# Patient Record
Sex: Female | Born: 1935 | Race: White | Hispanic: No | Marital: Married | Smoking: Never smoker
Health system: Southern US, Community
[De-identification: ages and names within clinical notes are randomized; demographics above are authoritative.]

## PROBLEM LIST (undated history)

## (undated) DIAGNOSIS — M25519 Pain in unspecified shoulder: Secondary | ICD-10-CM

## (undated) DIAGNOSIS — I3139 Other pericardial effusion (noninflammatory): Secondary | ICD-10-CM

## (undated) DIAGNOSIS — K219 Gastro-esophageal reflux disease without esophagitis: Secondary | ICD-10-CM

## (undated) DIAGNOSIS — I351 Nonrheumatic aortic (valve) insufficiency: Secondary | ICD-10-CM

## (undated) DIAGNOSIS — R030 Elevated blood-pressure reading, without diagnosis of hypertension: Secondary | ICD-10-CM

## (undated) DIAGNOSIS — E785 Hyperlipidemia, unspecified: Secondary | ICD-10-CM

## (undated) DIAGNOSIS — I447 Left bundle-branch block, unspecified: Secondary | ICD-10-CM

## (undated) DIAGNOSIS — I313 Pericardial effusion (noninflammatory): Secondary | ICD-10-CM

## (undated) HISTORY — DX: Nonrheumatic aortic (valve) insufficiency: I35.1

## (undated) HISTORY — DX: Other pericardial effusion (noninflammatory): I31.39

## (undated) HISTORY — DX: Pain in unspecified shoulder: M25.519

## (undated) HISTORY — DX: Elevated blood-pressure reading, without diagnosis of hypertension: R03.0

## (undated) HISTORY — DX: Gastro-esophageal reflux disease without esophagitis: K21.9

## (undated) HISTORY — DX: Left bundle-branch block, unspecified: I44.7

## (undated) HISTORY — DX: Hyperlipidemia, unspecified: E78.5

## (undated) HISTORY — PX: MELANOMA EXCISION: SHX5266

## (undated) HISTORY — DX: Pericardial effusion (noninflammatory): I31.3

---

## 1981-10-30 HISTORY — PX: BREAST BIOPSY: SHX20

## 1982-10-30 HISTORY — PX: CHOLECYSTECTOMY: SHX55

## 1991-10-31 HISTORY — PX: NASAL SEPTUM SURGERY: SHX37

## 1998-10-30 HISTORY — PX: 25 GAUGE PARS PLANA VITRECTOMY WITH 20 GAUGE MVR PORT FOR MACULAR HOLE: SHX6096

## 2000-10-30 HISTORY — PX: ROTATOR CUFF REPAIR: SHX139

## 2006-08-15 ENCOUNTER — Ambulatory Visit: Payer: Self-pay | Admitting: Cardiology

## 2006-08-15 ENCOUNTER — Observation Stay (HOSPITAL_COMMUNITY): Admission: EM | Admit: 2006-08-15 | Discharge: 2006-08-16 | Payer: Self-pay | Admitting: Emergency Medicine

## 2006-08-30 ENCOUNTER — Ambulatory Visit: Payer: Self-pay

## 2006-08-30 ENCOUNTER — Encounter: Payer: Self-pay | Admitting: Cardiology

## 2006-09-07 ENCOUNTER — Ambulatory Visit: Payer: Self-pay | Admitting: Cardiology

## 2007-09-13 ENCOUNTER — Ambulatory Visit: Payer: Self-pay | Admitting: Cardiology

## 2008-03-06 ENCOUNTER — Encounter: Admission: RE | Admit: 2008-03-06 | Discharge: 2008-03-06 | Payer: Self-pay | Admitting: Family Medicine

## 2008-08-11 ENCOUNTER — Encounter: Admission: RE | Admit: 2008-08-11 | Discharge: 2008-08-11 | Payer: Self-pay | Admitting: Family Medicine

## 2008-08-16 ENCOUNTER — Encounter: Admission: RE | Admit: 2008-08-16 | Discharge: 2008-08-16 | Payer: Self-pay | Admitting: Family Medicine

## 2008-09-18 ENCOUNTER — Ambulatory Visit: Payer: Self-pay | Admitting: Cardiology

## 2008-10-06 ENCOUNTER — Ambulatory Visit: Payer: Self-pay

## 2008-10-06 ENCOUNTER — Encounter: Payer: Self-pay | Admitting: Cardiology

## 2009-01-04 ENCOUNTER — Ambulatory Visit: Payer: Self-pay | Admitting: Cardiology

## 2010-01-03 ENCOUNTER — Encounter: Payer: Self-pay | Admitting: Cardiology

## 2010-01-03 DIAGNOSIS — I447 Left bundle-branch block, unspecified: Secondary | ICD-10-CM | POA: Insufficient documentation

## 2010-01-04 ENCOUNTER — Ambulatory Visit: Payer: Self-pay | Admitting: Cardiology

## 2010-01-06 ENCOUNTER — Telehealth (INDEPENDENT_AMBULATORY_CARE_PROVIDER_SITE_OTHER): Payer: Self-pay | Admitting: *Deleted

## 2010-01-10 ENCOUNTER — Ambulatory Visit: Payer: Self-pay | Admitting: Cardiology

## 2010-01-10 ENCOUNTER — Encounter (HOSPITAL_COMMUNITY): Admission: RE | Admit: 2010-01-10 | Discharge: 2010-03-01 | Payer: Self-pay | Admitting: Cardiology

## 2010-01-10 ENCOUNTER — Ambulatory Visit: Payer: Self-pay

## 2010-02-12 ENCOUNTER — Encounter: Payer: Self-pay | Admitting: Cardiology

## 2010-02-14 ENCOUNTER — Ambulatory Visit: Payer: Self-pay | Admitting: Cardiology

## 2010-11-29 NOTE — Progress Notes (Signed)
Summary: Nuclear Pre-Procedure  Phone Note Outgoing Call   Call placed by: Milana Na, EMT-P,  January 06, 2010 10:30 AM Summary of Call: Left message with information on Myoview Information Sheet (see scanned document for details).      Nuclear Med Background Indications for Stress Test: Evaluation for Ischemia   History: Echo  History Comments: '09 ECHO NL LVF Mild AI  Symptoms: Chest Pain  Symptoms Comments: Radiating to the jaw   Nuclear Pre-Procedure Cardiac Risk Factors: LBBB Height (in): 66  Nuclear Med Study Referring MD:  [employer]

## 2010-11-29 NOTE — Assessment & Plan Note (Signed)
Summary: Cardiology Nuclear Study  Nuclear Med Background Indications for Stress Test: Evaluation for Ischemia   History: Echo  History Comments: '09 ECHO NL LVF Mild AI  Symptoms: Chest Pain  Symptoms Comments: Radiating to the jaw   Nuclear Pre-Procedure Cardiac Risk Factors: LBBB Caffeine/Decaff Intake: None NPO After: 8:00 PM Lungs: clear IV 0.9% NS with Angio Cath: 22g     IV Site: (R) AC IV Started by: Irean Hong RN Chest Size (in) 36     Cup Size B     Height (in): 66 Weight (lb): 134 BMI: 21.71 Tech Comments: Manual cuff BP (R) 150/90, (L) 140/80. Patient's automatic BP cuff (R) 138/89, (L) 140/92.  Recheck manual BP cuff (L) 140/84, patient's automatic BP cuff (L) 140/88.Patsy Edwards,RN.  Nuclear Med Study 1 or 2 day study:  1 day     Stress Test Type:  Adenosine Reading MD:  Willa Rough, MD     Referring MD:  J.Kloi Brodman Resting Radionuclide:  Technetium 65m Tetrofosmin     Resting Radionuclide Dose:  11.0 mCi  Stress Radionuclide:  Technetium 23m Tetrofosmin     Stress Radionuclide Dose:  33.0 mCi   Stress Protocol  Dose of Adenosine:  34.1 mg    Stress Test Technologist:  Milana Na EMT-P     Nuclear Technologist:  Burna Mortimer Deal RT-N  Rest Procedure  Myocardial perfusion imaging was performed at rest 45 minutes following the intravenous administration of Myoview Technetium 36m Tetrofosmin.  Stress Procedure  The patient received IV adenosine at 140 mcg/kg/min for 4 minutes. There were no significant changes with infusion.  2nd degree avb with infusion. Myoview was injected at the 2 minute mark and quantitative spect images were obtained after a 45 minute delay.  QPS Raw Data Images:  Patient motion noted; appropriate software correction applied. Stress Images:  There is normal uptake in all areas. Rest Images:  Normal homogeneous uptake in all areas of the myocardium. Subtraction (SDS):  No evidence of ischemia. Transient Ischemic Dilatation:  1.07   (Normal <1.22)  Lung/Heart Ratio:  .29  (Normal <0.45)  Quantitative Gated Spect Images QGS EDV:  81 ml QGS ESV:  29 ml QGS EF:  64 % QGS cine images:  Normal motion  Findings Normal nuclear study      Overall Impression  Exercise Capacity: Adenosine study with no exercise. BP Response: Normal blood pressure response. Clinical Symptoms: chest pressure ECG Impression: LBBB Overall Impression: Normal stress nuclear study.  Appended Document: Cardiology Nuclear Study Good result.  Appended Document: Cardiology Nuclear Study pt aware

## 2010-11-29 NOTE — Miscellaneous (Signed)
  Clinical Lists Changes  Problems: Added new problem of GERD (ICD-530.81) Added new problem of LBBB (ICD-426.3) Added new problem of AORTIC INSUFFICIENCY (ICD-424.1) Added new problem of CHEST PAIN (ICD-786.50) Added new problem of PERICARDIAL EFFUSION (ICD-423.9) Added new problem of * BLOOD PRESSURE Observations: Added new observation of PAST MED HX: GERD LBBB EF  55%  /  55-60%...echo...09/2008...septal dyssynergy AI  mild...echo...08/2006  /  echo...09/2008...mild AI cholecystectomy. left scapular pain...Marland KitchenMarland Kitchenappeared to be radicular in past Question of hypertension.  Her blood pressure historically has     always been quite good.  It appears that it is somewhat elevated     today but after relaxing in the office it was only high normal. Chest discomfort...not cardiac in past Pericardial effusion....echo   09/2008...small...incidental (01/03/2010 13:04) Added new observation of PRIMARY MD: Jarome Matin, MD (01/03/2010 13:04)       Past History:  Past Medical History: GERD LBBB EF  55%  /  55-60%...echo...09/2008...septal dyssynergy AI  mild...echo...08/2006  /  echo...09/2008...mild AI cholecystectomy. left scapular pain...Marland KitchenMarland Kitchenappeared to be radicular in past Question of hypertension.  Her blood pressure historically has     always been quite good.  It appears that it is somewhat elevated     today but after relaxing in the office it was only high normal. Chest discomfort...not cardiac in past Pericardial effusion....echo   09/2008...small...incidental

## 2010-11-29 NOTE — Assessment & Plan Note (Signed)
Summary: f1y   Visit Type:  Follow-up Primary Provider:  Jarome Matin, MD  CC:  chest pain.  History of Present Illness: The patient is seen for followup of chest pain.  She has some chest pain that is random.  At times she has discomfort in her jaw.  We know from the past that she has good left ventricular function with mild aortic insufficiency by echo December, 2009.  She has not had any type of exercise test in the past several years.  She does not have nausea vomiting or shortness of breath with her chest discomfort.  Current Medications (verified): 1)  Fosamax 70 Mg Tabs (Alendronate Sodium) .Marland Kitchen.. 1  Tab Q Weekly 2)  Aspirin 81 Mg Tbec (Aspirin) .... Take One Tablet By Mouth Daily 3)  Multivitamins   Tabs (Multiple Vitamin) .Marland Kitchen.. 1 Tab Once Daily 4)  Calicim W/vit D .... 500mg - 1 Tab Three Times A Day 5)  Sea Cucumber Vitamin 500mg  .... 1 Tab Once Daily 6)  Prilosec 20 Mg Cpdr (Omeprazole) .Marland Kitchen.. 1 Tab  Every Other Day 7)  Alprazolam 0.5 Mg Tabs (Alprazolam) .... As Needed 8)  Fish Oil   Oil (Fish Oil) 750mg  .... 1 Time A Day  Allergies (verified): No Known Drug Allergies  Past History:  Past Medical History: GERD LBBB EF  55%  /  55-60%...echo...09/2008...septal dyssynergy AI  mild...echo...08/2006  /  echo...09/2008...mild AI cholecystectomy. left scapular pain...Marland KitchenMarland Kitchenappeared to be radicular in past Question of hypertension.  Her blood pressure historically has     always been quite good.  It appears that it is somewhat elevated     today but after relaxing in the office it was only high normal. Chest discomfort.. Pericardial effusion....echo   09/2008...small...incidental  Review of Systems       Patient denies fever, chills, headache, sweats, rash, change in vision, change in hearing, shortness of breath, cough, nausea vomiting, urinary symptoms.  All other systems are reviewed and are negative.  Vital Signs:  Patient profile:   75 year old female Height:      66  inches Weight:      135 pounds BMI:     21.87 Pulse rate:   70 / minute BP sitting:   170 / 86  (left arm) Cuff size:   regular  Vitals Entered By: Burnett Kanaris, CNA (January 04, 2010 9:27 AM)  Physical Exam  General:  patient is stable in general. Head:  head is atraumatic. Eyes:  no xanthelasma. Neck:  no jugular venous distention. Chest Wall:  no chest wall tenderness. Lungs:  lungs are clear.  Respiratory effort is not labored. Heart:  cardiac exam reveals an S1 and S2.  There are no clicks or significant murmurs. Abdomen:  abdomen is soft. Msk:  no musculoskeletal deformities. Extremities:  no peripheral edema. Skin:  no skin rashes. Psych:  patient is oriented to person time and place.  Affect is normal.   Impression & Recommendations:  Problem # 1:  * BLOOD PRESSURE Systolic blood pressure is elevated today.  In the past her pressure has been normal at home when elevated in the office.  She will start to take her blood pressure at home and she will bring her blood pressure cuff in for calibration when she returns for her stress test  Problem # 2:  PERICARDIAL EFFUSION (ICD-423.9) Patient had a pericardial effusion there was trivial in the past.  There is no indication for follow up echo at this time.  This  Problem # 3:  CHEST PAIN (ICD-786.50)  Her updated medication list for this problem includes:    Aspirin 81 Mg Tbec (Aspirin) .Marland Kitchen... Take one tablet by mouth daily  Orders: Nuclear Stress Test (Nuc Stress Test) EKG w/ Interpretation (93000)  The patient has some ongoing chest discomfort.  She has left bundle branch block.  This is old.  EKG is done today and reviewed by me.  There is normal sinus rhythm with left bundle branch block.  I feel that we should proceed with nuclear exercise testing.  Adenosine Myoview is the most appropriate study in this patient with left bundle branch block.  The patient also will be checking with her dentist as she has some jaw  discomfort at times.  Problem # 4:  AORTIC INSUFFICIENCY (ICD-424.1) There is no murmur heard today.  She does not need a followup echo at this time.  Problem # 5:  GERD (ICD-530.81)  Her updated medication list for this problem includes:    Prilosec 20 Mg Cpdr (Omeprazole) .Marland Kitchen... 1 tab  every other day Of Her Symptoms May Well Be Related to GERD.  I Will Not Be Changing Her Meds in This Regard.  Other Orders: Misc. Referral (Misc. Ref)  Patient Instructions: 1)  Your physician has requested that you have an adenosine myoview.  For further information please visit [email or web address]  Please follow instruction sheet, as given. 2)  Nurse Visit to compare home BP cuff 3)  Follow up in 6 weeks

## 2010-11-29 NOTE — Assessment & Plan Note (Signed)
Summary: check bp/also check cuff/saf  Nurse Visit   Vital Signs:  Patient profile:   75 year old female Height:      66 inches Weight:      135 pounds BMI:     21.87 BP sitting:   140 / 80  (left arm) Cuff size:   regular  Impression & Recommendations:  Problem # 1:  * BLOOD PRESSURE MANUAL CUFF RA 150/90 LA140/80  PT'S CUFF RA 138/89 LA140/92 WILL FORWARD TO DR Henrietta Hoover DESK TOP FOR REVIEW    Current Medications (verified): 1)  Fosamax 70 Mg Tabs (Alendronate Sodium) .Marland Kitchen.. 1  Tab Q Weekly 2)  Aspirin 81 Mg Tbec (Aspirin) .... Take One Tablet By Mouth Daily 3)  Multivitamins   Tabs (Multiple Vitamin) .Marland Kitchen.. 1 Tab Once Daily 4)  Calicim W/vit D .... 500mg - 1 Tab Three Times A Day 5)  Sea Cucumber Vitamin 500mg  .... 1 Tab Once Daily 6)  Prilosec 20 Mg Cpdr (Omeprazole) .Marland Kitchen.. 1 Tab  Every Other Day 7)  Alprazolam 0.5 Mg Tabs (Alprazolam) .... As Needed 8)  Vitamin D 50,000 Iu .Marland Kitchen.. 1 Every Month 9)  Omega-3 750 Mg .Marland Kitchen.. 1 Once Daily  Allergies: No Known Drug Allergies   Patient Instructions: 1)  Your physician recommends that you schedule a follow-up appointment in: AS SCHEDULED  2)  Your physician has requested that you regularly monitor and record your blood pressure readings at home.  Please use the same machine at the same time of day to check your readings and record them to bring to your follow-up visit. IF CONSISTENTLY RUNS 140/85 OR ABOVE  CALL OFFICE 3)  Your physician recommends that you continue on your current medications as directed. Please refer to the Current Medication list given to you today.

## 2010-11-29 NOTE — Assessment & Plan Note (Signed)
Summary: per check out/sf   Visit Type:  Follow-up Primary Provider:  Jarome Matin, MD  CC:  chest pain.  History of Present Illness: The patient is seen for followup of chest pain.  Also because her blood pressure was mildly elevated at the time of her last visit, she brought a list of blood pressures for the past month.  It is very clear that her blood pressure is normal at home.  No further evaluation is needed.  She has not had any recurring chest pain.  Because she has a left bundle branch block her nuclear study was done using adenosine.  The study was dated January 10, 2010.  It was normal.  There was no scar or ischemia.  Current Medications (verified): 1)  Fosamax 70 Mg Tabs (Alendronate Sodium) .Marland Kitchen.. 1  Tab Q Weekly 2)  Aspirin 81 Mg Tbec (Aspirin) .... Take One Tablet By Mouth Daily 3)  Multivitamins   Tabs (Multiple Vitamin) .Marland Kitchen.. 1 Tab Once Daily 4)  Calicim W/vit D .... 770mg  W/ 1000 Vit D3 -- Three Times A Day 5)  Sea Cucumber Vitamin 500mg  .... 1 Tab Once Daily 6)  Prilosec 20 Mg Cpdr (Omeprazole) .Marland Kitchen.. 1 Tab  Every Other Day 7)  Alprazolam 0.5 Mg Tabs (Alprazolam) .... As Needed 8)  Vitamin D 50,000 Iu .Marland Kitchen.. 1 Every Month 9)  Omega-3 750 Mg .Marland Kitchen.. 1 Once Daily 10)  Caltrate 600+d Plus 600-400 Mg-Unit Chew (Calcium Carbonate-Vit D-Min) .... Once Daily  Allergies (verified): No Known Drug Allergies  Past History:  Past Medical History: GERD LBBB EF  55%  /  55-60%...echo...09/2008...septal dyssynergy AI  mild...echo...08/2006  /  echo...09/2008...mild AI cholecystectomy. left scapular pain...Marland KitchenMarland Kitchenappeared to be radicular in past Question of hypertension.  Her blood pressure historically has     always been quite good.  It appears that it is somewhat elevated     today but after relaxing in the office it was only high normal.  /  resolved February 14, 2010, recorded blood pressures from home for one month are all normal. Chest  pain...nuclear.Marland KitchenMarland Kitchen3/14/2011...normal...64% Pericardial effusion....echo   09/2008...small...incidental  Review of Systems       Patient denies fever, chills, headache, sweats, rash, change in vision, change in hearing, chest pain, cough, nausea vomiting, urinary symptoms.  Vital Signs:  Patient profile:   75 year old female Height:      66 inches Weight:      134 pounds BMI:     21.71 Pulse rate:   65 / minute BP sitting:   124 / 66  (left arm) Cuff size:   regular  Vitals Entered By: Hardin Negus, RMA (February 14, 2010 9:27 AM)  Physical Exam  General:  patient is stable. Eyes:  no xanthelasma. Neck:  no jugular venous distention. Lungs:  lungs are clear.  Respiratory effort is nonlabored. Heart:  cardiac exam reveals S1 and S2.  No clicks or significant murmurs. Abdomen:  abdomen soft. Extremities:  no peripheral edema. Psych:  patient is oriented to person time and place.  Affect is normal.   Impression & Recommendations:  Problem # 1:  * BLOOD PRESSURE Blood pressure is normal.  As outlined in the history of present illness her blood pressure is normal at home.  No further workup recommended.  Problem # 2:  AORTIC INSUFFICIENCY (ICD-424.1) I do not hear any significant aortic insufficiency today.  No further workup at this time.  Problem # 3:  CHEST PAIN (ICD-786.50)  Her updated medication  list for this problem includes:    Aspirin 81 Mg Tbec (Aspirin) .Marland Kitchen... Take one tablet by mouth daily Her nuclear study is normal.  No further workup.  Patient Instructions: 1)  Follow up in 1 year

## 2010-11-29 NOTE — Miscellaneous (Signed)
  Clinical Lists Changes  Observations: Added new observation of PAST MED HX: GERD LBBB EF  55%  /  55-60%...echo...09/2008...septal dyssynergy AI  mild...echo...08/2006  /  echo...09/2008...mild AI cholecystectomy. left scapular pain...Marland KitchenMarland Kitchenappeared to be radicular in past Question of hypertension.  Her blood pressure historically has     always been quite good.  It appears that it is somewhat elevated     today but after relaxing in the office it was only high normal. Chest pain...nuclear.Marland KitchenMarland Kitchen3/14/2011...normal...64% Pericardial effusion....echo   09/2008...small...incidental (02/12/2010 14:41) Added new observation of PRIMARY MD: Jarome Matin, MD (02/12/2010 14:41)       Past History:  Past Medical History: GERD LBBB EF  55%  /  55-60%...echo...09/2008...septal dyssynergy AI  mild...echo...08/2006  /  echo...09/2008...mild AI cholecystectomy. left scapular pain...Marland KitchenMarland Kitchenappeared to be radicular in past Question of hypertension.  Her blood pressure historically has     always been quite good.  It appears that it is somewhat elevated     today but after relaxing in the office it was only high normal. Chest pain...nuclear.Marland KitchenMarland Kitchen3/14/2011...normal...64% Pericardial effusion....echo   09/2008...small...incidental

## 2011-02-17 ENCOUNTER — Encounter: Payer: Self-pay | Admitting: Cardiology

## 2011-02-17 DIAGNOSIS — I313 Pericardial effusion (noninflammatory): Secondary | ICD-10-CM | POA: Insufficient documentation

## 2011-02-17 DIAGNOSIS — R072 Precordial pain: Secondary | ICD-10-CM | POA: Insufficient documentation

## 2011-02-17 DIAGNOSIS — R079 Chest pain, unspecified: Secondary | ICD-10-CM | POA: Insufficient documentation

## 2011-02-17 DIAGNOSIS — I351 Nonrheumatic aortic (valve) insufficiency: Secondary | ICD-10-CM | POA: Insufficient documentation

## 2011-02-17 DIAGNOSIS — I3139 Other pericardial effusion (noninflammatory): Secondary | ICD-10-CM | POA: Insufficient documentation

## 2011-02-17 DIAGNOSIS — R9430 Abnormal result of cardiovascular function study, unspecified: Secondary | ICD-10-CM | POA: Insufficient documentation

## 2011-02-17 DIAGNOSIS — M25519 Pain in unspecified shoulder: Secondary | ICD-10-CM | POA: Insufficient documentation

## 2011-02-18 ENCOUNTER — Encounter: Payer: Self-pay | Admitting: Cardiology

## 2011-02-20 ENCOUNTER — Encounter: Payer: Self-pay | Admitting: Cardiology

## 2011-02-20 ENCOUNTER — Ambulatory Visit (INDEPENDENT_AMBULATORY_CARE_PROVIDER_SITE_OTHER): Payer: Medicare Other | Admitting: Cardiology

## 2011-02-20 DIAGNOSIS — K219 Gastro-esophageal reflux disease without esophagitis: Secondary | ICD-10-CM | POA: Insufficient documentation

## 2011-02-20 DIAGNOSIS — I319 Disease of pericardium, unspecified: Secondary | ICD-10-CM

## 2011-02-20 DIAGNOSIS — I313 Pericardial effusion (noninflammatory): Secondary | ICD-10-CM

## 2011-02-20 DIAGNOSIS — R0989 Other specified symptoms and signs involving the circulatory and respiratory systems: Secondary | ICD-10-CM

## 2011-02-20 DIAGNOSIS — I3139 Other pericardial effusion (noninflammatory): Secondary | ICD-10-CM

## 2011-02-20 DIAGNOSIS — R9430 Abnormal result of cardiovascular function study, unspecified: Secondary | ICD-10-CM

## 2011-02-20 DIAGNOSIS — R079 Chest pain, unspecified: Secondary | ICD-10-CM

## 2011-02-20 NOTE — Assessment & Plan Note (Signed)
Aortic insufficiency is mild historically.  We will consider a followup echo next year.

## 2011-02-20 NOTE — Progress Notes (Signed)
HPI Patient is seen for cardiology followup.  She's had chest pain in the past but a normal nuclear scan.  She has GERD.  When she takes her medications she feels fine.  Sometimes when she is having GERD symptoms she feels slight discomfort in her right lower jaw.  We have discussed this and I feel that this is not angina for her.  She does not follow activities.  She has no symptoms with activities. No Known Allergies  Current Outpatient Prescriptions  Medication Sig Dispense Refill  . ALPRAZolam (XANAX) 0.5 MG tablet Take 0.5 mg by mouth as needed.        Marland Kitchen aspirin 81 MG EC tablet Take 81 mg by mouth daily.        . Calcium Carbonate-Vit D-Min (CALTRATE 600+D PLUS) 600-400 MG-UNIT per tablet Take 1 tablet by mouth daily.        . Multiple Vitamin (MULTIVITAMIN) tablet Take 1 tablet by mouth daily.        . NON FORMULARY Sea Cucumber Vit 500mg  - 1 tablet daily       . NON FORMULARY Probiotic 1 per day       . Omega-3 Fatty Acids (FISH OIL PO) Take 1 tablet by mouth daily.        Marland Kitchen omeprazole (PRILOSEC) 20 MG capsule Take 20 mg by mouth every other day.        . Vitamin D, Ergocalciferol, (DRISDOL) 50000 UNITS CAPS 1 EVERY MONTH       . CALCIUM-VITAMIN D PO 770mg  w/ 1000 vit D3 -- three times a day       . DISCONTD: alendronate (FOSAMAX) 70 MG tablet Take 70 mg by mouth every 7 (seven) days. Take with a full glass of water on an empty stomach.         History   Social History  . Marital Status: Unknown    Spouse Name: N/A    Number of Children: N/A  . Years of Education: N/A   Occupational History  . Not on file.   Social History Main Topics  . Smoking status: Never Smoker   . Smokeless tobacco: Not on file  . Alcohol Use: Not on file  . Drug Use: Not on file  . Sexually Active: Not on file   Other Topics Concern  . Not on file   Social History Narrative  . No narrative on file    No family history on file.  Past Medical History  Diagnosis Date  . GERD  (gastroesophageal reflux disease)     Patient sometimes has slight right lower jaw discomfort at the time of her reflux  . LBBB (left bundle branch block)   . Ejection fraction     55-60%, echo, and December, 2009, septal dyssynergy  . Aortic insufficiency     Mild, echo, November, 2007 /mild, echo, December, 2009  . Shoulder pain     Left scapular pain, appeared to be radicular  . Blood pressure alteration     Historically pressure normal, April I. normal blood pressure in the office / recorded blood pressures from home normal  . Chest pain     Nuclear March, 2011, normal, ejection fraction 64%  . Pericardial effusion     Echo December, 2009, small, incidental    Past Surgical History  Procedure Date  . Cholecystectomy     ROS  Patient denies fever, chills, headache, sweats, rash, change in vision, change in hearing, chest pain, cough, nausea vomiting,  urinary symptoms.  All other systems are reviewed and are negative.  PHYSICAL EXAM Patient is oriented to person time and place.  Affect is normal.  Head is atraumatic.  There is no xanthelasma.  There are no carotid bruits.  There is no jugular venous distention.  Lungs are clear.  Respiratory effort is nonlabored.  Heart exam reveals S1-S2.  There is a soft systolic murmur.  I do not hear a murmur of aortic insufficiency.  Abdomen is soft.  There is no peripheral edema.  There are no musculoskeletal deformities.  There no skin rashes. Filed Vitals:   02/20/11 0837  BP: 150/80  Pulse: 63  Resp: 18  Height: 5\' 6"  (1.676 m)  Weight: 135 lb (61.236 kg)    EKG is done today.  I personally reviewed EKG.  There is old interventricular conduction delay.  ASSESSMENT & PLAN

## 2011-02-20 NOTE — Assessment & Plan Note (Signed)
The patient is not having any significant chest discomfort.  She had a nuclear exercise test in 2011.  There was no ischemia.  No further workup needed.  EKG is done today.  There is old and a ventricular conduction delay.

## 2011-02-20 NOTE — Assessment & Plan Note (Signed)
Patient has GERD.  She has symptoms when she's not taking her medication.  She also has an interesting finding in that she has some discomfort in one tooth when she has her GERD symptoms.  She does not have this when she exercises.  I am not convinced that this represents angina

## 2011-02-20 NOTE — Assessment & Plan Note (Signed)
No evidence of return of her pericardial effusion.  She does not need a followup echo at this time.

## 2011-02-20 NOTE — Patient Instructions (Signed)
Your physician recommends that you schedule a follow-up appointment in: 12 months with Dr Katz 

## 2011-03-14 NOTE — Assessment & Plan Note (Signed)
[facility]                            CARDIOLOGY OFFICE NOTE   Sharen Heck                          MRN:          [id number]  DATE:09/13/2007                            DOB:          1936-09-20    Ms. Lipham is doing well.  She has mild aortic insufficiency, and she  has left bundle branch block.  I had seen her in the past and she had  some discomfort that turned out to be radicular pain in the left scapula  area.  She followed for a while with Dr. Pearlean Brownie and no longer needs  neurologic followup at this point and she is feeling well.  She has been  on Lyrica for a period of time, but she is no longer on this.   PAST MEDICAL HISTORY:   ALLERGIES:  No known drug allergies.   MEDICATIONS:  Fosamax, aspirin, multivitamin, supplements, vitamin D.   OTHER MEDICAL PROBLEMS:  See the list below.   REVIEW OF SYSTEMS:  She is feeling well.  She is not having any chest  pain.  There has been no syncope or presyncope.  Her review of systems  is negative.   PHYSICAL EXAMINATION:  VITAL SIGNS:  Blood pressure today on initial  measurement was 160/94, with repeat it was 145/89.  She will be seeing  Dr. Artis Flock soon and there will be more blood pressure checks.  Pulse is  86.  NEUROLOGIC:  The patient is oriented to person, time, and place.  Affect  is normal.  HEENT:  Reveals no xanthelasma. She has normal extraocular motion.  NECK:  There are no carotid bruits.  There is no jugular venous  distention.  LUNGS:  Clear.  Respiratory effort is not labored.  CARDIAC:  Reveals an S1 with an S2.  I do not hear her aortic  insufficiency today.  ABDOMEN:  Soft.  There are no masses or bruits.  EXTREMITIES:  She has no peripheral edema.   EKG revealed old left bundle branch block.   PROBLEMS:  1. History of gastroesophageal reflux disease.  2. Left bundle branch block, old.  3. Ejection fraction 55%.  4. Mild aortic insufficiency by her followup  echocardiogram in      November 2007.  She does not need an echocardiogram at this time.  5. Status post cholecystectomy.  6. Status post left scapular pain that appeared to be radicular in      origin and is now currently resolved.  7. Question of hypertension.  Her blood pressure historically has      always been quite good.  It appears that it is somewhat elevated      today but after relaxing in the office it was only high normal.      She will see Dr. Artis Flock for blood pressure followup check.   I will see her back in one year for cardiology followup.     Luis Abed, MD, [facility]  Electronically Signed    JDK/MedQ  DD: 09/13/2007  DT: 09/13/2007  Job #: [id number]   cc:  Quita Skye Artis Flock, M.D.

## 2011-03-14 NOTE — Assessment & Plan Note (Signed)
[facility]                            CARDIOLOGY OFFICE NOTE   LISVET, RASHEED                         MRN:          [id number]  DATE:09/18/2008                            DOB:          1936-01-28    Althea Charon (previously Lipham)   Ms. Lawhorne is doing very well.  She has aortic insufficiency.  She also  had chest pain in the past.  It was thought that ultimately that her  pain was a radicular pain from her left scapula.  She is fully active.  She is not having any chest pain or shortness of breath.  She has had no  syncope or presyncope.  She is going about full activities.  She does  have a left bundle-branch block that is old.  Her ejection fraction has  been low normal in the past.  Her last echo was done in November 2007.  There has also been a question of hypertension, but this is not an issue  at this point.   PAST MEDICAL HISTORY:   ALLERGIES:  No known drug allergies.   MEDICATIONS:  Vitamin D, Fosamax, aspirin, multivitamin, calcium,  magnesium, and Prilosec.   OTHER MEDICAL PROBLEMS:  See the complete list below.   REVIEW OF SYSTEMS:  She is not having any GI or GU symptoms.  She is not  having any fevers, chills, or headaches.  There are no rashes.  Otherwise, her review of systems is negative.   PHYSICAL EXAMINATION:  VITAL SIGNS:  Weight is 132 pounds, which is  stable.  Blood pressure is 126/82 with a pulse of 65.  GENERAL:  The patient is oriented to person, time, and place.  Affect is  normal.  HEENT:  No xanthelasma.  She has normal extraocular motion.  NECK:  There are no carotid bruits.  There is no jugular venous  distention.  LUNGS:  Clear.  Respiratory effort is not labored.  CARDIAC:  S1 with an S2.  There is a 2/6 systolic murmur.  The patient  has mild aortic insufficiency heard today.  ABDOMEN:  Soft.  EXTREMITIES:  She has no significant peripheral edema.   EKG reveals old left bundle-branch block.   PROBLEMS:  1. History of gastroesophageal reflux disease.  2. Old left bundle-branch block.  3. Ejection fraction by history in the 50-55% range.  Her left bundle      may, of course, affect this.  4. Mild aortic insufficiency.  It is now been 2 years since her last      echo.  She does need a 2-D echo to reassess her left ventricular      function and her aortic insufficiency at this time and this will be      scheduled.  5. History of cholecystectomy.  6. History of scapular pain that was thought to be radicular at that      time.  7. Question of hypertension in the past and this is not an ongoing      issue at this point.  The patient is stable.  We will obtain a  2-D      echo.  I will see her back for Cardiology followup in 1 year.     Luis Abed, MD, [facility]  Electronically Signed    JDK/MedQ  DD: 09/18/2008  DT: 09/18/2008  Job #: [id number]

## 2011-03-14 NOTE — Assessment & Plan Note (Signed)
[facility]                            CARDIOLOGY OFFICE NOTE   Julia [name], Julia [name]                         MRN:          [id number]  DATE:01/04/2009                            DOB:          January 06, 1936    Julia [name] is seen for cardiology followup.  I had seen her in November  2009.  She has mild aortic insufficiency.  She has been having some  chest discomfort.  Her primary physician now is Dr. Jarome Matin.  He  was concerned about chest pain, she was having and he asked that she  will be seen in followup.  She is stable at this time.  Her chest  discomfort has been somewhat random.  It may well be GI in origin.  Dr.  Eloise Harman started omeprazole and she has not had any significant symptoms  since then.  She has not had any exertional symptoms.   PAST MEDICAL HISTORY:   ALLERGIES:  No known drug allergies.   MEDICATIONS:  Vitamin D, Fosamax, aspirin, multivitamin, calcium, and  omeprazole.   OTHER MEDICAL PROBLEMS:  See the list below.   REVIEW OF SYSTEMS:  She is not having any GI or GU complaints.  She has  no fevers or chills.  There are no skin rashes.  Her review of systems  otherwise is negative.   PHYSICAL EXAMINATION:  VITAL SIGNS:  Blood pressure is 110/70 with pulse  of 87.  GENERAL:  The patient is oriented to person, time, and place.  Affect is  normal.  HEENT:  No xanthelasma.  She has normal extraocular motion.  There are  no carotid bruits.  There is no jugular venous distention.  LUNGS:  Clear.  Respiratory effort is not labored.  CARDIAC:  S1 with an S2.  There are no clicks or significant murmurs.  There is a 2/6 systolic ejection murmur.  There is mild aortic  insufficiency heard.  ABDOMEN:  Soft.  She has no significant peripheral edema.   PROBLEMS:  1. Gastroesophageal reflux disease.  2. Old left bundle branch block.  3. Ejection fraction 55-60% by echo October 06, 2008.  Mild aortic      insufficiency by echo  October 06, 2008.  4. Mild mitral regurgitation.  5. Status post cholecystectomy.  6. History of scapular pain that was thought to be radicular in the      past.  7. Question of hypertension over time, but she clearly is not      hypertensive at this point on her current medicines.  8. Some chest discomfort.  I believe that this is not cardiac in      origin.  I feel that further workup is not needed.  I will see her      for cardiology follow up in 1 year.     Luis Abed, MD, [facility]  Electronically Signed    JDK/MedQ  DD: 01/04/2009  DT: 01/05/2009  Job #: [id number]   cc:   Barry Dienes. Eloise Harman, M.D.

## 2011-03-17 NOTE — Discharge Summary (Signed)
NAMEAnnetta [name] NO.:  [id number]   MEDICAL RECORD NO.:  [id number]          PATIENT TYPE:  INP   LOCATION:  6524                         FACILITY:  [facility]   PHYSICIAN:  Luis Abed, MD, FACCDATE OF BIRTH:  Oct 17, 1936   DATE OF ADMISSION:  08/15/2006  DATE OF DISCHARGE:  08/16/2006                           DISCHARGE SUMMARY - REFERRING   DISCHARGE DIAGNOSIS:  Back discomfort secondary to musculoskeletal  injury  and history of radiculopathy.   History as mentioned below.   SUMMARY OF HISTORY:  [name] is an 75 year old white female who is  referred to the emergency room with back and arm discomfort and a new left  bundle branch block, from Dr. Blair Heys .  According to the patient, she  states that she twisted her upper body and developed focal left upper back  knife-like discomfort radiating to her left arm and her left hand on August 11, 2006.  She has also noted some tingling in her left hand.  Her  discomfort has been intermittent and worse with movement.  This morning upon  awakening, she noted 9/10 left scapular shoulder discomfort which again she  described as knife-like.  After walking around, she felt slightly better.  However, when she awoke again, she was having more discomfort.  She sought  evaluation with her primary care physician, thus referred to the ER.   PAST MEDICAL HISTORY:  1. Aortic sclerosis and aortic insufficiency diagnosed in 1983, and has      been followed by a cardiologist in [place].  However, she has not seen      anybody in over a year and half.  2. She had a normal thallium stress test in 1983.  3. Cervical disk disease with radiculopathy.   She has never smoked.   LABORATORY:  Admission weight was 60.4 kg.  H&H was 15.1 of 44.6, normal  indices, platelets 347, WBCs 7.6.  PTT 29, PT 13.3.  Sodium 137, potassium  3.3, BUN 11, creatinine 0.9.  Normal LFTs.  Glucose 124.  CK-MB, relative  index, and troponin were  negative for myocardial infarction.  Fasting lipids  showed a total cholesterol 248, triglycerides 65, HDL of 77, LDL 158.  TSH  was 2.019.  EKG showed normal sinus rhythm, left axis deviation, left bundle  branch block.  A chest CT was performed that did not show any evidence of  pulmonary embolism or dissection.  An MRI was performed, showed multilevel  DDD, cord compression at 3 and 4.   HOSPITAL COURSE:  [name] was evaluated by cardiology who recommended an  MRI of her spine.  She was admitted on her home medications as well as  Lopressor for hypertension.  By August 17, 2006, she felt much better.  She  continued to have back discomfort but not as bad, and she wanted to go home.  Dr. Riley Kill reviewed the MRI and according to his verbal interpretation, he  felt that she could be discharged home on a steroid Dosepak and some pain  medications with outpatient followup.  Dr. Myrtis Ser also reviewed  and from a  cardiac standpoint, he felt that she would also be discharged home.  The  patient provided some copies of records from [place] and it was felt that  her bundle branch block has not been new.  Her last echocardiogram was also  in January 2006.  These records will be provided to the office.   DISPOSITION:  [name] is discharged home.  She is asked to maintain low  salt fat cholesterol diet.  Her activities are not restricted.  Wound care  is not applicable.  She received a new prescription for Toprol XL 25 mg for  hypertension, as well as 30 tablets of Percocet, and a Medrol Dosepak.   She was asked to continue her:  1. Fosamax unknown dosage every week.  2. Aspirin 81 mg every day.  3. Multivitamin every day.  4. She has multiple supplements which include magnesium, glucosamine,      selenium, Omega-3, sea cucumber, Tums, Skelaxin, and Tramadol that she      was advised that she may continue.   1. She will have an echocardiogram on October 29 at 1 p.m. at Dr. Henrietta Hoover       office for follow-up of her aortic insufficiency and aortic sclerosis.  2. She will follow up with Dr. Myrtis Ser on November 9 at 3 p.m.  3. She was provided Dr. Marlis Edelson phone number and asked to call for a      followup appointment for followup on her back and neck discomfort.  4. She will follow up with Dr. Artis Flock as scheduled.   DISCHARGE TIME:  Greater than 30 minutes.     ______________________________  Joellyn Rued, PA-C    ______________________________  Luis Abed, MD, [facility]    EW/MEDQ  D:  08/16/2006  T:  08/17/2006  Job:  [id number]   cc:   Quita Skye. Artis Flock, M.D.  Pearlean Brownie, Dr.

## 2011-03-17 NOTE — Consult Note (Signed)
NAME:  Julia [name] NO.:  [id number]   MEDICAL RECORD NO.:  [id number]          PATIENT TYPE:  EMS   LOCATION:  MAJO                         FACILITY:  [facility]   PHYSICIAN:  Pramod P. Pearlean Brownie, MD    DATE OF BIRTH:  1936-02-13   DATE OF CONSULTATION:  08/15/2006  DATE OF DISCHARGE:                                   CONSULTATION   REFERRING PHYSICIAN:  Willa Rough, MD   REASON FOR REFERRAL:  Left arm and neck pain.   HISTORY OF PRESENT ILLNESS:  Julia [name] is a 75 year old Caucasian lady who  developed sudden onset of mid thoracic back pain since last Saturday and  then left arm and forearm pain since the last 2 days.  The patient states  she was felt a sudden muscle pulling in the left side of the mid-thoracic  region.  She took some pain medication that did not work.  She saw Dr.  Blair Heys PA in the office on Monday and was given some muscle relaxants,  which did not work.  Over the last couple of days, however, the pain has  increased.  Now she describes sharp shooting pain going down the lateral  aspect of the left shoulder and forearm.  She denies significant pain in the  neck shooting down her spine and shooting into her arm.  Mid thoracic back  pain has persisted.  She states she has a previous history of known cervical  disk herniation 4 years ago for which she had shooting pain down the left  upper extremity and lateral 2 fingers with tingling and numbness.  She was  treated conservatively at that time.  She denies any recent neck injury,  fall, any lifting of weights or sudden muscle exertion which precipitated  this.  She had an EKG which showed a left bundle branch block, raising  concern for active coronary ischemia; hence, she has been admitted to the  Cardiology service.   PAST MEDICAL HISTORY:  1. Significant for degenerative C-spine disease as stated above.  2. Osteoporosis.   HOME MEDICATIONS:  Fosamax, aspirin, magnesium, multivitamin, omega-3  fish  oil, Tums and glucosamine chondroitin.   SOCIAL HISTORY:  The patient lives in [place].  She moved from [place].  She lives with her significant other.  She has a couple of wines on  weekends.  She does not smoke.   FAMILY HISTORY:  Noncontributory.   REVIEW OF SYSTEMS:  Positive for only back pain, neck pain and numbness.   PHYSICAL EXAM:  GENERAL:  A pleasant middle-age Caucasian lady who is not in  distress.  VITAL SIGNS:  She is afebrile.  Pulse rate is 78 per minute, regular.  Respiratory rate is 16 per minute.  Blood pressure 159/97.  EXTREMITIES:  Distal pulses are well felt.  Both radial pulses are equal  bilaterally.  HEAD:  Nontraumatic.  NECK:  Supple without bruit.  ENT:  Exam unremarkable.  CARDIAC:  No murmur or gallop.  LUNGS:  Clear to auscultation.  ABDOMEN:  Soft and nontender.  NEUROLOGICAL:  The patient is awake,  alert, oriented x3 with normal speech  and language function.  There is no aphasia, apraxia or dysarthria.  Eye  movements are full range without nystagmus.  Visual acuity and fields  adequate.  Face is symmetric.  Palatal movements are normal.  Tongue is  midline.  Motor system exam reveals symmetric upper and lower extremity  strength, tone.  Reflexes are brisk bilaterally, but equal.  Plantars are  downgoing.  Finger-to-nose neutral.  Coordination is accurate.  Sensation is  normal.   DATA REVIEWED:  Two-dimensional echocardiogram on May 21, 2002 shows normal  ejection fraction.   IMPRESSION:  [age]-year-old lady with mid thoracic and left upper  extremity pain, likely radicular pain from thoracic and cervical  radiculopathy.  I doubt this pain is of primary cardiac origin.   PLAN:  I would recommend trial of Lyrica 50 mg twice a day for a week; if  tolerated, increase to 100 mg twice a day.  Check MRI scan of the thoracic  and cervical spine.  If no significant compressive etiology is found, may  consider evaluation for aortic  dissection.  If significant disk herniation  is found, she may benefit with a short course of steroids.  She can  electively follow up with me in the office as an outpatient in a few months.   Thank you for the referral.  Kindly call for questions.           ______________________________  Sunny Schlein. Pearlean Brownie, MD     PPS/MEDQ  D:  08/15/2006  T:  08/17/2006  Job:  [id number]

## 2011-03-17 NOTE — H&P (Signed)
NAMEAnnetta Maw NO.:  [id number]   MEDICAL RECORD NO.:  [id number]          PATIENT TYPE:  INP   LOCATION:  6524                         FACILITY:  [facility]   PHYSICIAN:  Luis Abed, MD, FACCDATE OF BIRTH:  10/02/1936   DATE OF ADMISSION:  08/15/2006  DATE OF DISCHARGE:                                HISTORY & PHYSICAL   PATIENT PROFILE:  A 75 year old white female with no prior history of CAD  who presents with back and arm pain in the setting of a new left bundle-  branch block.   PROBLEM LIST:  1. Left scapular, shoulder, arm pain.  2. Left bundle-branch block, presumed to be new since March 2005.  3. Mild to moderate aortic insufficiency and aortic sclerosis, diagnosed      in 1983.      a.     May 21, 2006, a 2-D echocardiogram performed in [place],       [place], ejection fraction 55%, mild to moderate AI, no AS, unchanged       compared with echo performed in July 1999.  4. Reportedly normal thallium stress test in 1983.  5. Osteoporosis.   HISTORY OF PRESENT ILLNESS:  A 75 year old white female with no prior  history of CAD.  She has a history of aortic sclerosis and mild to moderate  aortic insufficiency followed by serial echos by her cardiologist in  [place], [place].  The last one was performed in July 2003 revealing an EF  of 55% with mild to moderate AI.  She moved to [place] 1 year ago and  did not have a local cardiologist.  On Saturday, August 11, 2006, she  twisted her upper body while in the bathroom and developed focal, 3 to 4 out  of 10, left upper back, knife-like pain with radiation to the left arm and  left-hand tingling.  She had no other associated symptoms.  Pain has been  intermittent since then.  Generally has been worse with movement of he upper  body.  This morning, she awoke at approximately 2:00 a.m. with 9 out of 10  left scapular, shoulder, arm, and elbow, knife-like pain without associated  symptoms.  She  got up and walked around her house and felt somewhat better.  She then laid on the couch and eventually was able to fall off to sleep.  When she awoke later this morning, she continued to have a more mild, 5 out  of 10, discomfort, primarily in her left arm and shoulder, which she notes  was different from what had been occurring over the weekend.  Secondary to  this, she presented to Dr. Markus Jarvis office where an ECG was performed and  showed a left bundle-branch block, which was new for her.  Otherwise, there  were no ST or T changes.  Because of the new left bundle-branch block in the  setting of left scapular, shoulder, and arm pain, she was sent to the [facility] ED for further evaluation.  She continues to complain of 6 out of 10  left arm pain,  but otherwise says she is fairly comfortable sitting up and  reading a book.  She denies any PND, orthopnea, dizziness, syncope, edema,  or early satiety.   ALLERGIES:  NO KNOWN DRUG ALLERGIES.   HOME MEDICATIONS:  1. Fosamax 70 mg weekly.  2. Aspirin 81 mg daily.  3. Magnesium citrate 200 mg b.i.d.  4. Selenium sulfide 100 mcg daily.  5. Multivitamin 1 daily.  6. Chromium picolinate 200 mcg daily.  7. Omega-3 fatty acid 1000 mg b.i.d.  8. __________  500 mg b.i.d.  9. TUMS 1000 mg 1-1/2 tablets daily.  10.Glucosamine and chondroitin daily.   FAMILY HISTORY:  Mother died of pneumonia at age 42.  Father died of COPD  and pneumonia in his 37s.  She has no siblings.   SOCIAL HISTORY:  She lives in [place] with a significant other.  She is a  retired Clinical biochemist.  She denies any tobacco or drug use.  She drinks a  couple of glasses of wine in the weekends.  She goes to the [facility] 3 times per  week, walking on the treadmill for 30 minutes at a time, and also performing  circuit training with weights.  She does not adhere to any sort of a diet.   REVIEW OF SYSTEMS:  Positive for back pain and arm pain as outline the HPI.   Otherwise, all systems reviewed and negative.   PHYSICAL EXAMINATION:  VITAL SIGNS:  Temperature 96.9, heart rate 90,  respirations 20, blood pressure 159/97, pulse ox 97% on room air.  GENERAL:  Pleasant white female in no acute distress.  NEUROLOGIC:  Grossly intact, nonfocal.  HEENT:  Atraumatic, normocephalic.  NECK:  Normal carotid upstrokes.  No bruits or JVD.  LUNGS:  Respirations regular and unlabored.  Clear to auscultation.  CARDIAC:  Regular S1, S2.  No S3, S4, with a 2/6 systolic murmur at the  right upper sternal border.  ABDOMEN:  Round , soft, nontender, nondistended.  Bowel sounds present x4.  EXTREMITIES:  Warm, dry, and pink.  No clubbing, cyanosis, or edema.  Dorsalis pedis, posterior tibia pulses are 2+ and equally bilaterally.  SKIN:  Warm and dry without lesions or masses.   I could not reproduce back or arm pain with palpation.   LABORATORY DATA:  Chest x-ray and lab work is pending.  EKG shows sinus  rhythm with a rate of 71 beats per minute and left bundle-branch block.   ASSESSMENT/PLAN:  1. Back, shoulder, and arm pain.  This has been relatively constant since      Saturday, August 11, 2006, and exacerbated by turning.  This has not      limited activities, otherwise, over the weekend.  The symptoms were      acutely worse at 2:00 a.m. today and are now present on a lower level.      She has no associated symptoms.  We will check blood pressures in both      arms and obtain a chest CT to rule dissection.  Plan to observe and      cycle cardiac markers given new left bundle-branch block on      echocardiogram.  Will check a 2-D echocardiogram to evaluate her aortic      insufficiency was well as wall motion.  Provided that cardiac markers      and echocardiogram are within normal limits, likely would plan to do CT      tomorrow with plans for outpatient functional study.  Of note, she also     has a history of cervical disk disease, which she says was  diagnosed      about 3 or 4 years ago in [place].  She thinks the pain she is      experiencing now is similar to the radiculopathy that she had then.  We      will ask neurology to consult and determine if it would be beneficial      to obtain and MRI at this point or if this can be followed up as an      outpatient.  2. Hypertension.  Blood pressure is currently elevated.  We will add beta-      blocker therapy as she is currently hypertension and we are in the      process of ruling her out.  We will continue her blood pressures and      she may need outpatient blood pressure management.  3. History of mild to moderate aortic insufficiency, diagnosed in 1983.      Her last echocardiogram was in 2003 and we will obtain an      echocardiogram during her admission.  4. Osteoporosis.  She is on Fosamax weekly at home.     ______________________________  Nicolasa Ducking, ANP    ______________________________  Luis Abed, MD, [facility]    CB/MEDQ  D:  08/15/2006  T:  08/16/2006  Job:  [id number]

## 2011-12-08 DIAGNOSIS — L821 Other seborrheic keratosis: Secondary | ICD-10-CM | POA: Diagnosis not present

## 2011-12-08 DIAGNOSIS — C4441 Basal cell carcinoma of skin of scalp and neck: Secondary | ICD-10-CM | POA: Diagnosis not present

## 2011-12-08 DIAGNOSIS — Z85820 Personal history of malignant melanoma of skin: Secondary | ICD-10-CM | POA: Diagnosis not present

## 2011-12-08 DIAGNOSIS — D239 Other benign neoplasm of skin, unspecified: Secondary | ICD-10-CM | POA: Diagnosis not present

## 2011-12-08 DIAGNOSIS — L851 Acquired keratosis [keratoderma] palmaris et plantaris: Secondary | ICD-10-CM | POA: Diagnosis not present

## 2011-12-08 DIAGNOSIS — L570 Actinic keratosis: Secondary | ICD-10-CM | POA: Diagnosis not present

## 2011-12-08 DIAGNOSIS — D485 Neoplasm of uncertain behavior of skin: Secondary | ICD-10-CM | POA: Diagnosis not present

## 2011-12-08 DIAGNOSIS — L820 Inflamed seborrheic keratosis: Secondary | ICD-10-CM | POA: Diagnosis not present

## 2012-01-05 DIAGNOSIS — L253 Unspecified contact dermatitis due to other chemical products: Secondary | ICD-10-CM | POA: Diagnosis not present

## 2012-01-12 DIAGNOSIS — R079 Chest pain, unspecified: Secondary | ICD-10-CM | POA: Diagnosis not present

## 2012-01-12 DIAGNOSIS — R030 Elevated blood-pressure reading, without diagnosis of hypertension: Secondary | ICD-10-CM | POA: Diagnosis not present

## 2012-01-12 DIAGNOSIS — K449 Diaphragmatic hernia without obstruction or gangrene: Secondary | ICD-10-CM | POA: Diagnosis not present

## 2012-01-12 DIAGNOSIS — I447 Left bundle-branch block, unspecified: Secondary | ICD-10-CM | POA: Diagnosis not present

## 2012-02-28 ENCOUNTER — Ambulatory Visit (INDEPENDENT_AMBULATORY_CARE_PROVIDER_SITE_OTHER): Payer: Medicare Other | Admitting: Cardiology

## 2012-02-28 ENCOUNTER — Telehealth: Payer: Self-pay | Admitting: Cardiology

## 2012-02-28 ENCOUNTER — Encounter: Payer: Self-pay | Admitting: Cardiology

## 2012-02-28 DIAGNOSIS — I3139 Other pericardial effusion (noninflammatory): Secondary | ICD-10-CM

## 2012-02-28 DIAGNOSIS — I359 Nonrheumatic aortic valve disorder, unspecified: Secondary | ICD-10-CM | POA: Diagnosis not present

## 2012-02-28 DIAGNOSIS — I319 Disease of pericardium, unspecified: Secondary | ICD-10-CM

## 2012-02-28 DIAGNOSIS — R6889 Other general symptoms and signs: Secondary | ICD-10-CM

## 2012-02-28 DIAGNOSIS — K219 Gastro-esophageal reflux disease without esophagitis: Secondary | ICD-10-CM

## 2012-02-28 DIAGNOSIS — I351 Nonrheumatic aortic (valve) insufficiency: Secondary | ICD-10-CM

## 2012-02-28 DIAGNOSIS — R079 Chest pain, unspecified: Secondary | ICD-10-CM | POA: Diagnosis not present

## 2012-02-28 DIAGNOSIS — I447 Left bundle-branch block, unspecified: Secondary | ICD-10-CM

## 2012-02-28 DIAGNOSIS — I313 Pericardial effusion (noninflammatory): Secondary | ICD-10-CM

## 2012-02-28 NOTE — Assessment & Plan Note (Signed)
The patient has had mild aortic insufficiency in the past. Her last echo was December, 2009. She does not need a followup echo at this time.

## 2012-02-28 NOTE — Assessment & Plan Note (Signed)
Patient had some vague chest pain. There was some discomfort in her jaw. Nexium was started and this has resolved. She has had these type of symptoms before. A nuclear scan in 2011 showed no ischemia. No further workup at this time.

## 2012-02-28 NOTE — Telephone Encounter (Signed)
New Problem:     Patient called in having a few questions about the paperwork she received from her visit today.  Please call back.

## 2012-02-28 NOTE — Assessment & Plan Note (Signed)
It appears that she has some chest discomfort related to her GERD. She is being treated for this.

## 2012-02-28 NOTE — Assessment & Plan Note (Signed)
The patient has an interventricular conduction delay there is nonspecific. This is old.

## 2012-02-28 NOTE — Patient Instructions (Signed)
Your physician wants you to follow-up in: 1 year. You will receive a reminder letter in the mail two months in advance. If you don't receive a letter, please call our office to schedule the follow-up appointment.  

## 2012-02-28 NOTE — Assessment & Plan Note (Signed)
We had a long discussion about her blood pressure. There is no evidence that she has significant hypertension. I reassured her. No further workup.  We'll see her back in one year.

## 2012-02-28 NOTE — Progress Notes (Signed)
HPI Patient is seen today for the evaluation of chest discomfort and a one-year followup visit in hypertension and interventricular conduction delay. I saw her last April, 2012. She follows carefully with her primary physician. Historically she's had variation in her blood pressure. It is minor staining that she actually worn ambulatory monitor and that it showed no major problems. She mentioned multiple blood pressures to me today. I feel that these are all safely within the range that require no treatment. She has not had syncope or presyncope.  She's had some vague chest discomfort. This is random. Also at times she has a discomfort in her right lower jaw. It is documented that she has had this type of symptom before from reflux. She was placed on Nexium recently and after starting this she's had no recurring symptoms. It certainly seems that her symptoms are related to her reflux.     No Known Allergies  Current Outpatient Prescriptions  Medication Sig Dispense Refill  . ALPRAZolam (XANAX) 0.5 MG tablet Take 0.5 mg by mouth as needed.        Marland Kitchen aspirin 81 MG EC tablet Take 81 mg by mouth daily.        Marland Kitchen esomeprazole (NEXIUM) 40 MG capsule Take 40 mg by mouth daily before breakfast.      . magnesium oxide (MAG-OX) 400 MG tablet Take 400 mg by mouth daily.      . Multiple Vitamin (MULTIVITAMIN) tablet Take 1 tablet by mouth daily.        . NON FORMULARY Sea Cucumber Vit 500mg  - 1 tablet daily       . NON FORMULARY Probiotic 1 per day       . OLIVE LEAF EXTRACT PO Take 1 tablet by mouth 2 (two) times daily. 900mg  each      . Omega-3 Fatty Acids (FISH OIL PO) Take 1 tablet by mouth daily.        . Calcium Carbonate-Vit D-Min (CALTRATE 600+D PLUS) 600-400 MG-UNIT per tablet Take 1 tablet by mouth daily.        Marland Kitchen CALCIUM-VITAMIN D PO 770mg  w/ 1000 vit D3 -- three times a day       . omeprazole (PRILOSEC) 20 MG capsule Take 20 mg by mouth every other day.        . Vitamin D, Ergocalciferol,  (DRISDOL) 50000 UNITS CAPS 1 EVERY MONTH         History   Social History  . Marital Status: Unknown    Spouse Name: N/A    Number of Children: N/A  . Years of Education: N/A   Occupational History  . Not on file.   Social History Main Topics  . Smoking status: Never Smoker   . Smokeless tobacco: Not on file  . Alcohol Use: Not on file  . Drug Use: Not on file  . Sexually Active: Not on file   Other Topics Concern  . Not on file   Social History Narrative  . No narrative on file    No family history on file.  Past Medical History  Diagnosis Date  . GERD (gastroesophageal reflux disease)     Patient sometimes has slight right lower jaw discomfort at the time of her reflux  . LBBB (left bundle branch block)   . Ejection fraction     55-60%, echo, and December, 2009, septal dyssynergy  . Aortic insufficiency     Mild, echo, November, 2007 /mild, echo, December, 2009  . Shoulder  pain     Left scapular pain, appeared to be radicular  . Blood pressure alteration     Historically pressure normal, April I. normal blood pressure in the office / recorded blood pressures from home normal  . Chest pain     Nuclear March, 2011, normal, ejection fraction 64%  . Pericardial effusion     Echo December, 2009, small, incidental    Past Surgical History  Procedure Date  . Cholecystectomy     ROS    Patient denies fever, chills, headache, sweats, rash, change in vision, change in hearing, , cough, nausea vomiting, urinary symptoms.All other systems are reviewed and are negative.  PHYSICAL EXAM  Patient is oriented to person time and place. Affect is normal. There is no jugulovenous distention. There no carotid bruits. Lungs are clear. Respiratory effort is nonlabored. Cardiac exam reveals S1 and S2. There are no clicks. I do not hear any significant murmurs. Her abdomen is soft. There is no peripheral edema. There are no musculoskeletal deformities. There are no skin  rashes.  Filed Vitals:   02/28/12 0858 02/28/12 0902  BP:  138/82  Pulse:  78  Height: 5\' 6"  (1.676 m) 5\' 6"  (1.676 m)  Weight: 139 lb 12.8 oz (63.413 kg) 139 lb (63.05 kg)   EKG is done today and reviewed by me. She has an interventricular conduction delay that is nonspecific. I compared this to prior tracing. There is no significant change.  ASSESSMENT & PLAN

## 2012-03-05 NOTE — Telephone Encounter (Signed)
Pt called, concerned about her AVS stating that her diagnosis from recent office visit stated chest pain but she wasn't currently having any at that time.  I told her I thought it printed out past diagnosis's also but would check with Dr Myrtis Ser.

## 2012-03-06 ENCOUNTER — Other Ambulatory Visit: Payer: Self-pay | Admitting: Obstetrics and Gynecology

## 2012-03-06 DIAGNOSIS — Z1231 Encounter for screening mammogram for malignant neoplasm of breast: Secondary | ICD-10-CM

## 2012-03-08 DIAGNOSIS — D235 Other benign neoplasm of skin of trunk: Secondary | ICD-10-CM | POA: Diagnosis not present

## 2012-03-08 DIAGNOSIS — C4370 Malignant melanoma of unspecified lower limb, including hip: Secondary | ICD-10-CM | POA: Diagnosis not present

## 2012-03-08 DIAGNOSIS — L851 Acquired keratosis [keratoderma] palmaris et plantaris: Secondary | ICD-10-CM | POA: Diagnosis not present

## 2012-03-08 DIAGNOSIS — D485 Neoplasm of uncertain behavior of skin: Secondary | ICD-10-CM | POA: Diagnosis not present

## 2012-03-08 DIAGNOSIS — Z85820 Personal history of malignant melanoma of skin: Secondary | ICD-10-CM | POA: Diagnosis not present

## 2012-03-08 DIAGNOSIS — L821 Other seborrheic keratosis: Secondary | ICD-10-CM | POA: Diagnosis not present

## 2012-03-08 DIAGNOSIS — L819 Disorder of pigmentation, unspecified: Secondary | ICD-10-CM | POA: Diagnosis not present

## 2012-03-08 DIAGNOSIS — D239 Other benign neoplasm of skin, unspecified: Secondary | ICD-10-CM | POA: Diagnosis not present

## 2012-03-27 DIAGNOSIS — C4370 Malignant melanoma of unspecified lower limb, including hip: Secondary | ICD-10-CM | POA: Diagnosis not present

## 2012-04-02 DIAGNOSIS — D485 Neoplasm of uncertain behavior of skin: Secondary | ICD-10-CM | POA: Diagnosis not present

## 2012-04-02 DIAGNOSIS — L819 Disorder of pigmentation, unspecified: Secondary | ICD-10-CM | POA: Diagnosis not present

## 2012-04-02 DIAGNOSIS — D239 Other benign neoplasm of skin, unspecified: Secondary | ICD-10-CM | POA: Diagnosis not present

## 2012-04-02 DIAGNOSIS — L88 Pyoderma gangrenosum: Secondary | ICD-10-CM | POA: Diagnosis not present

## 2012-04-02 DIAGNOSIS — C4370 Malignant melanoma of unspecified lower limb, including hip: Secondary | ICD-10-CM | POA: Diagnosis not present

## 2012-04-16 DIAGNOSIS — Z961 Presence of intraocular lens: Secondary | ICD-10-CM | POA: Diagnosis not present

## 2012-04-16 DIAGNOSIS — H0019 Chalazion unspecified eye, unspecified eyelid: Secondary | ICD-10-CM | POA: Diagnosis not present

## 2012-04-16 DIAGNOSIS — H5231 Anisometropia: Secondary | ICD-10-CM | POA: Diagnosis not present

## 2012-04-16 DIAGNOSIS — H52229 Regular astigmatism, unspecified eye: Secondary | ICD-10-CM | POA: Diagnosis not present

## 2012-05-21 DIAGNOSIS — H33009 Unspecified retinal detachment with retinal break, unspecified eye: Secondary | ICD-10-CM | POA: Diagnosis not present

## 2012-05-21 DIAGNOSIS — H35369 Drusen (degenerative) of macula, unspecified eye: Secondary | ICD-10-CM | POA: Diagnosis not present

## 2012-05-21 DIAGNOSIS — H35349 Macular cyst, hole, or pseudohole, unspecified eye: Secondary | ICD-10-CM | POA: Diagnosis not present

## 2012-06-07 DIAGNOSIS — R82998 Other abnormal findings in urine: Secondary | ICD-10-CM | POA: Diagnosis not present

## 2012-06-07 DIAGNOSIS — M899 Disorder of bone, unspecified: Secondary | ICD-10-CM | POA: Diagnosis not present

## 2012-06-07 DIAGNOSIS — E785 Hyperlipidemia, unspecified: Secondary | ICD-10-CM | POA: Diagnosis not present

## 2012-06-10 ENCOUNTER — Ambulatory Visit: Payer: Medicare Other

## 2012-06-14 DIAGNOSIS — Z0000 Encounter for general adult medical examination without abnormal findings: Secondary | ICD-10-CM | POA: Diagnosis not present

## 2012-06-14 DIAGNOSIS — H6120 Impacted cerumen, unspecified ear: Secondary | ICD-10-CM | POA: Diagnosis not present

## 2012-06-14 DIAGNOSIS — I359 Nonrheumatic aortic valve disorder, unspecified: Secondary | ICD-10-CM | POA: Diagnosis not present

## 2012-06-14 DIAGNOSIS — E785 Hyperlipidemia, unspecified: Secondary | ICD-10-CM | POA: Diagnosis not present

## 2012-06-17 DIAGNOSIS — D239 Other benign neoplasm of skin, unspecified: Secondary | ICD-10-CM | POA: Diagnosis not present

## 2012-06-17 DIAGNOSIS — Z85820 Personal history of malignant melanoma of skin: Secondary | ICD-10-CM | POA: Diagnosis not present

## 2012-06-17 DIAGNOSIS — L819 Disorder of pigmentation, unspecified: Secondary | ICD-10-CM | POA: Diagnosis not present

## 2012-06-17 DIAGNOSIS — L821 Other seborrheic keratosis: Secondary | ICD-10-CM | POA: Diagnosis not present

## 2012-06-17 DIAGNOSIS — D485 Neoplasm of uncertain behavior of skin: Secondary | ICD-10-CM | POA: Diagnosis not present

## 2012-06-17 DIAGNOSIS — L851 Acquired keratosis [keratoderma] palmaris et plantaris: Secondary | ICD-10-CM | POA: Diagnosis not present

## 2012-06-17 DIAGNOSIS — L253 Unspecified contact dermatitis due to other chemical products: Secondary | ICD-10-CM | POA: Diagnosis not present

## 2012-06-18 ENCOUNTER — Ambulatory Visit: Payer: Medicare Other

## 2012-06-18 DIAGNOSIS — Z1212 Encounter for screening for malignant neoplasm of rectum: Secondary | ICD-10-CM | POA: Diagnosis not present

## 2012-07-02 ENCOUNTER — Ambulatory Visit: Payer: Medicare Other

## 2012-07-11 ENCOUNTER — Ambulatory Visit
Admission: RE | Admit: 2012-07-11 | Discharge: 2012-07-11 | Disposition: A | Discharge status: Home / Self Care | Payer: Medicare Other | Source: Ambulatory Visit | Attending: Obstetrics and Gynecology | Admitting: Obstetrics and Gynecology

## 2012-07-11 DIAGNOSIS — Z1231 Encounter for screening mammogram for malignant neoplasm of breast: Secondary | ICD-10-CM

## 2012-07-25 DIAGNOSIS — Z01419 Encounter for gynecological examination (general) (routine) without abnormal findings: Secondary | ICD-10-CM | POA: Diagnosis not present

## 2012-07-25 DIAGNOSIS — Z124 Encounter for screening for malignant neoplasm of cervix: Secondary | ICD-10-CM | POA: Diagnosis not present

## 2012-07-25 DIAGNOSIS — Z0000 Encounter for general adult medical examination without abnormal findings: Secondary | ICD-10-CM | POA: Diagnosis not present

## 2012-09-12 DIAGNOSIS — D2360 Other benign neoplasm of skin of unspecified upper limb, including shoulder: Secondary | ICD-10-CM | POA: Diagnosis not present

## 2012-09-12 DIAGNOSIS — Z85820 Personal history of malignant melanoma of skin: Secondary | ICD-10-CM | POA: Diagnosis not present

## 2012-09-12 DIAGNOSIS — Z85828 Personal history of other malignant neoplasm of skin: Secondary | ICD-10-CM | POA: Diagnosis not present

## 2012-09-12 DIAGNOSIS — D485 Neoplasm of uncertain behavior of skin: Secondary | ICD-10-CM | POA: Diagnosis not present

## 2012-09-12 DIAGNOSIS — D1801 Hemangioma of skin and subcutaneous tissue: Secondary | ICD-10-CM | POA: Diagnosis not present

## 2012-09-12 DIAGNOSIS — C44319 Basal cell carcinoma of skin of other parts of face: Secondary | ICD-10-CM | POA: Diagnosis not present

## 2012-09-12 DIAGNOSIS — L570 Actinic keratosis: Secondary | ICD-10-CM | POA: Diagnosis not present

## 2012-09-12 DIAGNOSIS — C4401 Basal cell carcinoma of skin of lip: Secondary | ICD-10-CM | POA: Diagnosis not present

## 2012-09-12 DIAGNOSIS — L821 Other seborrheic keratosis: Secondary | ICD-10-CM | POA: Diagnosis not present

## 2012-09-24 DIAGNOSIS — C4401 Basal cell carcinoma of skin of lip: Secondary | ICD-10-CM | POA: Diagnosis not present

## 2012-11-05 DIAGNOSIS — Z23 Encounter for immunization: Secondary | ICD-10-CM | POA: Diagnosis not present

## 2012-12-11 DIAGNOSIS — L851 Acquired keratosis [keratoderma] palmaris et plantaris: Secondary | ICD-10-CM | POA: Diagnosis not present

## 2012-12-11 DIAGNOSIS — L570 Actinic keratosis: Secondary | ICD-10-CM | POA: Diagnosis not present

## 2012-12-11 DIAGNOSIS — D1801 Hemangioma of skin and subcutaneous tissue: Secondary | ICD-10-CM | POA: Diagnosis not present

## 2012-12-11 DIAGNOSIS — D239 Other benign neoplasm of skin, unspecified: Secondary | ICD-10-CM | POA: Diagnosis not present

## 2012-12-11 DIAGNOSIS — D235 Other benign neoplasm of skin of trunk: Secondary | ICD-10-CM | POA: Diagnosis not present

## 2012-12-11 DIAGNOSIS — Z85820 Personal history of malignant melanoma of skin: Secondary | ICD-10-CM | POA: Diagnosis not present

## 2012-12-11 DIAGNOSIS — D2360 Other benign neoplasm of skin of unspecified upper limb, including shoulder: Secondary | ICD-10-CM | POA: Diagnosis not present

## 2012-12-11 DIAGNOSIS — Z85828 Personal history of other malignant neoplasm of skin: Secondary | ICD-10-CM | POA: Diagnosis not present

## 2012-12-11 DIAGNOSIS — L821 Other seborrheic keratosis: Secondary | ICD-10-CM | POA: Diagnosis not present

## 2013-01-22 DIAGNOSIS — H6120 Impacted cerumen, unspecified ear: Secondary | ICD-10-CM | POA: Diagnosis not present

## 2013-02-21 DIAGNOSIS — H33309 Unspecified retinal break, unspecified eye: Secondary | ICD-10-CM | POA: Diagnosis not present

## 2013-03-04 ENCOUNTER — Encounter: Payer: Self-pay | Admitting: Cardiology

## 2013-03-05 ENCOUNTER — Ambulatory Visit (INDEPENDENT_AMBULATORY_CARE_PROVIDER_SITE_OTHER): Payer: Medicare Other | Admitting: Cardiology

## 2013-03-05 ENCOUNTER — Encounter: Payer: Self-pay | Admitting: Cardiology

## 2013-03-05 VITALS — BP 139/86 | HR 64 | Ht 66.0 in | Wt 137.6 lb

## 2013-03-05 DIAGNOSIS — R6889 Other general symptoms and signs: Secondary | ICD-10-CM

## 2013-03-05 DIAGNOSIS — R079 Chest pain, unspecified: Secondary | ICD-10-CM

## 2013-03-05 DIAGNOSIS — R9430 Abnormal result of cardiovascular function study, unspecified: Secondary | ICD-10-CM

## 2013-03-05 DIAGNOSIS — I351 Nonrheumatic aortic (valve) insufficiency: Secondary | ICD-10-CM

## 2013-03-05 DIAGNOSIS — E785 Hyperlipidemia, unspecified: Secondary | ICD-10-CM | POA: Insufficient documentation

## 2013-03-05 DIAGNOSIS — I359 Nonrheumatic aortic valve disorder, unspecified: Secondary | ICD-10-CM | POA: Diagnosis not present

## 2013-03-05 DIAGNOSIS — I447 Left bundle-branch block, unspecified: Secondary | ICD-10-CM

## 2013-03-05 DIAGNOSIS — R0989 Other specified symptoms and signs involving the circulatory and respiratory systems: Secondary | ICD-10-CM

## 2013-03-05 NOTE — Assessment & Plan Note (Signed)
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

## 2013-03-05 NOTE — Patient Instructions (Addendum)
Your physician recommends that you continue on your current medications as directed. Please refer to the Current Medication list given to you today.  Your physician wants you to follow-up in: 1 year. You will receive a reminder letter in the mail two months in advance. If you don't receive a letter, please call our office to schedule the follow-up appointment.  

## 2013-03-05 NOTE — Progress Notes (Signed)
HPI  Patient is seen today to followup a prior history of some chest discomfort. She's not having any chest pain now. I saw her last May, 2013. She does have an interventricular conduction delay of a left bundle branch block type. She has had a nuclear exercise test in the past showing no significant abnormalities. She does have some GI reflux.  She tells me that her primary physician tried Pravachol. She thought this affected her mental status and she stopped it. She asked me for further recommendations.  No Known Allergies  Current Outpatient Prescriptions  Medication Sig Dispense Refill  . ALPRAZolam (XANAX) 0.5 MG tablet Take 0.5 mg by mouth as needed.        Marland Kitchen aspirin 81 MG EC tablet Take 81 mg by mouth daily.        . Multiple Vitamin (MULTIVITAMIN) tablet Take 1 tablet by mouth daily.        . NON FORMULARY Sea Cucumber Vit 500mg  - 1 tablet daily       . OLIVE LEAF EXTRACT PO Take 1 tablet by mouth 2 (two) times daily. 900mg  each      . Omega-3 Fatty Acids (FISH OIL PO) Take 1 tablet by mouth daily.        . ranitidine (ZANTAC) 150 MG capsule Take 150 mg by mouth as needed for heartburn.       No current facility-administered medications for this visit.    History   Social History  . Marital Status: Married    Spouse Name: N/A    Number of Children: N/A  . Years of Education: N/A   Occupational History  . Not on file.   Social History Main Topics  . Smoking status: Never Smoker   . Smokeless tobacco: Not on file  . Alcohol Use: Not on file  . Drug Use: Not on file  . Sexually Active: Not on file   Other Topics Concern  . Not on file   Social History Narrative  . No narrative on file    No family history on file.  Past Medical History  Diagnosis Date  . GERD (gastroesophageal reflux disease)     Patient sometimes has slight right lower jaw discomfort at the time of her reflux  . LBBB (left bundle branch block)   . Ejection fraction     55-60%, echo, and  December, 2009, septal dyssynergy  . Aortic insufficiency     Mild, echo, November, 2007 /mild, echo, December, 2009  . Shoulder pain     Left scapular pain, appeared to be radicular  . Blood pressure alteration     Historically pressure normal, April I. normal blood pressure in the office / recorded blood pressures from home normal  . Chest pain     Nuclear March, 2011, normal, ejection fraction 64%  . Pericardial effusion     Echo December, 2009, small, incidental    Past Surgical History  Procedure Laterality Date  . Cholecystectomy      Patient Active Problem List   Diagnosis Date Noted  . Blood pressure alteration   . GERD (gastroesophageal reflux disease)   . Ejection fraction   . Aortic insufficiency   . Shoulder pain   . Chest pain   . Pericardial effusion   . LBBB 01/03/2010    ROS   Patient denies fever, chills, headache, sweats, rash, change in vision, change in hearing, chest pain, cough, nausea vomiting, urinary symptoms. All other systems are reviewed and  are negative.  PHYSICAL EXAM  Patient is oriented to person time and place. Affect is normal. There is no jugulovenous distention. Lungs are clear. Respiratory effort is nonlabored. Cardiac exam reveals S1 and S2. There no clicks or significant murmurs. The abdomen is soft. There is no peripheral edema.  Filed Vitals:   03/05/13 0959  BP: 139/86  Pulse: 64  Height: 5\' 6"  (1.676 m)  Weight: 137 lb 9.6 oz (62.415 kg)   EKG is done today and reviewed by me. There is sinus rhythm. There is left bundle branch block. There is no significant change. ASSESSMENT & PLAN

## 2013-03-05 NOTE — Assessment & Plan Note (Signed)
Blood pressure stable. We had recorded some blood pressures at home in the past that were normal. No change in therapy.

## 2013-03-05 NOTE — Assessment & Plan Note (Signed)
Patient tells me that she was given some Pravachol and felt poorly and stopped it. I do not have her labs. I've asked her to followup with her primary physician concerning this issue.

## 2013-03-05 NOTE — Assessment & Plan Note (Signed)
She has not had any significant chest pain. 

## 2013-03-05 NOTE — Assessment & Plan Note (Signed)
I do not hear her murmur of aortic insufficiency. She does not need a followup echo at this time.

## 2013-03-06 ENCOUNTER — Ambulatory Visit: Payer: Medicare Other | Admitting: Cardiology

## 2013-03-12 DIAGNOSIS — L821 Other seborrheic keratosis: Secondary | ICD-10-CM | POA: Diagnosis not present

## 2013-03-12 DIAGNOSIS — Z85820 Personal history of malignant melanoma of skin: Secondary | ICD-10-CM | POA: Diagnosis not present

## 2013-03-12 DIAGNOSIS — D239 Other benign neoplasm of skin, unspecified: Secondary | ICD-10-CM | POA: Diagnosis not present

## 2013-03-12 DIAGNOSIS — L819 Disorder of pigmentation, unspecified: Secondary | ICD-10-CM | POA: Diagnosis not present

## 2013-03-12 DIAGNOSIS — Z85828 Personal history of other malignant neoplasm of skin: Secondary | ICD-10-CM | POA: Diagnosis not present

## 2013-03-12 DIAGNOSIS — D1801 Hemangioma of skin and subcutaneous tissue: Secondary | ICD-10-CM | POA: Diagnosis not present

## 2013-03-12 DIAGNOSIS — L570 Actinic keratosis: Secondary | ICD-10-CM | POA: Diagnosis not present

## 2013-05-06 ENCOUNTER — Other Ambulatory Visit: Payer: Self-pay

## 2013-05-06 DIAGNOSIS — Z1231 Encounter for screening mammogram for malignant neoplasm of breast: Secondary | ICD-10-CM

## 2013-05-27 DIAGNOSIS — H33009 Unspecified retinal detachment with retinal break, unspecified eye: Secondary | ICD-10-CM | POA: Diagnosis not present

## 2013-05-27 DIAGNOSIS — H35349 Macular cyst, hole, or pseudohole, unspecified eye: Secondary | ICD-10-CM | POA: Diagnosis not present

## 2013-05-27 DIAGNOSIS — H35369 Drusen (degenerative) of macula, unspecified eye: Secondary | ICD-10-CM | POA: Diagnosis not present

## 2013-06-18 DIAGNOSIS — M899 Disorder of bone, unspecified: Secondary | ICD-10-CM | POA: Diagnosis not present

## 2013-06-18 DIAGNOSIS — H2510 Age-related nuclear cataract, unspecified eye: Secondary | ICD-10-CM | POA: Diagnosis not present

## 2013-06-18 DIAGNOSIS — H35369 Drusen (degenerative) of macula, unspecified eye: Secondary | ICD-10-CM | POA: Diagnosis not present

## 2013-06-18 DIAGNOSIS — E785 Hyperlipidemia, unspecified: Secondary | ICD-10-CM | POA: Diagnosis not present

## 2013-06-18 DIAGNOSIS — R030 Elevated blood-pressure reading, without diagnosis of hypertension: Secondary | ICD-10-CM | POA: Diagnosis not present

## 2013-06-25 ENCOUNTER — Encounter: Payer: Self-pay | Admitting: Gastroenterology

## 2013-06-25 DIAGNOSIS — K449 Diaphragmatic hernia without obstruction or gangrene: Secondary | ICD-10-CM | POA: Diagnosis not present

## 2013-06-25 DIAGNOSIS — Z0000 Encounter for general adult medical examination without abnormal findings: Secondary | ICD-10-CM | POA: Diagnosis not present

## 2013-06-25 DIAGNOSIS — E785 Hyperlipidemia, unspecified: Secondary | ICD-10-CM | POA: Diagnosis not present

## 2013-06-25 DIAGNOSIS — Z1331 Encounter for screening for depression: Secondary | ICD-10-CM | POA: Diagnosis not present

## 2013-06-25 DIAGNOSIS — R51 Headache: Secondary | ICD-10-CM | POA: Diagnosis not present

## 2013-06-25 DIAGNOSIS — M899 Disorder of bone, unspecified: Secondary | ICD-10-CM | POA: Diagnosis not present

## 2013-06-25 DIAGNOSIS — I359 Nonrheumatic aortic valve disorder, unspecified: Secondary | ICD-10-CM | POA: Diagnosis not present

## 2013-06-25 DIAGNOSIS — R030 Elevated blood-pressure reading, without diagnosis of hypertension: Secondary | ICD-10-CM | POA: Diagnosis not present

## 2013-06-25 DIAGNOSIS — H6120 Impacted cerumen, unspecified ear: Secondary | ICD-10-CM | POA: Diagnosis not present

## 2013-07-01 DIAGNOSIS — Z1212 Encounter for screening for malignant neoplasm of rectum: Secondary | ICD-10-CM | POA: Diagnosis not present

## 2013-07-21 ENCOUNTER — Ambulatory Visit
Admission: RE | Admit: 2013-07-21 | Discharge: 2013-07-21 | Disposition: A | Discharge status: Home / Self Care | Payer: Medicare Other | Source: Ambulatory Visit

## 2013-07-21 DIAGNOSIS — Z1231 Encounter for screening mammogram for malignant neoplasm of breast: Secondary | ICD-10-CM

## 2013-07-23 DIAGNOSIS — H6120 Impacted cerumen, unspecified ear: Secondary | ICD-10-CM | POA: Diagnosis not present

## 2013-07-28 ENCOUNTER — Encounter: Payer: Self-pay | Admitting: Gastroenterology

## 2013-07-28 ENCOUNTER — Ambulatory Visit (INDEPENDENT_AMBULATORY_CARE_PROVIDER_SITE_OTHER): Payer: Medicare Other | Admitting: Gastroenterology

## 2013-07-28 VITALS — BP 116/74 | HR 80 | Ht 66.0 in | Wt 135.6 lb

## 2013-07-28 DIAGNOSIS — K219 Gastro-esophageal reflux disease without esophagitis: Secondary | ICD-10-CM | POA: Diagnosis not present

## 2013-07-28 DIAGNOSIS — Z1211 Encounter for screening for malignant neoplasm of colon: Secondary | ICD-10-CM | POA: Diagnosis not present

## 2013-07-28 MED ORDER — RANITIDINE HCL 150 MG PO CAPS: 150.0000 mg | ORAL_CAPSULE | Freq: Two times a day (BID) | ORAL | Status: DC

## 2013-07-28 MED ORDER — SOD PICOSULFATE-MAG OX-CIT ACD 10-3.5-12 MG-GM-GM PO PACK: 1.0000 | PACK | ORAL | Status: DC

## 2013-07-28 NOTE — Progress Notes (Addendum)
History of Present Illness: This is a 77 year old female who has had intermittent reflux symptoms over the years. She underwent upper GI series in 2009 that showed a small hiatal hernia and moderate reflux. Recently she's had a return of heartburn, regurgitation and belching. She was prescribed Nexium which led to abdominal pain since she discontinued it. She's been taking ranitidine 150 mg when necessary with fairly good relief of her symptoms. She states she underwent colonoscopy in [place] in 2004 and she was recently contacted by a gastroenterologist in [place] for screening colonoscopy. Denies weight loss, abdominal pain, constipation, diarrhea, change in stool caliber, melena, hematochezia, nausea, vomiting, dysphagia, chest pain.  Review of Systems: Pertinent positive and negative review of systems were noted in the above HPI section. All other review of systems were otherwise negative.  Current Medications, Allergies, Past Medical History, Past Surgical History, Family History and Social History were reviewed in [employer] record.  Physical Exam: General: Well developed , well nourished, no acute distress Head: Normocephalic and atraumatic Eyes:  sclerae anicteric, EOMI Ears: Normal auditory acuity Mouth: No deformity or lesions Neck: Supple, no masses or thyromegaly Lungs: Clear throughout to auscultation Heart: Regular rate and rhythm; no murmurs, rubs or bruits Abdomen: Soft, non tender and non distended. No masses, hepatosplenomegaly or hernias noted. Normal Bowel sounds Rectal: Deferred to colonoscopy Musculoskeletal: Symmetrical with no gross deformities  Skin: No lesions on visible extremities Pulses:  Normal pulses noted Extremities: No clubbing, cyanosis, edema or deformities noted Neurological: Alert oriented x 4, grossly nonfocal Cervical Nodes:  No significant cervical adenopathy Inguinal Nodes: No significant inguinal adenopathy Psychological:   Alert and cooperative. Normal mood and affect  Assessment and Recommendations:  1. GERD. Chronic intermittent symptoms. Standard antireflux measures. Ranitidine 150 mg twice a day for now. Schedule upper endoscopy to rule out esophagitis, Barrett's, ulcer, neoplasm. The risks, benefits, and alternatives to endoscopy with possible biopsy and possible dilation were discussed with the patient and they consent to proceed.   2. Colorectal cancer screening. She is due for 10 year screening colonoscopy. The risks, benefits, and alternatives to colonoscopy with possible biopsy and possible polypectomy were discussed with the patient and they consent to proceed.    09/12/2013 Colonoscopy report from [facility] in [place] received and reviewed. Colonoscopy on 05/07/2003 was normal. 10 year follow up was recommended.

## 2013-07-28 NOTE — Patient Instructions (Addendum)
Increase your Zantac to one tablet by mouth twice daily.   Patient advised to avoid spicy, acidic, citrus, chocolate, mints, fruit and fruit juices.  Limit the intake of caffeine, alcohol and Soda.  Don't exercise too soon after eating.  Don't lie down within 3-4 hours of eating.  Elevate the head of your bed.  You have been scheduled for an endoscopy and colonoscopy with propofol. Please follow the written instructions given to you at your visit today. Please pick up your prep at the pharmacy within the next 1-3 days. If you use inhalers (even only as needed), please bring them with you on the day of your procedure. Your physician has requested that you go to www.startemmi.com and enter the access code given to you at your visit today. This web site gives a general overview about your procedure. However, you should still follow specific instructions given to you by our office regarding your preparation for the procedure.  Thank you for choosing me and [facility].  Venita Lick. Pleas Koch., MD., [name]

## 2013-07-29 ENCOUNTER — Telehealth: Payer: Self-pay | Admitting: Gastroenterology

## 2013-07-29 NOTE — Telephone Encounter (Signed)
Put free prep kit of Prepopik up front for patient to pick and pt notified. Called [facility] and cancelled prescription.

## 2013-07-30 DIAGNOSIS — Z23 Encounter for immunization: Secondary | ICD-10-CM | POA: Diagnosis not present

## 2013-07-31 DIAGNOSIS — R030 Elevated blood-pressure reading, without diagnosis of hypertension: Secondary | ICD-10-CM | POA: Diagnosis not present

## 2013-07-31 DIAGNOSIS — H8110 Benign paroxysmal vertigo, unspecified ear: Secondary | ICD-10-CM | POA: Diagnosis not present

## 2013-07-31 DIAGNOSIS — J309 Allergic rhinitis, unspecified: Secondary | ICD-10-CM | POA: Diagnosis not present

## 2013-07-31 DIAGNOSIS — R51 Headache: Secondary | ICD-10-CM | POA: Diagnosis not present

## 2013-08-01 DIAGNOSIS — Z01419 Encounter for gynecological examination (general) (routine) without abnormal findings: Secondary | ICD-10-CM | POA: Diagnosis not present

## 2013-08-27 ENCOUNTER — Encounter: Payer: Self-pay | Admitting: Gastroenterology

## 2013-08-27 ENCOUNTER — Ambulatory Visit (AMBULATORY_SURGERY_CENTER): Payer: Medicare Other | Admitting: Gastroenterology

## 2013-08-27 VITALS — BP 121/66 | HR 64 | Temp 97.9°F | Resp 18 | Ht 66.0 in | Wt 135.0 lb

## 2013-08-27 DIAGNOSIS — K219 Gastro-esophageal reflux disease without esophagitis: Secondary | ICD-10-CM

## 2013-08-27 DIAGNOSIS — D126 Benign neoplasm of colon, unspecified: Secondary | ICD-10-CM | POA: Diagnosis not present

## 2013-08-27 DIAGNOSIS — I359 Nonrheumatic aortic valve disorder, unspecified: Secondary | ICD-10-CM | POA: Diagnosis not present

## 2013-08-27 DIAGNOSIS — Z1211 Encounter for screening for malignant neoplasm of colon: Secondary | ICD-10-CM | POA: Diagnosis not present

## 2013-08-27 MED ORDER — SODIUM CHLORIDE 0.9 % IV SOLN: 500.0000 mL | INTRAVENOUS | Status: DC

## 2013-08-27 NOTE — Progress Notes (Signed)
Procedure ends, to recovery, report given and VSS. 

## 2013-08-27 NOTE — Op Note (Addendum)
[facility] [address] [place], [address]   COLONOSCOPY PROCEDURE REPORT  PATIENT: Julia, [name]  MR#: [id number] BIRTHDATE: 10-23-1936 , 77  yrs. old GENDER: Female ENDOSCOPIST: Meryl Dare, MD, [facility] REFERRED WN:[id number] Julia [name], M.D. PROCEDURE DATE:  08/27/2013 PROCEDURE:   Colonoscopy with snare polypectomy First Screening Colonoscopy - Avg.  risk and is 50 yrs.  old or older - No.  Prior Negative Screening - Now for repeat screening. 10 or more years since last screening  History of Adenoma - Now for follow-up colonoscopy & has been > or = to 3 yrs.  N/A  Polyps Removed Today? Yes. ASA CLASS:   Class II INDICATIONS:average risk screening. MEDICATIONS: MAC sedation, administered by CRNA and propofol (Diprivan) 250mg  IV DESCRIPTION OF PROCEDURE:   After the risks benefits and alternatives of the procedure were thoroughly explained, informed consent was obtained.  A digital rectal exam revealed no abnormalities of the rectum.   The LB [id number]  endoscope was introduced through the anus and advanced to the cecum, which was identified by both the appendix and ileocecal valve. No adverse events experienced  with a tortuous colon.   The quality of the prep was Prepopik good  The instrument was then slowly withdrawn as the colon was fully examined.  COLON FINDINGS: Mild diverticulosis was noted in the sigmoid colon. A sessile polyp measuring 5 mm in size was found in the rectum.  A polypectomy was performed with a cold snare.  The resection was complete and the polyp tissue was completely retrieved.   The colon was otherwise normal.  There was no diverticulosis, inflammation, polyps or cancers unless previously stated.  Retroflexed views revealed small internal hemorrhoids. The time to cecum=4 minutes 11 seconds.  Withdrawal time=11 minutes 17 seconds.  The scope was withdrawn and the procedure completed. COMPLICATIONS: There were no  complications.  ENDOSCOPIC IMPRESSION: 1.   Mild diverticulosis in the sigmoid colon 2.   Sessile polyp measuring 5 mm in the rectum; polypectomy performed with a cold snare 3.   Small internal hemorrhoids  RECOMMENDATIONS: 1.  Await pathology results 2.  Repeat colonoscopy in 5 years if polyp adenomatous; otherwise no plans for future screening or surveillance colonoscopies. These type of exam usually stop aroung age 81. 3.  High fiber diet with liberal fluid intake.  eSigned:  Meryl Dare, MD, [facility] 08/27/2013 3:07 PM Revised: 08/27/2013 3:07 PM    PATIENT NAME:  Julia, [name] MR#: [id number]

## 2013-08-27 NOTE — Patient Instructions (Signed)

## 2013-08-27 NOTE — Progress Notes (Signed)
Called to room to assist during endoscopic procedure.  Patient ID and intended procedure confirmed with present staff. Received instructions for my participation in the procedure from the performing physician.  

## 2013-08-27 NOTE — Progress Notes (Signed)
Patient did not experience any of the following events: a burn prior to discharge; a fall within the facility; wrong site/side/patient/procedure/implant event; or a hospital transfer or hospital admission upon discharge from the facility. (G8907) Patient did not have preoperative order for IV antibiotic SSI prophylaxis. (G8918)  

## 2013-08-27 NOTE — Op Note (Signed)
[facility] [address] [place], [address]   ENDOSCOPY PROCEDURE REPORT  PATIENT: Julia [name], Julia [name]  MR#: [id number] BIRTHDATE: Nov 28, 1935 , 77  yrs. old GENDER: Female ENDOSCOPIST: Meryl Dare, MD, [facility] REFERRED BY:  Jarome Matin, M.D. PROCEDURE DATE:  08/27/2013 PROCEDURE:  EGD, diagnostic ASA CLASS:     Class II INDICATIONS:  History of esophageal reflux. MEDICATIONS: There was residual sedation effect present from prior procedure, MAC sedation, administered by CRNA, and propofol (Diprivan) 150mg  IV TOPICAL ANESTHETIC: none DESCRIPTION OF PROCEDURE: After the risks benefits and alternatives of the procedure were thoroughly explained, informed consent was obtained.  The LB [id number] endoscope was introduced through the mouth and advanced to the second portion of the duodenum  without limitations.  The instrument was slowly withdrawn as the mucosa was fully examined.  ESOPHAGUS: The mucosa of the esophagus appeared normal. STOMACH: The mucosa and folds of the stomach appeared normal. DUODENUM: The duodenal mucosa showed no abnormalities in the bulb and second portion of the duodenum.  Retroflexed views revealed no abnormalities.   The scope was then withdrawn from the patient and the procedure completed.  COMPLICATIONS: There were no complications.  ENDOSCOPIC IMPRESSION: 1.   EGD appeared normal  RECOMMENDATIONS: 1.  Anti-reflux regimen 2.  Continue H2RA   eSigned:  Meryl Dare, MD, [facility] 08/27/2013 3:06 PM

## 2013-08-28 ENCOUNTER — Telehealth: Payer: Self-pay | Admitting: *Deleted

## 2013-08-28 NOTE — Telephone Encounter (Signed)
  Follow up Call-  Call back number 08/27/2013  Post procedure Call Back phone  # [phone]  Permission to leave phone message Yes     Patient questions:  Do you have a fever, pain , or abdominal swelling? no Pain Score  0 *  Have you tolerated food without any problems? yes  Have you been able to return to your normal activities? yes  Do you have any questions about your discharge instructions: Diet   no Medications  no Follow up visit  no  Do you have questions or concerns about your Care? no  Actions: * If pain score is 4 or above: No action needed, pain <4.

## 2013-09-02 ENCOUNTER — Telehealth: Payer: Self-pay | Admitting: Gastroenterology

## 2013-09-02 NOTE — Telephone Encounter (Signed)
Rec'd from [facility] forward  5 pages to Dr.Stark

## 2013-09-03 ENCOUNTER — Encounter: Payer: Self-pay | Admitting: Gastroenterology

## 2013-09-12 ENCOUNTER — Other Ambulatory Visit: Payer: Self-pay | Admitting: Dermatology

## 2013-09-12 DIAGNOSIS — D2360 Other benign neoplasm of skin of unspecified upper limb, including shoulder: Secondary | ICD-10-CM | POA: Diagnosis not present

## 2013-09-12 DIAGNOSIS — D485 Neoplasm of uncertain behavior of skin: Secondary | ICD-10-CM | POA: Diagnosis not present

## 2013-09-12 DIAGNOSIS — L821 Other seborrheic keratosis: Secondary | ICD-10-CM | POA: Diagnosis not present

## 2013-09-12 DIAGNOSIS — L819 Disorder of pigmentation, unspecified: Secondary | ICD-10-CM | POA: Diagnosis not present

## 2013-09-12 DIAGNOSIS — Z85820 Personal history of malignant melanoma of skin: Secondary | ICD-10-CM | POA: Diagnosis not present

## 2013-09-12 DIAGNOSIS — L570 Actinic keratosis: Secondary | ICD-10-CM | POA: Diagnosis not present

## 2013-09-12 DIAGNOSIS — Z85828 Personal history of other malignant neoplasm of skin: Secondary | ICD-10-CM | POA: Diagnosis not present

## 2013-09-12 DIAGNOSIS — D239 Other benign neoplasm of skin, unspecified: Secondary | ICD-10-CM | POA: Diagnosis not present

## 2013-09-12 DIAGNOSIS — D235 Other benign neoplasm of skin of trunk: Secondary | ICD-10-CM | POA: Diagnosis not present

## 2013-09-29 DIAGNOSIS — H2510 Age-related nuclear cataract, unspecified eye: Secondary | ICD-10-CM | POA: Diagnosis not present

## 2013-10-09 DIAGNOSIS — Z888 Allergy status to other drugs, medicaments and biological substances status: Secondary | ICD-10-CM | POA: Diagnosis not present

## 2013-10-09 DIAGNOSIS — H2589 Other age-related cataract: Secondary | ICD-10-CM | POA: Diagnosis not present

## 2013-10-09 DIAGNOSIS — Z86010 Personal history of colonic polyps: Secondary | ICD-10-CM | POA: Diagnosis not present

## 2013-10-09 DIAGNOSIS — Z961 Presence of intraocular lens: Secondary | ICD-10-CM | POA: Diagnosis not present

## 2013-10-09 DIAGNOSIS — Z9889 Other specified postprocedural states: Secondary | ICD-10-CM | POA: Diagnosis not present

## 2013-10-09 DIAGNOSIS — K219 Gastro-esophageal reflux disease without esophagitis: Secondary | ICD-10-CM | POA: Diagnosis not present

## 2013-10-09 DIAGNOSIS — Z9849 Cataract extraction status, unspecified eye: Secondary | ICD-10-CM | POA: Diagnosis not present

## 2013-10-09 DIAGNOSIS — Z9089 Acquired absence of other organs: Secondary | ICD-10-CM | POA: Diagnosis not present

## 2013-10-09 DIAGNOSIS — Z85820 Personal history of malignant melanoma of skin: Secondary | ICD-10-CM | POA: Diagnosis not present

## 2013-10-15 DIAGNOSIS — L678 Other hair color and hair shaft abnormalities: Secondary | ICD-10-CM | POA: Diagnosis not present

## 2013-10-15 DIAGNOSIS — Z85820 Personal history of malignant melanoma of skin: Secondary | ICD-10-CM | POA: Diagnosis not present

## 2013-10-15 DIAGNOSIS — Z85828 Personal history of other malignant neoplasm of skin: Secondary | ICD-10-CM | POA: Diagnosis not present

## 2013-10-15 DIAGNOSIS — L738 Other specified follicular disorders: Secondary | ICD-10-CM | POA: Diagnosis not present

## 2013-10-31 DIAGNOSIS — Z85828 Personal history of other malignant neoplasm of skin: Secondary | ICD-10-CM | POA: Diagnosis not present

## 2013-10-31 DIAGNOSIS — L0292 Furuncle, unspecified: Secondary | ICD-10-CM | POA: Diagnosis not present

## 2013-10-31 DIAGNOSIS — Z85820 Personal history of malignant melanoma of skin: Secondary | ICD-10-CM | POA: Diagnosis not present

## 2013-10-31 DIAGNOSIS — L0293 Carbuncle, unspecified: Secondary | ICD-10-CM | POA: Diagnosis not present

## 2013-11-14 DIAGNOSIS — H35369 Drusen (degenerative) of macula, unspecified eye: Secondary | ICD-10-CM | POA: Diagnosis not present

## 2013-11-14 DIAGNOSIS — H2640 Unspecified secondary cataract: Secondary | ICD-10-CM | POA: Diagnosis not present

## 2013-11-14 DIAGNOSIS — H33009 Unspecified retinal detachment with retinal break, unspecified eye: Secondary | ICD-10-CM | POA: Diagnosis not present

## 2013-11-14 DIAGNOSIS — Z961 Presence of intraocular lens: Secondary | ICD-10-CM | POA: Diagnosis not present

## 2013-11-14 DIAGNOSIS — H35349 Macular cyst, hole, or pseudohole, unspecified eye: Secondary | ICD-10-CM | POA: Diagnosis not present

## 2013-11-14 DIAGNOSIS — H2510 Age-related nuclear cataract, unspecified eye: Secondary | ICD-10-CM | POA: Diagnosis not present

## 2013-12-17 ENCOUNTER — Other Ambulatory Visit: Payer: Self-pay | Admitting: Dermatology

## 2013-12-17 DIAGNOSIS — D1801 Hemangioma of skin and subcutaneous tissue: Secondary | ICD-10-CM | POA: Diagnosis not present

## 2013-12-17 DIAGNOSIS — Z85828 Personal history of other malignant neoplasm of skin: Secondary | ICD-10-CM | POA: Diagnosis not present

## 2013-12-17 DIAGNOSIS — D485 Neoplasm of uncertain behavior of skin: Secondary | ICD-10-CM | POA: Diagnosis not present

## 2013-12-17 DIAGNOSIS — D239 Other benign neoplasm of skin, unspecified: Secondary | ICD-10-CM | POA: Diagnosis not present

## 2013-12-17 DIAGNOSIS — L905 Scar conditions and fibrosis of skin: Secondary | ICD-10-CM | POA: Diagnosis not present

## 2013-12-17 DIAGNOSIS — L821 Other seborrheic keratosis: Secondary | ICD-10-CM | POA: Diagnosis not present

## 2013-12-17 DIAGNOSIS — Z85820 Personal history of malignant melanoma of skin: Secondary | ICD-10-CM | POA: Diagnosis not present

## 2013-12-17 DIAGNOSIS — L570 Actinic keratosis: Secondary | ICD-10-CM | POA: Diagnosis not present

## 2013-12-26 DIAGNOSIS — H2640 Unspecified secondary cataract: Secondary | ICD-10-CM | POA: Diagnosis not present

## 2014-02-11 DIAGNOSIS — M899 Disorder of bone, unspecified: Secondary | ICD-10-CM | POA: Diagnosis not present

## 2014-02-11 DIAGNOSIS — M949 Disorder of cartilage, unspecified: Secondary | ICD-10-CM | POA: Diagnosis not present

## 2014-03-06 ENCOUNTER — Ambulatory Visit (INDEPENDENT_AMBULATORY_CARE_PROVIDER_SITE_OTHER): Payer: Medicare Other | Admitting: Cardiology

## 2014-03-06 ENCOUNTER — Encounter: Payer: Self-pay | Admitting: Cardiology

## 2014-03-06 VITALS — BP 130/85 | HR 63 | Ht 66.0 in | Wt 135.0 lb

## 2014-03-06 DIAGNOSIS — I447 Left bundle-branch block, unspecified: Secondary | ICD-10-CM

## 2014-03-06 DIAGNOSIS — R6889 Other general symptoms and signs: Secondary | ICD-10-CM | POA: Diagnosis not present

## 2014-03-06 DIAGNOSIS — K219 Gastro-esophageal reflux disease without esophagitis: Secondary | ICD-10-CM

## 2014-03-06 DIAGNOSIS — I359 Nonrheumatic aortic valve disorder, unspecified: Secondary | ICD-10-CM | POA: Diagnosis not present

## 2014-03-06 DIAGNOSIS — E785 Hyperlipidemia, unspecified: Secondary | ICD-10-CM

## 2014-03-06 DIAGNOSIS — I351 Nonrheumatic aortic (valve) insufficiency: Secondary | ICD-10-CM

## 2014-03-06 NOTE — Progress Notes (Signed)
Patient ID: Julia [name], female   DOB: [birth year], 78 y.o.   MRN: [id number]    HPI  Patient is seen for cardiology followup. I saw her last May, 2014. She's had some chest pain in the past with a normal nuclear study. There was no significant abnormality. She does have an interventricular conduction delay of the left bundle branch type. Her GI reflux is under control. She felt very poorly with Pravachol and it was tried in the past. Overall she's doing very well. She also has a history of some mild aortic insufficiency.  No Known Allergies  Current Outpatient Prescriptions  Medication Sig Dispense Refill  . ALPRAZolam (XANAX) 0.5 MG tablet Take 0.5 mg by mouth as needed.        Marland Kitchen aspirin 81 MG EC tablet Take 81 mg by mouth daily.        . Multiple Vitamin (MULTIVITAMIN) tablet Take 1 tablet by mouth daily.        . NON FORMULARY Sea Cucumber Vit 500mg  - 1 tablet daily       . OLIVE LEAF EXTRACT PO Take 1 tablet by mouth 2 (two) times daily. 900mg  each      . ranitidine (ZANTAC) 150 MG capsule Take 150 mg by mouth daily.       No current facility-administered medications for this visit.    History   Social History  . Marital Status: Married    Spouse Name: N/A    Number of Children: N/A  . Years of Education: N/A   Occupational History  . Retired    Social History Main Topics  . Smoking status: Never Smoker   . Smokeless tobacco: Never Used  . Alcohol Use: Yes  . Drug Use: No  . Sexual Activity: Not on file   Other Topics Concern  . Not on file   Social History Narrative  . No narrative on file    Family History  Problem Relation Age of Onset  . Emphysema Father   . Heart murmur Mother     Past Medical History  Diagnosis Date  . GERD (gastroesophageal reflux disease)     Patient sometimes has slight right lower jaw discomfort at the time of her reflux  . LBBB (left bundle branch block)   . Ejection fraction     55-60%, echo, and December, 2009, septal  dyssynergy  . Aortic insufficiency     Mild, echo, November, 2007 /mild, echo, December, 2009  . Shoulder pain     Left scapular pain, appeared to be radicular  . Blood pressure alteration     Historically pressure normal, April I. normal blood pressure in the office / recorded blood pressures from home normal  . Chest pain     Nuclear March, 2011, normal, ejection fraction 64%  . Pericardial effusion     Echo December, 2009, small, incidental  . Dyslipidemia     Past Surgical History  Procedure Laterality Date  . Cholecystectomy  1984  . Breast biopsy  1983    benign  . Nasal septum surgery  1993  . 25 gauge pars plana vitrectomy with 20 gauge mvr port for macular hole  2000  . Rotator cuff repair  2002  . Melanoma excision  2012, 2013    Patient Active Problem List   Diagnosis Date Noted  . Dyslipidemia   . Blood pressure alteration   . GERD (gastroesophageal reflux disease)   . Ejection fraction   . Aortic insufficiency   .  Shoulder pain   . Chest pain   . Pericardial effusion   . LBBB 01/03/2010    ROS   Patient denies fever, chills, headache, sweats, rash, change in vision, change in hearing, chest pain, cough, nausea or vomiting, urinary symptoms. All other systems are reviewed and are negative.  PHYSICAL EXAM  Patient's oriented to person time and place. Affect is normal. She looks quite good today. Head is atraumatic. Sclera and conjunctiva are normal. There is no jugulovenous distention. Lungs are clear. Respiratory effort is nonlabored. Cardiac exam reveals S1 and S2. I do not hear any significant murmurs. The abdomen is soft. There is no peripheral edema. There no musculoskeletal deformities. There are no skin rashes.  Filed Vitals:   03/06/14 0802  BP: 130/85  Pulse: 63  Height: 5\' 6"  (1.676 m)  Weight: 135 lb (61.236 kg)   EKG is done today and reviewed by me and compared to a prior tracing. There is no change. She has an interventricular conduction  delay of the left bundle branch block type. There is sinus rhythm.  ASSESSMENT & PLAN

## 2014-03-06 NOTE — Assessment & Plan Note (Signed)
Her symptoms are nicely controlled with Zantac.

## 2014-03-06 NOTE — Assessment & Plan Note (Signed)
She has an old interventricular conduction delay of left bundle branch type. There is no change. No further workup.

## 2014-03-06 NOTE — Assessment & Plan Note (Signed)
She's not having any recurring chest pain. No further workup.

## 2014-03-06 NOTE — Assessment & Plan Note (Signed)
Patient is at mild aortic insufficiency in the past. I do not hear any significant murmurs at this time. I feel that we should consider an echo when I see her next year.

## 2014-03-06 NOTE — Assessment & Plan Note (Signed)
Blood pressure stable. No change in therapy.

## 2014-03-06 NOTE — Patient Instructions (Signed)
**Note De-identified Sharesa Kemp Obfuscation** Your physician recommends that you continue on your current medications as directed. Please refer to the Current Medication list given to you today.  Your physician wants you to follow-up in: 1 year. You will receive a reminder letter in the mail two months in advance. If you don't receive a letter, please call our office to schedule the follow-up appointment.  

## 2014-03-06 NOTE — Assessment & Plan Note (Signed)
This is followed by her primary physician. She felt very poorly with Pravachol.

## 2014-03-16 DIAGNOSIS — D239 Other benign neoplasm of skin, unspecified: Secondary | ICD-10-CM | POA: Diagnosis not present

## 2014-03-16 DIAGNOSIS — Z85820 Personal history of malignant melanoma of skin: Secondary | ICD-10-CM | POA: Diagnosis not present

## 2014-03-16 DIAGNOSIS — L821 Other seborrheic keratosis: Secondary | ICD-10-CM | POA: Diagnosis not present

## 2014-03-16 DIAGNOSIS — Z85828 Personal history of other malignant neoplasm of skin: Secondary | ICD-10-CM | POA: Diagnosis not present

## 2014-03-16 DIAGNOSIS — L819 Disorder of pigmentation, unspecified: Secondary | ICD-10-CM | POA: Diagnosis not present

## 2014-04-22 DIAGNOSIS — R51 Headache: Secondary | ICD-10-CM | POA: Diagnosis not present

## 2014-04-22 DIAGNOSIS — M5030 Other cervical disc degeneration, unspecified cervical region: Secondary | ICD-10-CM | POA: Diagnosis not present

## 2014-05-13 DIAGNOSIS — J3489 Other specified disorders of nose and nasal sinuses: Secondary | ICD-10-CM | POA: Diagnosis not present

## 2014-05-13 DIAGNOSIS — H6120 Impacted cerumen, unspecified ear: Secondary | ICD-10-CM | POA: Diagnosis not present

## 2014-05-13 DIAGNOSIS — R51 Headache: Secondary | ICD-10-CM | POA: Diagnosis not present

## 2014-06-02 DIAGNOSIS — H2640 Unspecified secondary cataract: Secondary | ICD-10-CM | POA: Diagnosis not present

## 2014-06-02 DIAGNOSIS — H43819 Vitreous degeneration, unspecified eye: Secondary | ICD-10-CM | POA: Diagnosis not present

## 2014-06-02 DIAGNOSIS — H35349 Macular cyst, hole, or pseudohole, unspecified eye: Secondary | ICD-10-CM | POA: Diagnosis not present

## 2014-06-17 DIAGNOSIS — Z85820 Personal history of malignant melanoma of skin: Secondary | ICD-10-CM | POA: Diagnosis not present

## 2014-06-17 DIAGNOSIS — Z85828 Personal history of other malignant neoplasm of skin: Secondary | ICD-10-CM | POA: Diagnosis not present

## 2014-06-17 DIAGNOSIS — L821 Other seborrheic keratosis: Secondary | ICD-10-CM | POA: Diagnosis not present

## 2014-06-17 DIAGNOSIS — D1801 Hemangioma of skin and subcutaneous tissue: Secondary | ICD-10-CM | POA: Diagnosis not present

## 2014-06-17 DIAGNOSIS — L820 Inflamed seborrheic keratosis: Secondary | ICD-10-CM | POA: Diagnosis not present

## 2014-06-17 DIAGNOSIS — D239 Other benign neoplasm of skin, unspecified: Secondary | ICD-10-CM | POA: Diagnosis not present

## 2014-06-18 ENCOUNTER — Other Ambulatory Visit: Payer: Self-pay

## 2014-06-18 DIAGNOSIS — Z1231 Encounter for screening mammogram for malignant neoplasm of breast: Secondary | ICD-10-CM

## 2014-06-25 DIAGNOSIS — E785 Hyperlipidemia, unspecified: Secondary | ICD-10-CM | POA: Diagnosis not present

## 2014-06-25 DIAGNOSIS — M899 Disorder of bone, unspecified: Secondary | ICD-10-CM | POA: Diagnosis not present

## 2014-06-25 DIAGNOSIS — Z0000 Encounter for general adult medical examination without abnormal findings: Secondary | ICD-10-CM | POA: Diagnosis not present

## 2014-06-26 DIAGNOSIS — Z0000 Encounter for general adult medical examination without abnormal findings: Secondary | ICD-10-CM | POA: Diagnosis not present

## 2014-07-02 DIAGNOSIS — Z23 Encounter for immunization: Secondary | ICD-10-CM | POA: Diagnosis not present

## 2014-07-02 DIAGNOSIS — Z1331 Encounter for screening for depression: Secondary | ICD-10-CM | POA: Diagnosis not present

## 2014-07-02 DIAGNOSIS — IMO0002 Reserved for concepts with insufficient information to code with codable children: Secondary | ICD-10-CM | POA: Diagnosis not present

## 2014-07-02 DIAGNOSIS — M899 Disorder of bone, unspecified: Secondary | ICD-10-CM | POA: Diagnosis not present

## 2014-07-02 DIAGNOSIS — K449 Diaphragmatic hernia without obstruction or gangrene: Secondary | ICD-10-CM | POA: Diagnosis not present

## 2014-07-02 DIAGNOSIS — M949 Disorder of cartilage, unspecified: Secondary | ICD-10-CM | POA: Diagnosis not present

## 2014-07-02 DIAGNOSIS — E785 Hyperlipidemia, unspecified: Secondary | ICD-10-CM | POA: Diagnosis not present

## 2014-07-02 DIAGNOSIS — Z0000 Encounter for general adult medical examination without abnormal findings: Secondary | ICD-10-CM | POA: Diagnosis not present

## 2014-07-02 DIAGNOSIS — I359 Nonrheumatic aortic valve disorder, unspecified: Secondary | ICD-10-CM | POA: Diagnosis not present

## 2014-07-10 DIAGNOSIS — Z1212 Encounter for screening for malignant neoplasm of rectum: Secondary | ICD-10-CM | POA: Diagnosis not present

## 2014-07-22 ENCOUNTER — Ambulatory Visit
Admission: RE | Admit: 2014-07-22 | Discharge: 2014-07-22 | Disposition: A | Discharge status: Home / Self Care | Payer: Medicare Other | Source: Ambulatory Visit

## 2014-07-22 ENCOUNTER — Encounter (INDEPENDENT_AMBULATORY_CARE_PROVIDER_SITE_OTHER): Payer: Self-pay

## 2014-07-22 DIAGNOSIS — Z1231 Encounter for screening mammogram for malignant neoplasm of breast: Secondary | ICD-10-CM

## 2014-08-14 DIAGNOSIS — Z23 Encounter for immunization: Secondary | ICD-10-CM | POA: Diagnosis not present

## 2014-08-24 DIAGNOSIS — Z961 Presence of intraocular lens: Secondary | ICD-10-CM | POA: Diagnosis not present

## 2014-08-27 DIAGNOSIS — Z780 Asymptomatic menopausal state: Secondary | ICD-10-CM | POA: Diagnosis not present

## 2014-08-27 DIAGNOSIS — Z01419 Encounter for gynecological examination (general) (routine) without abnormal findings: Secondary | ICD-10-CM | POA: Diagnosis not present

## 2014-10-14 DIAGNOSIS — D225 Melanocytic nevi of trunk: Secondary | ICD-10-CM | POA: Diagnosis not present

## 2014-10-14 DIAGNOSIS — Z85820 Personal history of malignant melanoma of skin: Secondary | ICD-10-CM | POA: Diagnosis not present

## 2014-10-14 DIAGNOSIS — D2271 Melanocytic nevi of right lower limb, including hip: Secondary | ICD-10-CM | POA: Diagnosis not present

## 2014-10-14 DIAGNOSIS — D2272 Melanocytic nevi of left lower limb, including hip: Secondary | ICD-10-CM | POA: Diagnosis not present

## 2014-10-14 DIAGNOSIS — L821 Other seborrheic keratosis: Secondary | ICD-10-CM | POA: Diagnosis not present

## 2014-10-14 DIAGNOSIS — D2262 Melanocytic nevi of left upper limb, including shoulder: Secondary | ICD-10-CM | POA: Diagnosis not present

## 2014-10-14 DIAGNOSIS — L820 Inflamed seborrheic keratosis: Secondary | ICD-10-CM | POA: Diagnosis not present

## 2014-10-14 DIAGNOSIS — Z85828 Personal history of other malignant neoplasm of skin: Secondary | ICD-10-CM | POA: Diagnosis not present

## 2014-11-02 ENCOUNTER — Encounter: Payer: Self-pay | Admitting: Cardiology

## 2014-11-02 ENCOUNTER — Ambulatory Visit (INDEPENDENT_AMBULATORY_CARE_PROVIDER_SITE_OTHER): Payer: Medicare Other | Admitting: Cardiology

## 2014-11-02 VITALS — BP 126/82 | HR 77 | Ht 65.0 in | Wt 135.0 lb

## 2014-11-02 DIAGNOSIS — I351 Nonrheumatic aortic (valve) insufficiency: Secondary | ICD-10-CM | POA: Diagnosis not present

## 2014-11-02 DIAGNOSIS — R6889 Other general symptoms and signs: Secondary | ICD-10-CM

## 2014-11-02 DIAGNOSIS — R072 Precordial pain: Secondary | ICD-10-CM

## 2014-11-02 NOTE — Assessment & Plan Note (Signed)
Left bundle branch block is old. No change in therapy.

## 2014-11-02 NOTE — Assessment & Plan Note (Signed)
She's been having some chest discomfort. It is very vague. It occurs only at nighttime. At this point I'm not convinced of significant angina. I've given her reassurance. I've asked her to continue her full activities. She exercises regularly with no problems. I will see her back in 3 months for an earlier follow-up to be sure that she's not had any change in her symptoms. She noticed to contact me if she has any significant changes.

## 2014-11-02 NOTE — Patient Instructions (Addendum)
Your physician recommends that you continue on your current medications as directed. Please refer to the Current Medication list given to you today.  Your physician has requested that you have an echocardiogram. Echocardiography is a painless test that uses sound waves to create images of your heart. It provides your doctor with information about the size and shape of your heart and how well your heart's chambers and valves are working. This procedure takes approximately one hour. There are no restrictions for this procedure.  Your physician wants you to follow-up in: 3 months. You will receive a reminder letter in the mail two months in advance. If you don't receive a letter, please call our office to schedule the follow-up appointment.

## 2014-11-02 NOTE — Progress Notes (Signed)
Patient ID: Julia [name], female   DOB: 1936-03-25, 79 y.o.   MRN: [id number]    HPI  The patient is seen to follow-up vague chest discomfort. I have followed her over time. I saw her last May, 2015. She had a nuclear study in 2011 showing no abnormalities. She does have GERD. She has a history of mild aortic insufficiency and her last echo was 2009. Recently she's had some friends with unexpected coronary disease events. She is concerned and wanted to review her current symptoms. At nighttime only she will have a vague discomfort in her chest from time to time. She might also feels something in her right jaw on a very limited basis. All of the symptoms get better when she has a drink of water. She has no exertional symptoms.  Allergies  Allergen Reactions  . Pravastatin Other (See Comments)    Current Outpatient Prescriptions  Medication Sig Dispense Refill  . ALPRAZolam (XANAX) 0.5 MG tablet Take 0.5 mg by mouth as needed.      Marland Kitchen aspirin 81 MG EC tablet Take 81 mg by mouth daily.      . Fish Oil OIL Take 1,600 mg by mouth.    . meclizine (ANTIVERT) 25 MG tablet as needed.    . Melatonin 3 MG CAPS Take by mouth as needed.    . Multiple Vitamin (MULTIVITAMIN) tablet Take 1 tablet by mouth daily.      . NON FORMULARY Sea Cucumber Vit 500mg  - 1 tablet daily     . OLIVE LEAF EXTRACT PO Take 1 tablet by mouth 2 (two) times daily. 900mg  each    . ranitidine (ZANTAC) 150 MG capsule Take 150 mg by mouth as needed.      No current facility-administered medications for this visit.    History   Social History  . Marital Status: Married    Spouse Name: N/A    Number of Children: N/A  . Years of Education: N/A   Occupational History  . Retired    Social History Main Topics  . Smoking status: Never Smoker   . Smokeless tobacco: Never Used  . Alcohol Use: Yes  . Drug Use: No  . Sexual Activity: Not on file   Other Topics Concern  . Not on file   Social History Narrative    Family  History  Problem Relation Age of Onset  . Emphysema Father   . Heart murmur Mother     Past Medical History  Diagnosis Date  . GERD (gastroesophageal reflux disease)     Patient sometimes has slight right lower jaw discomfort at the time of her reflux  . LBBB (left bundle branch block)   . Ejection fraction     55-60%, echo, and December, 2009, septal dyssynergy  . Aortic insufficiency     Mild, echo, November, 2007 /mild, echo, December, 2009  . Shoulder pain     Left scapular pain, appeared to be radicular  . Blood pressure alteration     Historically pressure normal, April I. normal blood pressure in the office / recorded blood pressures from home normal  . Chest pain     Nuclear March, 2011, normal, ejection fraction 64%  . Pericardial effusion     Echo December, 2009, small, incidental  . Dyslipidemia     Past Surgical History  Procedure Laterality Date  . Cholecystectomy  1984  . Breast biopsy  1983    benign  . Nasal septum surgery  1993  .  25 gauge pars plana vitrectomy with 20 gauge mvr port for macular hole  2000  . Rotator cuff repair  2002  . Melanoma excision  2012, 2013    Patient Active Problem List   Diagnosis Date Noted  . Dyslipidemia   . Blood pressure alteration   . GERD (gastroesophageal reflux disease)   . Ejection fraction   . Aortic insufficiency   . Shoulder pain   . Chest pain   . Pericardial effusion   . LBBB 01/03/2010    ROS  Patient denies fever, chills, headache, sweats, rash, change in vision, change in hearing, cough, nausea or vomiting, urinary symptoms. All other systems are reviewed and are negative.  PHYSICAL EXAM Patient is oriented to person time and place. Affect is normal. Head is atraumatic. Sclera and conjunctiva are normal. There is no jugular venous distention. Lungs are clear. Respiratory effort is not labored. Cardiac exam reveals S1 and S2. There is a soft murmur of aortic insufficiency. The abdomen is soft. There  is no peripheral edema.  Filed Vitals:   11/02/14 0834  BP: 126/82  Pulse: 77  Height: 5\' 5"  (1.651 m)  Weight: 135 lb (61.236 kg)   EKG is done today and reviewed by me. There is old left bundle branch block. There is normal sinus rhythm.  ASSESSMENT & PLAN

## 2014-11-02 NOTE — Assessment & Plan Note (Signed)
Her last echo was 2009. It is time for a follow-up echo. This will be scheduled. I'll be in touch with her with the results.

## 2014-11-04 ENCOUNTER — Ambulatory Visit (HOSPITAL_COMMUNITY): Payer: Medicare Other | Attending: Cardiology

## 2014-11-04 DIAGNOSIS — I351 Nonrheumatic aortic (valve) insufficiency: Secondary | ICD-10-CM

## 2014-11-04 NOTE — Progress Notes (Signed)
2D Echo completed. 11/04/2014 

## 2014-11-09 ENCOUNTER — Encounter: Payer: Self-pay | Admitting: Cardiology

## 2014-11-12 DIAGNOSIS — H35341 Macular cyst, hole, or pseudohole, right eye: Secondary | ICD-10-CM | POA: Diagnosis not present

## 2014-11-12 DIAGNOSIS — H33002 Unspecified retinal detachment with retinal break, left eye: Secondary | ICD-10-CM | POA: Diagnosis not present

## 2014-11-12 DIAGNOSIS — Z961 Presence of intraocular lens: Secondary | ICD-10-CM | POA: Diagnosis not present

## 2014-11-22 ENCOUNTER — Encounter: Payer: Self-pay | Admitting: Cardiology

## 2014-12-04 ENCOUNTER — Encounter: Payer: Self-pay | Admitting: Cardiology

## 2015-02-22 ENCOUNTER — Ambulatory Visit (INDEPENDENT_AMBULATORY_CARE_PROVIDER_SITE_OTHER): Payer: Medicare Other | Admitting: Cardiology

## 2015-02-22 ENCOUNTER — Encounter: Payer: Self-pay | Admitting: Cardiology

## 2015-02-22 VITALS — BP 128/82 | HR 75 | Ht 65.0 in | Wt 133.6 lb

## 2015-02-22 DIAGNOSIS — R072 Precordial pain: Secondary | ICD-10-CM | POA: Diagnosis not present

## 2015-02-22 DIAGNOSIS — I3139 Other pericardial effusion (noninflammatory): Secondary | ICD-10-CM

## 2015-02-22 DIAGNOSIS — I447 Left bundle-branch block, unspecified: Secondary | ICD-10-CM

## 2015-02-22 DIAGNOSIS — I319 Disease of pericardium, unspecified: Secondary | ICD-10-CM | POA: Diagnosis not present

## 2015-02-22 DIAGNOSIS — I351 Nonrheumatic aortic (valve) insufficiency: Secondary | ICD-10-CM | POA: Diagnosis not present

## 2015-02-22 DIAGNOSIS — I313 Pericardial effusion (noninflammatory): Secondary | ICD-10-CM

## 2015-02-22 DIAGNOSIS — R6889 Other general symptoms and signs: Secondary | ICD-10-CM

## 2015-02-22 NOTE — Assessment & Plan Note (Signed)
Patient has mild aortic insufficiency per echo in January, 2016. This is unchanged over many years.

## 2015-02-22 NOTE — Assessment & Plan Note (Signed)
She has a long-standing trivial posterior pericardial effusion. No further workup.

## 2015-02-22 NOTE — Progress Notes (Signed)
Cardiology Office Note   Date:  02/22/2015   ID:  Ginna, Schuur Dec 05, 1935, MRN [id number]  PCP:  Donnajean Lopes, MD  Cardiologist:  Dola Argyle, MD   Chief Complaint  Patient presents with  . Appointment    Follow-up history of chest discomfort.      History of Present Illness: Julia [name] is a 79 y.o. female who presents to follow-up some mild chest discomfort and mild aortic insufficiency. I saw her last January, 2016. We decided that that time to do a follow-up 2-D echo. She has underlying left bundle branch block. The echo was done January, 2016. 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 She has an EF of 50-55%. Her aortic insufficiency remains mild. She also has a trivial pericardial effusion that she has had in the past. She has not had any significant chest pain.    Past Medical History  Diagnosis Date  . GERD (gastroesophageal reflux disease)     Patient sometimes has slight right lower jaw discomfort at the time of her reflux  . LBBB (left bundle branch block)   . Ejection fraction     55-60%, echo, and December, 2009, septal dyssynergy  . Aortic insufficiency     Mild, echo, November, 2007 /mild, echo, December, 2009  . Shoulder pain     Left scapular pain, appeared to be radicular  . Blood pressure alteration     Historically pressure normal, April I. normal blood pressure in the office / recorded blood pressures from home normal  . Chest pain     Nuclear March, 2011, normal, ejection fraction 64%  . Pericardial effusion     Echo December, 2009, small, incidental  . Dyslipidemia     Past Surgical History  Procedure Laterality Date  . Cholecystectomy  1984  . Breast biopsy  1983    benign  . Nasal septum surgery  1993  . 25 gauge pars plana vitrectomy with 20 gauge mvr port for macular hole  2000  . Rotator cuff repair  2002  . Melanoma excision  2012, 2013    Patient Active Problem List   Diagnosis Date Noted    . Dyslipidemia   . Blood pressure alteration   . GERD (gastroesophageal reflux disease)   . Ejection fraction   . Aortic insufficiency   . Shoulder pain   . Chest pain   . Pericardial effusion   . LBBB 01/03/2010      Current Outpatient Prescriptions  Medication Sig Dispense Refill  . ALPRAZolam (XANAX) 0.5 MG tablet Take 0.5 mg by mouth as needed.      Marland Kitchen aspirin 81 MG EC tablet Take 81 mg by mouth daily.      . Cholecalciferol (VITAMIN D3) 2000 UNITS capsule Take 2,000 Units by mouth daily.    . Fish Oil OIL Take 1,600 mg by mouth.    . lactobacillus acidophilus (BACID) TABS tablet Take 2 tablets by mouth daily. Per pt capsule contains 30 million flora    . magnesium gluconate (MAGONATE) 500 MG tablet Take 500 mg by mouth daily.    . meclizine (ANTIVERT) 25 MG tablet as needed.    . Melatonin 3 MG CAPS Take by mouth as needed.    . Multiple Vitamin (MULTIVITAMIN) tablet Take 1 tablet by mouth daily.      Marland Kitchen OLIVE LEAF EXTRACT PO Take 1 tablet by mouth 2 (two) times daily. 900mg  each    . OVER THE COUNTER  MEDICATION     . ranitidine (ZANTAC) 150 MG capsule Take 150 mg by mouth as needed.      No current facility-administered medications for this visit.    Allergies:   Pravastatin    Social History:  The patient  reports that she has never smoked. She has never used smokeless tobacco. She reports that she drinks alcohol. She reports that she does not use illicit drugs.   Family History:  The patient's family history includes Emphysema in her father; Heart murmur in her mother.    ROS:  Please see the history of present illness.   Patient denies fever, chills, headache, sweats, rash, change in vision, change in hearing, chest pain, cough, nausea or vomiting, urinary symptoms. All other systems are reviewed and are negative.     PHYSICAL EXAM: VS:  BP 128/82 mmHg  Pulse 75  Ht 5\' 5"  (1.651 m)  Wt 133 lb 9.6 oz (60.601 kg)  BMI 22.23 kg/m2  SpO2 97% , Patient is oriented  to person time and place. Affect is normal. Head is atraumatic. Sclera and conjunctiva are normal. There is no jugular venous distention. Lungs are clear. Respiratory effort is nonlabored. Cardiac exam reveals S1 and S2. The abdomen is soft. There is no peripheral edema. There are no musculoskeletal deformities. There are no skin rashes. Neurologic is grossly intact.  EKG:   EKG is not done today.   Recent Labs: No results found for requested labs within last 365 days.    Lipid Panel No results found for: CHOL, TRIG, HDL, CHOLHDL, VLDL, LDLCALC, LDLDIRECT    Wt Readings from Last 3 Encounters:  02/22/15 133 lb 9.6 oz (60.601 kg)  11/02/14 135 lb (61.236 kg)  03/06/14 135 lb (61.236 kg)      Current medicines are reviewed  The patient understands her medications.     ASSESSMENT AND PLAN:

## 2015-02-22 NOTE — Assessment & Plan Note (Signed)
There is no significant chest pain. No further workup.

## 2015-02-22 NOTE — Assessment & Plan Note (Signed)
She has old left bundle-branch block. There is septal dyssynergy by echo. No further workup.

## 2015-02-22 NOTE — Patient Instructions (Signed)
Medication Instructions:  None  Labwork: None  Testing/Procedures: None  Follow-Up: Your physician wants you to follow-up in: 1 year. You will receive a reminder letter in the mail two months in advance. If you don't receive a letter, please call our office to schedule the follow-up appointment.

## 2015-02-22 NOTE — Assessment & Plan Note (Signed)
Blood pressure is stable today. No further workup.

## 2015-04-23 DIAGNOSIS — D485 Neoplasm of uncertain behavior of skin: Secondary | ICD-10-CM | POA: Diagnosis not present

## 2015-04-23 DIAGNOSIS — Z85820 Personal history of malignant melanoma of skin: Secondary | ICD-10-CM | POA: Diagnosis not present

## 2015-04-23 DIAGNOSIS — D2271 Melanocytic nevi of right lower limb, including hip: Secondary | ICD-10-CM | POA: Diagnosis not present

## 2015-04-23 DIAGNOSIS — Z85828 Personal history of other malignant neoplasm of skin: Secondary | ICD-10-CM | POA: Diagnosis not present

## 2015-04-23 DIAGNOSIS — L570 Actinic keratosis: Secondary | ICD-10-CM | POA: Diagnosis not present

## 2015-04-23 DIAGNOSIS — L821 Other seborrheic keratosis: Secondary | ICD-10-CM | POA: Diagnosis not present

## 2015-04-23 DIAGNOSIS — D2262 Melanocytic nevi of left upper limb, including shoulder: Secondary | ICD-10-CM | POA: Diagnosis not present

## 2015-04-23 DIAGNOSIS — L814 Other melanin hyperpigmentation: Secondary | ICD-10-CM | POA: Diagnosis not present

## 2015-04-23 DIAGNOSIS — D2272 Melanocytic nevi of left lower limb, including hip: Secondary | ICD-10-CM | POA: Diagnosis not present

## 2015-04-26 ENCOUNTER — Other Ambulatory Visit: Payer: Self-pay

## 2015-05-25 ENCOUNTER — Other Ambulatory Visit: Payer: Self-pay

## 2015-05-25 DIAGNOSIS — Z1231 Encounter for screening mammogram for malignant neoplasm of breast: Secondary | ICD-10-CM

## 2015-06-02 ENCOUNTER — Telehealth: Payer: Self-pay | Admitting: Cardiology

## 2015-06-02 NOTE — Telephone Encounter (Signed)
Spoke with pt and she states that most nights she is waking up once slightly gasping for air. Pt states it just last a second and once she sits up and takes a deep breath, she is fine.  Pt denies CP, lightheadedness, dizziness, fatigue, weakness. Pt denies heartburn. Pt denies snoring. Informed pt that I would route this information to Dr. Ron Parker for review and advisement. Pt also states that she would like to start seeing Dr. Irish Lack once Dr. Gweneth Dimitri. Pt would like to see Dr. Ron Parker to discuss this issue that is occuring at night. Next available was offered but pt would like to be seen sooner. Will also route to [facility], Dr. Ron Parker nurse, to follow up with possible sooner appt.

## 2015-06-02 NOTE — Telephone Encounter (Signed)
If she can not wait until I have a time on my schedule to see her, arrange for her to be seen by PA /NP team.

## 2015-06-02 NOTE — Telephone Encounter (Signed)
NewMessage  Pt called to make appt w/ Dr. Ron Parker, and then c/o of SoB. Can leave detailed VM. Please call back and discuss.   Pt c/o Shortness Of Breath: STAT if SOB developed within the last 24 hours or pt is noticeably SOB on the phone  1. Are you currently SOB (can you hear that pt is SOB on the phone)? no  2. How long have you been experiencing SOB? Few months   3. Are you SOB when sitting or when up moving around? At night in bed   4. Are you currently experiencing any other symptoms? no

## 2015-06-03 NOTE — Telephone Encounter (Signed)
The pt was offered an appt with Dr Ron Parker on 9/23 at 2:15 and she accepted it. She thanked me for my assistance.

## 2015-06-28 DIAGNOSIS — H43392 Other vitreous opacities, left eye: Secondary | ICD-10-CM | POA: Diagnosis not present

## 2015-06-29 DIAGNOSIS — Z85820 Personal history of malignant melanoma of skin: Secondary | ICD-10-CM | POA: Diagnosis not present

## 2015-06-29 DIAGNOSIS — Z85828 Personal history of other malignant neoplasm of skin: Secondary | ICD-10-CM | POA: Diagnosis not present

## 2015-06-29 DIAGNOSIS — L910 Hypertrophic scar: Secondary | ICD-10-CM | POA: Diagnosis not present

## 2015-07-06 DIAGNOSIS — Z961 Presence of intraocular lens: Secondary | ICD-10-CM | POA: Diagnosis not present

## 2015-07-06 DIAGNOSIS — H43812 Vitreous degeneration, left eye: Secondary | ICD-10-CM | POA: Diagnosis not present

## 2015-07-06 DIAGNOSIS — H35341 Macular cyst, hole, or pseudohole, right eye: Secondary | ICD-10-CM | POA: Diagnosis not present

## 2015-07-06 DIAGNOSIS — H33302 Unspecified retinal break, left eye: Secondary | ICD-10-CM | POA: Diagnosis not present

## 2015-07-19 DIAGNOSIS — R5383 Other fatigue: Secondary | ICD-10-CM | POA: Diagnosis not present

## 2015-07-19 DIAGNOSIS — M859 Disorder of bone density and structure, unspecified: Secondary | ICD-10-CM | POA: Diagnosis not present

## 2015-07-19 DIAGNOSIS — E784 Other hyperlipidemia: Secondary | ICD-10-CM | POA: Diagnosis not present

## 2015-07-19 DIAGNOSIS — E785 Hyperlipidemia, unspecified: Secondary | ICD-10-CM | POA: Diagnosis not present

## 2015-07-19 DIAGNOSIS — Z0000 Encounter for general adult medical examination without abnormal findings: Secondary | ICD-10-CM | POA: Diagnosis not present

## 2015-07-20 ENCOUNTER — Encounter: Payer: Self-pay | Admitting: *Deleted

## 2015-07-23 ENCOUNTER — Encounter: Payer: Self-pay | Admitting: Cardiology

## 2015-07-23 ENCOUNTER — Ambulatory Visit (INDEPENDENT_AMBULATORY_CARE_PROVIDER_SITE_OTHER): Payer: Medicare Other | Admitting: Cardiology

## 2015-07-23 VITALS — BP 100/70 | HR 76 | Ht 66.0 in | Wt 129.0 lb

## 2015-07-23 DIAGNOSIS — I351 Nonrheumatic aortic (valve) insufficiency: Secondary | ICD-10-CM

## 2015-07-23 DIAGNOSIS — R0602 Shortness of breath: Secondary | ICD-10-CM | POA: Diagnosis not present

## 2015-07-23 NOTE — Progress Notes (Signed)
Cardiology Office Note   Date:  07/23/2015   ID:  Skila, Rollins 1935-11-19, MRN [id number]  PCP:  Donnajean Lopes, MD  Cardiologist:  Dola Argyle, MD   Chief Complaint  Patient presents with  . Appointment    Follow-up chest discomfort      History of Present Illness: Julia [name] is a 78 y.o. female who presents today to follow-up some mild chest discomfort and mild aortic insufficiency. I saw her last April, 2016. At that time she was stable. Plans were made for a one-year follow-up. However she had some very limited shortness of breath and came in for earlier follow-up. She is not limited in any way. At times she notices that she takes a deep breath and this concerned her. There is no significant chest pain.    Past Medical History  Diagnosis Date  . GERD (gastroesophageal reflux disease)     Patient sometimes has slight right lower jaw discomfort at the time of her reflux  . LBBB (left bundle branch block)   . Ejection fraction     55-60%, echo, and December, 2009, septal dyssynergy  . Aortic insufficiency     Mild, echo, November, 2007 /mild, echo, December, 2009  . Shoulder pain     Left scapular pain, appeared to be radicular  . Blood pressure alteration     Historically pressure normal, April I. normal blood pressure in the office / recorded blood pressures from home normal  . Chest pain     Nuclear March, 2011, normal, ejection fraction 64%  . Pericardial effusion     Echo December, 2009, small, incidental  . Dyslipidemia     Past Surgical History  Procedure Laterality Date  . Cholecystectomy  1984  . Breast biopsy  1983    benign  . Nasal septum surgery  1993  . 25 gauge pars plana vitrectomy with 20 gauge mvr port for macular hole  2000  . Rotator cuff repair  2002  . Melanoma excision  2012, 2013    Patient Active Problem List   Diagnosis Date Noted  . Dyslipidemia   . Blood pressure alteration   . GERD (gastroesophageal reflux  disease)   . Ejection fraction   . Aortic insufficiency   . Shoulder pain   . Chest pain   . Pericardial effusion   . Left bundle branch block 01/03/2010      Current Outpatient Prescriptions  Medication Sig Dispense Refill  . ALPRAZolam (XANAX) 0.5 MG tablet Take 0.5 mg by mouth as needed.      Marland Kitchen aspirin 81 MG EC tablet Take 81 mg by mouth daily.      . Cholecalciferol (VITAMIN D3) 2000 UNITS capsule Take 2,000 Units by mouth daily.    Marland Kitchen lactobacillus acidophilus (BACID) TABS tablet Take 2 tablets by mouth daily. Per pt capsule contains 30 million flora    . Lutein 10 MG TABS Take 10 mg by mouth daily.    . meclizine (ANTIVERT) 25 MG tablet as needed.    . Melatonin 3 MG CAPS Take by mouth as needed.    . Multiple Vitamin (MULTIVITAMIN) tablet Take 1 tablet by mouth daily.      Marland Kitchen OLIVE LEAF EXTRACT PO Take 1 tablet by mouth 2 (two) times daily. 900mg  each    . Omega-3 Fatty Acids (FISH OIL CONCENTRATE PO) Take by mouth.    Marland Kitchen OVER THE COUNTER MEDICATION      No current facility-administered medications for  this visit.    Allergies:   Pravastatin    Social History:  The patient  reports that she has never smoked. She has never used smokeless tobacco. She reports that she drinks alcohol. She reports that she does not use illicit drugs.   Family History:  The patient's family history includes COPD in her father; Emphysema in her father; Heart murmur in her mother; Pneumonia in her father and mother. There is no history of Heart attack, Hypertension, or Stroke.    ROS:  Please see the history of present illness.   Patient denies fever, chills, headache, sweats, rash, change in vision, change in hearing, chest pain, cough, nausea or vomiting, urinary symptoms. All other systems are reviewed and are negative.     PHYSICAL EXAM: VS:  BP 100/70 mmHg  Pulse 76  Ht 5\' 6"  (1.676 m)  Wt 129 lb (58.514 kg)  BMI 20.83 kg/m2  SpO2 97% , The patient is quite stable today. She is oriented  to person time and place. Affect is normal. Head is atraumatic. Sclera and conjunctiva are normal. There is no jugulovenous distention. Lungs are clear. Respiratory effort is not labored. Cardiac exam reveals an S1 and S2. The abdomen is soft. There is no peripheral edema. There are no musculoskeletal deformities. There are no skin rashes.   EKG:   EKG is not done today.   Recent Labs: No results found for requested labs within last 365 days.    Lipid Panel No results found for: CHOL, TRIG, HDL, CHOLHDL, VLDL, LDLCALC, LDLDIRECT    Wt Readings from Last 3 Encounters:  07/23/15 129 lb (58.514 kg)  02/22/15 133 lb 9.6 oz (60.601 kg)  11/02/14 135 lb (61.236 kg)      Current medicines are reviewed  The patient understands her medications.     ASSESSMENT AND PLAN:

## 2015-07-23 NOTE — Assessment & Plan Note (Signed)
The patient has mild aortic insufficiency redocumented by echo in January, 2016. No further workup.

## 2015-07-23 NOTE — Patient Instructions (Signed)
Medication Instructions:  Same-no changes  Labwork: None  Testing/Procedures: None  Follow-Up: Your physician wants you to follow-up in: 4 months with Dr Irish Lack. You will receive a reminder letter in the mail two months in advance. If you don't receive a letter, please call our office to schedule the follow-up appointment.

## 2015-07-23 NOTE — Assessment & Plan Note (Signed)
Today the patient describes a sensation of shortness of breath. She actually notices times when she takes a deep breath when this is unexpected. She does not have any exertional symptoms. I feel that her symptoms represent no significant medical problem. I have reassured her. No further workup.

## 2015-07-28 DIAGNOSIS — Z0000 Encounter for general adult medical examination without abnormal findings: Secondary | ICD-10-CM | POA: Diagnosis not present

## 2015-07-28 DIAGNOSIS — E785 Hyperlipidemia, unspecified: Secondary | ICD-10-CM | POA: Diagnosis not present

## 2015-07-28 DIAGNOSIS — I351 Nonrheumatic aortic (valve) insufficiency: Secondary | ICD-10-CM | POA: Diagnosis not present

## 2015-07-28 DIAGNOSIS — M859 Disorder of bone density and structure, unspecified: Secondary | ICD-10-CM | POA: Diagnosis not present

## 2015-07-28 DIAGNOSIS — Z6821 Body mass index (BMI) 21.0-21.9, adult: Secondary | ICD-10-CM | POA: Diagnosis not present

## 2015-07-28 DIAGNOSIS — Z1212 Encounter for screening for malignant neoplasm of rectum: Secondary | ICD-10-CM | POA: Diagnosis not present

## 2015-07-28 DIAGNOSIS — M5030 Other cervical disc degeneration, unspecified cervical region: Secondary | ICD-10-CM | POA: Diagnosis not present

## 2015-07-28 DIAGNOSIS — Z1389 Encounter for screening for other disorder: Secondary | ICD-10-CM | POA: Diagnosis not present

## 2015-07-28 DIAGNOSIS — K449 Diaphragmatic hernia without obstruction or gangrene: Secondary | ICD-10-CM | POA: Diagnosis not present

## 2015-07-29 ENCOUNTER — Ambulatory Visit
Admission: RE | Admit: 2015-07-29 | Discharge: 2015-07-29 | Disposition: A | Discharge status: Home / Self Care | Payer: Medicare Other | Source: Ambulatory Visit

## 2015-07-29 DIAGNOSIS — Z1231 Encounter for screening mammogram for malignant neoplasm of breast: Secondary | ICD-10-CM | POA: Diagnosis not present

## 2015-09-02 DIAGNOSIS — Z780 Asymptomatic menopausal state: Secondary | ICD-10-CM | POA: Diagnosis not present

## 2015-09-02 DIAGNOSIS — Z01419 Encounter for gynecological examination (general) (routine) without abnormal findings: Secondary | ICD-10-CM | POA: Diagnosis not present

## 2015-10-04 ENCOUNTER — Encounter (HOSPITAL_COMMUNITY): Payer: Self-pay | Admitting: *Deleted

## 2015-10-04 ENCOUNTER — Emergency Department (HOSPITAL_COMMUNITY)
Admission: EM | Admit: 2015-10-04 | Discharge: 2015-10-04 | Disposition: A | Discharge status: Home / Self Care | Payer: Medicare Other | Attending: Emergency Medicine | Admitting: Emergency Medicine

## 2015-10-04 ENCOUNTER — Emergency Department (HOSPITAL_COMMUNITY): Payer: Medicare Other

## 2015-10-04 DIAGNOSIS — S99912A Unspecified injury of left ankle, initial encounter: Secondary | ICD-10-CM | POA: Diagnosis not present

## 2015-10-04 DIAGNOSIS — Z8719 Personal history of other diseases of the digestive system: Secondary | ICD-10-CM | POA: Diagnosis not present

## 2015-10-04 DIAGNOSIS — Z79899 Other long term (current) drug therapy: Secondary | ICD-10-CM | POA: Insufficient documentation

## 2015-10-04 DIAGNOSIS — Z7982 Long term (current) use of aspirin: Secondary | ICD-10-CM | POA: Diagnosis not present

## 2015-10-04 DIAGNOSIS — S80211A Abrasion, right knee, initial encounter: Secondary | ICD-10-CM

## 2015-10-04 DIAGNOSIS — W108XXA Fall (on) (from) other stairs and steps, initial encounter: Secondary | ICD-10-CM | POA: Insufficient documentation

## 2015-10-04 DIAGNOSIS — Y9289 Other specified places as the place of occurrence of the external cause: Secondary | ICD-10-CM | POA: Insufficient documentation

## 2015-10-04 DIAGNOSIS — W19XXXA Unspecified fall, initial encounter: Secondary | ICD-10-CM

## 2015-10-04 DIAGNOSIS — Y9389 Activity, other specified: Secondary | ICD-10-CM | POA: Insufficient documentation

## 2015-10-04 DIAGNOSIS — Y998 Other external cause status: Secondary | ICD-10-CM | POA: Insufficient documentation

## 2015-10-04 DIAGNOSIS — Z8639 Personal history of other endocrine, nutritional and metabolic disease: Secondary | ICD-10-CM | POA: Diagnosis not present

## 2015-10-04 DIAGNOSIS — M7989 Other specified soft tissue disorders: Secondary | ICD-10-CM | POA: Diagnosis not present

## 2015-10-04 DIAGNOSIS — S99922A Unspecified injury of left foot, initial encounter: Secondary | ICD-10-CM | POA: Diagnosis present

## 2015-10-04 MED ORDER — NAPROXEN 375 MG PO TABS: 375.0000 mg | ORAL_TABLET | Freq: Two times a day (BID) | ORAL | Status: DC

## 2015-10-04 NOTE — ED Provider Notes (Signed)
CSN: [id number]     Arrival date & time 10/04/15  1300 History  By signing my name below, I, Sonum Patel, attest that this documentation has been prepared under the direction and in the presence of Margarita Mail, PA-C. Electronically Signed: Sonum Patel, Education administrator. 10/04/2015. 1:33 PM.    No chief complaint on file.  The history is provided by the patient. No language interpreter was used.     HPI Comments: Julia [name] is a 79 y.o. female who presents to the Emergency Department complaining of left foot injury that occurred PTA. Patient states she was walking on steps when the injury occurred. She rates her pain 8-9/10 at max; states the pain is not present at rest. She has taken Tylenol 600 mg with mild relief. She denies anti-coagulant use aside from a daily ASA 81mg . She denies calf pain, numbness.   Past Medical History  Diagnosis Date  . GERD (gastroesophageal reflux disease)     Patient sometimes has slight right lower jaw discomfort at the time of her reflux  . LBBB (left bundle branch block)   . Ejection fraction     55-60%, echo, and December, 2009, septal dyssynergy  . Aortic insufficiency     Mild, echo, November, 2007 /mild, echo, December, 2009  . Shoulder pain     Left scapular pain, appeared to be radicular  . Blood pressure alteration     Historically pressure normal, April I. normal blood pressure in the office / recorded blood pressures from home normal  . Chest pain     Nuclear March, 2011, normal, ejection fraction 64%  . Pericardial effusion     Echo December, 2009, small, incidental  . Dyslipidemia    Past Surgical History  Procedure Laterality Date  . Cholecystectomy  1984  . Breast biopsy  1983    benign  . Nasal septum surgery  1993  . 25 gauge pars plana vitrectomy with 20 gauge mvr port for macular hole  2000  . Rotator cuff repair  2002  . Melanoma excision  2012, 2013   Family History  Problem Relation Age of Onset  . Emphysema Father   . Heart  murmur Mother   . Pneumonia Mother   . Pneumonia Father   . COPD Father   . Heart attack Neg Hx   . Hypertension Neg Hx   . Stroke Neg Hx    Social History  Substance Use Topics  . Smoking status: Never Smoker   . Smokeless tobacco: Never Used  . Alcohol Use: Yes   OB History    No data available     Review of Systems  Musculoskeletal: Positive for arthralgias.  Skin: Negative for wound.  Neurological: Negative for weakness and numbness.    Allergies  Pravastatin  Home Medications   Prior to Admission medications   Medication Sig Start Date End Date Taking? Authorizing Provider  ALPRAZolam Duanne Moron) 0.5 MG tablet Take 0.5 mg by mouth as needed.      Historical Provider, MD  aspirin 81 MG EC tablet Take 81 mg by mouth daily.      Historical Provider, MD  Cholecalciferol (VITAMIN D3) 2000 UNITS capsule Take 2,000 Units by mouth daily.    Historical Provider, MD  lactobacillus acidophilus (BACID) TABS tablet Take 2 tablets by mouth daily. Per pt capsule contains 30 million flora    Historical Provider, MD  Lutein 10 MG TABS Take 10 mg by mouth daily.    Historical Provider, MD  meclizine (ANTIVERT) 25 MG tablet as needed. 09/06/14   Historical Provider, MD  Melatonin 3 MG CAPS Take by mouth as needed.    Historical Provider, MD  Multiple Vitamin (MULTIVITAMIN) tablet Take 1 tablet by mouth daily.      Historical Provider, MD  OLIVE LEAF EXTRACT PO Take 1 tablet by mouth 2 (two) times daily. 900mg  each    Historical Provider, MD  Omega-3 Fatty Acids (FISH OIL CONCENTRATE PO) Take by mouth.    Historical Provider, MD  [place]     Historical Provider, MD   BP 161/87 mmHg  Pulse 77  Temp(Src) 98.2 F (36.8 C) (Oral)  Resp 16  Ht 5' 5.5" (1.664 m)  Wt 130 lb (58.968 kg)  BMI 21.30 kg/m2  SpO2 98% Physical Exam Physical Exam  Constitutional: Pt appears well-developed and well-nourished. No distress.  HENT:  Head: Normocephalic and atraumatic.  Eyes:  Conjunctivae are normal.  Neck: Normal range of motion.  Cardiovascular: Normal rate, regular rhythm and intact DP/PT pulses.   Capillary refill < 3 sec  Pulmonary/Chest: Effort normal and breath sounds normal.  Musculoskeletal: Pt exhibits tenderness with passive ROM of the ankle. No bony tenderness. Pt exhibits no edema.  ROM: limited due to pain  Neurological: Pt  is alert. Coordination normal.  Sensation inact Strength normal  Skin: Skin is warm and dry. Pt is not diaphoretic.  No tenting of the skin  Psychiatric: Pt has a normal mood and affect.  Nursing note and vitals reviewed.  ED Course  Procedures (including critical care time)  DIAGNOSTIC STUDIES: Oxygen Saturation is 98% on RA, normal by my interpretation.    COORDINATION OF CARE: 1:37 PM Discussed treatment plan with pt at bedside and pt agreed to plan.   Labs Review Labs Reviewed - No data to display  Imaging Review No results found. I have personally reviewed and evaluated these images as part of my medical decision-making.   EKG Interpretation None      MDM   Final diagnoses:  Ankle injury, left, initial encounter  Knee abrasion, right, initial encounter  Fall, initial encounter   Patient X-Ray negative for obvious fracture or dislocation.  Pt advised to follow up with orthopedics. Patient given CAM walker and crutches while in ED, conservative therapy recommended and discussed. Patient will be discharged home & is agreeable with above plan. Returns precautions discussed. Pt appears safe for discharge.   I personally performed the services described in this documentation, which was scribed in my presence. The recorded information has been reviewed and is accurate.       Margarita Mail, PA-C 10/08/15 [place], MD 10/09/15 [phone]

## 2015-10-04 NOTE — ED Notes (Signed)
Declined W/C at D/C and was escorted to lobby by RN. 

## 2015-10-04 NOTE — Discharge Instructions (Signed)
Ankle Sprain  An ankle sprain is an injury to the strong, fibrous tissues (ligaments) that hold the bones of your ankle joint together.   CAUSES  An ankle sprain is usually caused by a fall or by twisting your ankle. Ankle sprains most commonly occur when you step on the outer edge of your foot, and your ankle turns inward. People who participate in sports are more prone to these types of injuries.   SYMPTOMS    Pain in your ankle. The pain may be present at rest or only when you are trying to stand or walk.   Swelling.   Bruising. Bruising may develop immediately or within 1 to 2 days after your injury.   Difficulty standing or walking, particularly when turning corners or changing directions.  DIAGNOSIS   Your caregiver will ask you details about your injury and perform a physical exam of your ankle to determine if you have an ankle sprain. During the physical exam, your caregiver will press on and apply pressure to specific areas of your foot and ankle. Your caregiver will try to move your ankle in certain ways. An X-ray exam may be done to be sure a bone was not broken or a ligament did not separate from one of the bones in your ankle (avulsion fracture).   TREATMENT   Certain types of braces can help stabilize your ankle. Your caregiver can make a recommendation for this. Your caregiver may recommend the use of medicine for pain. If your sprain is severe, your caregiver may refer you to a surgeon who helps to restore function to parts of your skeletal system (orthopedist) or a physical therapist.  HOME CARE INSTRUCTIONS    Apply ice to your injury for 1-2 days or as directed by your caregiver. Applying ice helps to reduce inflammation and pain.    Put ice in a plastic bag.    Place a towel between your skin and the bag.    Leave the ice on for 15-20 minutes at a time, every 2 hours while you are awake.   Only take over-the-counter or prescription medicines for pain, discomfort, or fever as directed by  your caregiver.   Elevate your injured ankle above the level of your heart as much as possible for 2-3 days.   If your caregiver recommends crutches, use them as instructed. Gradually put weight on the affected ankle. Continue to use crutches or a cane until you can walk without feeling pain in your ankle.   If you have a plaster splint, wear the splint as directed by your caregiver. Do not rest it on anything harder than a pillow for the first 24 hours. Do not put weight on it. Do not get it wet. You may take it off to take a shower or bath.   You may have been given an elastic bandage to wear around your ankle to provide support. If the elastic bandage is too tight (you have numbness or tingling in your foot or your foot becomes cold and blue), adjust the bandage to make it comfortable.   If you have an air splint, you may blow more air into it or let air out to make it more comfortable. You may take your splint off at night and before taking a shower or bath. Wiggle your toes in the splint several times per day to decrease swelling.  SEEK MEDICAL CARE IF:    You have rapidly increasing bruising or swelling.   Your toes feel   extremely cold or you lose feeling in your foot.   Your pain is not relieved with medicine.  SEEK IMMEDIATE MEDICAL CARE IF:   Your toes are numb or blue.   You have severe pain that is increasing.  MAKE SURE YOU:    Understand these instructions.   Will watch your condition.   Will get help right away if you are not doing well or get worse.     This information is not intended to replace advice given to you by your health care provider. Make sure you discuss any questions you have with your health care provider.     Document Released: 10/16/2005 Document Revised: 11/06/2014 Document Reviewed: 10/28/2011  Elsevier Interactive Patient Education 2016 Elsevier Inc[employer]

## 2015-10-04 NOTE — ED Notes (Signed)
Pt reports falling on stair this AM and now has pain in LT foot during ambulation.

## 2015-10-04 NOTE — ED Notes (Signed)
See PA assessment 

## 2015-10-04 NOTE — ED Notes (Signed)
PT reported she is going to use her neighboro's cam walker. Pt arrived  To ED with mentioned cam walker earlier.

## 2015-10-06 DIAGNOSIS — M25572 Pain in left ankle and joints of left foot: Secondary | ICD-10-CM | POA: Diagnosis not present

## 2015-11-02 DIAGNOSIS — Z85820 Personal history of malignant melanoma of skin: Secondary | ICD-10-CM | POA: Diagnosis not present

## 2015-11-02 DIAGNOSIS — D2262 Melanocytic nevi of left upper limb, including shoulder: Secondary | ICD-10-CM | POA: Diagnosis not present

## 2015-11-02 DIAGNOSIS — L910 Hypertrophic scar: Secondary | ICD-10-CM | POA: Diagnosis not present

## 2015-11-02 DIAGNOSIS — L821 Other seborrheic keratosis: Secondary | ICD-10-CM | POA: Diagnosis not present

## 2015-11-02 DIAGNOSIS — D225 Melanocytic nevi of trunk: Secondary | ICD-10-CM | POA: Diagnosis not present

## 2015-11-02 DIAGNOSIS — D1801 Hemangioma of skin and subcutaneous tissue: Secondary | ICD-10-CM | POA: Diagnosis not present

## 2015-11-02 DIAGNOSIS — Z85828 Personal history of other malignant neoplasm of skin: Secondary | ICD-10-CM | POA: Diagnosis not present

## 2015-11-02 DIAGNOSIS — Z23 Encounter for immunization: Secondary | ICD-10-CM | POA: Diagnosis not present

## 2015-11-03 DIAGNOSIS — M25572 Pain in left ankle and joints of left foot: Secondary | ICD-10-CM | POA: Diagnosis not present

## 2015-11-18 ENCOUNTER — Encounter: Payer: Self-pay | Admitting: Interventional Cardiology

## 2015-11-18 ENCOUNTER — Ambulatory Visit (INDEPENDENT_AMBULATORY_CARE_PROVIDER_SITE_OTHER): Payer: Medicare Other | Admitting: Interventional Cardiology

## 2015-11-18 VITALS — BP 122/60 | HR 84 | Ht 65.5 in | Wt 129.0 lb

## 2015-11-18 DIAGNOSIS — I447 Left bundle-branch block, unspecified: Secondary | ICD-10-CM | POA: Diagnosis not present

## 2015-11-18 DIAGNOSIS — I351 Nonrheumatic aortic (valve) insufficiency: Secondary | ICD-10-CM

## 2015-11-18 NOTE — Progress Notes (Signed)
Patient ID: Julia [name], female   DOB: 10/01/1936, 80 y.o.   MRN: [id number]     Cardiology Office Note   Date:  11/18/2015   ID:  Julia [name], DOB 1935/11/23, MRN [id number]  PCP:  Donnajean Lopes, MD    No chief complaint on file. aortic regurgitation   Wt Readings from Last 3 Encounters:  11/18/15 129 lb (58.514 kg)  10/04/15 130 lb (58.968 kg)  07/23/15 129 lb (58.514 kg)       History of Present Illness: Julia [name] is a 80 y.o. female  Who has had a LBBB and mild aortic insufficiency.  She had a normal stress test in 2011.  Echo in 2016 showed normal LV function with mild AI.  She will be restarting her cardio exerise program after havng torn foot ligaments.  She denies any chest discomfort or shortness of breath. Overall, she is feeling well.    Past Medical History  Diagnosis Date  . GERD (gastroesophageal reflux disease)     Patient sometimes has slight right lower jaw discomfort at the time of her reflux  . LBBB (left bundle branch block)   . Ejection fraction     55-60%, echo, and December, 2009, septal dyssynergy  . Aortic insufficiency     Mild, echo, November, 2007 /mild, echo, December, 2009  . Shoulder pain     Left scapular pain, appeared to be radicular  . Blood pressure alteration     Historically pressure normal, April I. normal blood pressure in the office / recorded blood pressures from home normal  . Chest pain     Nuclear March, 2011, normal, ejection fraction 64%  . Pericardial effusion     Echo December, 2009, small, incidental  . Dyslipidemia     Past Surgical History  Procedure Laterality Date  . Cholecystectomy  1984  . Breast biopsy  1983    benign  . Nasal septum surgery  1993  . 25 gauge pars plana vitrectomy with 20 gauge mvr port for macular hole  2000  . Rotator cuff repair  2002  . Melanoma excision  2012, 2013     Current Outpatient Prescriptions  Medication Sig Dispense Refill  . ALPRAZolam (XANAX) 0.5 MG  tablet Take 0.5 mg by mouth as needed for anxiety.     Marland Kitchen aspirin 81 MG EC tablet Take 81 mg by mouth daily.      . Cholecalciferol (VITAMIN D3) 2000 UNITS capsule Take 2,000 Units by mouth daily.    Marland Kitchen lactobacillus acidophilus (BACID) TABS tablet Take 2 tablets by mouth daily. Per pt capsule contains 30 million flora    . meclizine (ANTIVERT) 25 MG tablet as needed.    . Melatonin 3 MG CAPS Take 1 capsule by mouth as needed (DIZZY).     . Multiple Vitamin (MULTIVITAMIN) tablet Take 4 tablets by mouth daily.     Marland Kitchen OLIVE LEAF EXTRACT PO Take 1 tablet by mouth 2 (two) times daily. 900mg  each    . Omega-3 Fatty Acids (FISH OIL CONCENTRATE PO) Take 1,600 mg by mouth daily.     Marland Kitchen OVER THE COUNTER MEDICATION Take 1 tablet by mouth daily. Reported on 11/18/2015.Marland KitchenMarland Kitchenprobiotic     No current facility-administered medications for this visit.    Allergies:   Pravastatin    Social History:  The patient  reports that she has never smoked. She has never used smokeless tobacco. She reports that she drinks alcohol. She reports that she does not  use illicit drugs.   Family History:  The patient's family history includes COPD in her father; Emphysema in her father; Heart murmur in her mother; Pneumonia in her father and mother. There is no history of Heart attack, Hypertension, or Stroke.    ROS:  Please see the history of present illness.   Otherwise, review of systems are positive for ocasional, one second gasp- then resolves spontaneously.   All other systems are reviewed and negative.    PHYSICAL EXAM: VS:  BP 122/60 mmHg  Pulse 84  Ht 5' 5.5" (1.664 m)  Wt 129 lb (58.514 kg)  BMI 21.13 kg/m2 , BMI Body mass index is 21.13 kg/(m^2). GEN: Well nourished, well developed, in no acute distress HEENT: normal Neck: no JVD, carotid bruits, or masses Cardiac: RRR; no murmurs, rubs, or gallops,no edema , 1/6 diastolic murmur Respiratory:  clear to auscultation bilaterally, normal work of breathing GI: soft,  nontender, nondistended, + BS MS: no deformity or atrophy Skin: warm and dry, no rash Neuro:  Strength and sensation are intact Psych: euthymic mood, full affect    Recent Labs: No results found for requested labs within last 365 days.   Lipid Panel No results found for: CHOL, TRIG, HDL, CHOLHDL, VLDL, LDLCALC, LDLDIRECT   Other studies Reviewed: Additional studies/ records that were reviewed today with results demonstrating: prior echo report reviewed.   ASSESSMENT AND PLAN:  1. AI: mild by last echo.  EF 50-55%.  No sx.  Watch for change in exercise tolerance.  No need for SBE prophylaxis.  2. LBBB: Septal dyssynergy noted don the echo, related to the LBBB 3. Lipids followed by Dr. Philip Aspen.    Current medicines are reviewed at length with the patient today.  The patient concerns regarding her medicines were addressed.  The following changes have been made:  No change  Labs/ tests ordered today include:  No orders of the defined types were placed in this encounter.    Recommend 150 minutes/week of aerobic exercise Low fat, low carb, high fiber diet recommended  Disposition:   FU in 1 year   [name]., MD  11/18/2015 2:21 PM    [facility] [place], [place],   [address] Phone: [phone]; Fax: [phone]

## 2015-11-18 NOTE — Patient Instructions (Signed)
**Note De-identified Julia [name] Obfuscation** Medication Instructions:  Same-no changes  Labwork: None  Testing/Procedures: None  Follow-Up: Your physician wants you to follow-up in: 1 year. You will receive a reminder letter in the mail two months in advance. If you don't receive a letter, please call our office to schedule the follow-up appointment.      If you need a refill on your cardiac medications before your next appointment, please call your pharmacy.   

## 2015-12-01 DIAGNOSIS — Z961 Presence of intraocular lens: Secondary | ICD-10-CM | POA: Diagnosis not present

## 2015-12-01 DIAGNOSIS — H43812 Vitreous degeneration, left eye: Secondary | ICD-10-CM | POA: Diagnosis not present

## 2015-12-06 ENCOUNTER — Encounter: Payer: Medicare Other | Admitting: Interventional Cardiology

## 2015-12-09 DIAGNOSIS — Z6821 Body mass index (BMI) 21.0-21.9, adult: Secondary | ICD-10-CM | POA: Diagnosis not present

## 2015-12-09 DIAGNOSIS — H6123 Impacted cerumen, bilateral: Secondary | ICD-10-CM | POA: Diagnosis not present

## 2015-12-09 DIAGNOSIS — J309 Allergic rhinitis, unspecified: Secondary | ICD-10-CM | POA: Diagnosis not present

## 2015-12-15 DIAGNOSIS — M25572 Pain in left ankle and joints of left foot: Secondary | ICD-10-CM | POA: Diagnosis not present

## 2016-01-07 DIAGNOSIS — M25561 Pain in right knee: Secondary | ICD-10-CM | POA: Diagnosis not present

## 2016-02-15 DIAGNOSIS — M810 Age-related osteoporosis without current pathological fracture: Secondary | ICD-10-CM | POA: Diagnosis not present

## 2016-02-15 DIAGNOSIS — M8589 Other specified disorders of bone density and structure, multiple sites: Secondary | ICD-10-CM | POA: Diagnosis not present

## 2016-03-14 DIAGNOSIS — M810 Age-related osteoporosis without current pathological fracture: Secondary | ICD-10-CM | POA: Diagnosis not present

## 2016-03-14 DIAGNOSIS — Z6821 Body mass index (BMI) 21.0-21.9, adult: Secondary | ICD-10-CM | POA: Diagnosis not present

## 2016-05-08 DIAGNOSIS — L821 Other seborrheic keratosis: Secondary | ICD-10-CM | POA: Diagnosis not present

## 2016-05-08 DIAGNOSIS — D225 Melanocytic nevi of trunk: Secondary | ICD-10-CM | POA: Diagnosis not present

## 2016-05-08 DIAGNOSIS — Z85820 Personal history of malignant melanoma of skin: Secondary | ICD-10-CM | POA: Diagnosis not present

## 2016-05-08 DIAGNOSIS — D2272 Melanocytic nevi of left lower limb, including hip: Secondary | ICD-10-CM | POA: Diagnosis not present

## 2016-05-08 DIAGNOSIS — D2262 Melanocytic nevi of left upper limb, including shoulder: Secondary | ICD-10-CM | POA: Diagnosis not present

## 2016-05-08 DIAGNOSIS — Z85828 Personal history of other malignant neoplasm of skin: Secondary | ICD-10-CM | POA: Diagnosis not present

## 2016-05-08 DIAGNOSIS — D2271 Melanocytic nevi of right lower limb, including hip: Secondary | ICD-10-CM | POA: Diagnosis not present

## 2016-05-08 DIAGNOSIS — L720 Epidermal cyst: Secondary | ICD-10-CM | POA: Diagnosis not present

## 2016-05-26 DIAGNOSIS — Z6822 Body mass index (BMI) 22.0-22.9, adult: Secondary | ICD-10-CM | POA: Diagnosis not present

## 2016-05-26 DIAGNOSIS — M799 Soft tissue disorder, unspecified: Secondary | ICD-10-CM | POA: Diagnosis not present

## 2016-06-26 ENCOUNTER — Other Ambulatory Visit: Payer: Self-pay | Admitting: Internal Medicine

## 2016-06-26 DIAGNOSIS — Z1231 Encounter for screening mammogram for malignant neoplasm of breast: Secondary | ICD-10-CM

## 2016-07-11 DIAGNOSIS — H43812 Vitreous degeneration, left eye: Secondary | ICD-10-CM | POA: Diagnosis not present

## 2016-07-11 DIAGNOSIS — H35341 Macular cyst, hole, or pseudohole, right eye: Secondary | ICD-10-CM | POA: Diagnosis not present

## 2016-07-11 DIAGNOSIS — H33002 Unspecified retinal detachment with retinal break, left eye: Secondary | ICD-10-CM | POA: Diagnosis not present

## 2016-07-11 DIAGNOSIS — Z961 Presence of intraocular lens: Secondary | ICD-10-CM | POA: Diagnosis not present

## 2016-07-25 ENCOUNTER — Ambulatory Visit (INDEPENDENT_AMBULATORY_CARE_PROVIDER_SITE_OTHER): Payer: Medicare Other | Admitting: Family Medicine

## 2016-07-25 VITALS — BP 129/80 | HR 84 | Wt 126.0 lb

## 2016-07-25 DIAGNOSIS — M25461 Effusion, right knee: Secondary | ICD-10-CM | POA: Diagnosis not present

## 2016-07-25 NOTE — Patient Instructions (Signed)
Thank you for coming in today. Apply ice and compression.  Return as needed.   Knee Effusion Knee effusion means that you have excess fluid in your knee joint. This can cause pain and swelling in your knee. This may make your knee more difficult to bend and move. That is because there is increased pain and pressure in the joint. If there is fluid in your knee, it often means that something is wrong inside your knee, such as severe arthritis, abnormal inflammation, or an infection. Another common cause of knee effusion is an injury to the knee muscles, ligaments, or cartilage. HOME CARE INSTRUCTIONS  Use crutches as directed by your health care provider.  Wear a knee brace as directed by your health care provider.  Apply ice to the swollen area:  Put ice in a plastic bag.  Place a towel between your skin and the bag.  Leave the ice on for 20 minutes, 2-3 times per day.  Keep your knee raised (elevated) when you are sitting or lying down.  Take medicines only as directed by your health care provider.  Do any rehabilitation or strengthening exercises as directed by your health care provider.  Rest your knee as directed by your health care provider. You may start doing your normal activities again when your health care provider approves.   Keep all follow-up visits as directed by your health care provider. This is important. SEEK MEDICAL CARE IF:  You have ongoing (persistent) pain in your knee. SEEK IMMEDIATE MEDICAL CARE IF:  You have increased swelling or redness of your knee.  You have severe pain in your knee.  You have a fever.   This information is not intended to replace advice given to you by your health care provider. Make sure you discuss any questions you have with your health care provider.   Document Released: 01/06/2004 Document Revised: 11/06/2014 Document Reviewed: 06/01/2014 Elsevier Interactive Patient Education [employer].

## 2016-07-25 NOTE — Progress Notes (Signed)
Julia [name] is a 80 y.o. female who presents to [facility] today for right knee swelling. Patient has a several week history of mild swelling of the right knee. This does not particularly hurt or bother Julia [name].  She denies any injury but does note she has been quite active recently. She denies any fevers or chills radiating pain weakness or numbness. She feels well otherwise.   Past Medical History:  Diagnosis Date  . Aortic insufficiency    Mild, echo, November, 2007 /mild, echo, December, 2009  . Blood pressure alteration    Historically pressure normal, April I. normal blood pressure in the office / recorded blood pressures from home normal  . Chest pain    Nuclear March, 2011, normal, ejection fraction 64%  . Dyslipidemia   . Ejection fraction    55-60%, echo, and December, 2009, septal dyssynergy  . GERD (gastroesophageal reflux disease)    Patient sometimes has slight right lower jaw discomfort at the time of her reflux  . LBBB (left bundle branch block)   . Pericardial effusion    Echo December, 2009, small, incidental  . Shoulder pain    Left scapular pain, appeared to be radicular   Past Surgical History:  Procedure Laterality Date  . [place] VITRECTOMY WITH 20 GAUGE MVR PORT FOR MACULAR HOLE  2000  . BREAST BIOPSY  1983   benign  . CHOLECYSTECTOMY  1984  . MELANOMA EXCISION  2012, 2013  . NASAL SEPTUM SURGERY  1993  . ROTATOR CUFF REPAIR  2002   Social History  Substance Use Topics  . Smoking status: Never Smoker  . Smokeless tobacco: Never Used  . Alcohol use Yes   family history includes COPD in her father; Emphysema in her father; Heart murmur in her mother; Pneumonia in her father and mother.  ROS:  No headache, visual changes, nausea, vomiting, diarrhea, constipation, dizziness, abdominal pain, skin rash, fevers, chills, night sweats, weight loss, swollen lymph nodes, body aches, joint swelling,  muscle aches, chest pain, shortness of breath, mood changes, visual or auditory hallucinations.    Medications: Current Outpatient Prescriptions  Medication Sig Dispense Refill  . ALPRAZolam (XANAX) 0.5 MG tablet Take 0.5 mg by mouth as needed for anxiety.     Marland Kitchen aspirin 81 MG EC tablet Take 81 mg by mouth daily.      . Cholecalciferol (VITAMIN D3) 2000 UNITS capsule Take 2,000 Units by mouth daily.    Marland Kitchen lactobacillus acidophilus (BACID) TABS tablet Take 2 tablets by mouth daily. Per pt capsule contains 30 million flora    . meclizine (ANTIVERT) 25 MG tablet as needed.    . Melatonin 3 MG CAPS Take 1 capsule by mouth as needed (DIZZY).     . Multiple Vitamin (MULTIVITAMIN) tablet Take 4 tablets by mouth daily.     Marland Kitchen OLIVE LEAF EXTRACT PO Take 1 tablet by mouth 2 (two) times daily. 900mg  each    . Omega-3 Fatty Acids (FISH OIL CONCENTRATE PO) Take 1,600 mg by mouth daily.     Marland Kitchen OVER THE COUNTER MEDICATION Take 1 tablet by mouth daily. Reported on 11/18/2015.Marland KitchenMarland Kitchenprobiotic     No current facility-administered medications for this visit.    Allergies  Allergen Reactions  . Pravastatin Other (See Comments)    It made me "looney tunes"     Exam:  BP 129/80   Pulse 84   Wt 126 lb (57.2 kg)   BMI 20.65  kg/m  General: Well Developed, well nourished, and in no acute distress.  Neuro/Psych: Alert and oriented x3, extra-ocular muscles intact, able to move all 4 extremities, sensation grossly intact. Skin: Warm and dry, no rashes noted.  Respiratory: Not using accessory muscles, speaking in full sentences, trachea midline.  Cardiovascular: Pulses palpable, no extremity edema. Abdomen: Does not appear distended. MSK: Right knee mild effusion otherwise normal-appearing with no skin erythema. Range of motion is normal 1-120 with 1+ retropatellar crepitations. Nontender. Stable ligamentous exam.  Negative McMurray's test. Intact flexion and extension strength.  Musculoskeletal ultrasound  shows a small effusion present in the suprapatellar space. Otherwise bony structures are intact and normal appearing. Meniscus at the medial lateral joint lines are normal appearing. Normal and intact appearing quadriceps and patellar tendons.   No results found for this or any previous visit (from the past 24 hour(s)). No results found.   Assessment and Plan: 80 y.o. female with right knee joint effusion likely due to arthritis. Patient is largely symptomatic. Recommend compression knee sleeve and ice as needed. Return as needed.   Discussed warning signs or symptoms. Please see discharge instructions. Patient expresses understanding.

## 2016-08-01 ENCOUNTER — Ambulatory Visit
Admission: RE | Admit: 2016-08-01 | Discharge: 2016-08-01 | Disposition: A | Discharge status: Home / Self Care | Payer: Medicare Other | Source: Ambulatory Visit | Attending: Internal Medicine | Admitting: Internal Medicine

## 2016-08-01 ENCOUNTER — Ambulatory Visit: Payer: Medicare Other

## 2016-08-01 DIAGNOSIS — Z1231 Encounter for screening mammogram for malignant neoplasm of breast: Secondary | ICD-10-CM | POA: Diagnosis not present

## 2016-08-03 DIAGNOSIS — R5383 Other fatigue: Secondary | ICD-10-CM | POA: Diagnosis not present

## 2016-08-03 DIAGNOSIS — E784 Other hyperlipidemia: Secondary | ICD-10-CM | POA: Diagnosis not present

## 2016-08-03 DIAGNOSIS — R5381 Other malaise: Secondary | ICD-10-CM | POA: Diagnosis not present

## 2016-08-03 DIAGNOSIS — M810 Age-related osteoporosis without current pathological fracture: Secondary | ICD-10-CM | POA: Diagnosis not present

## 2016-08-15 DIAGNOSIS — Z0000 Encounter for general adult medical examination without abnormal findings: Secondary | ICD-10-CM | POA: Diagnosis not present

## 2016-08-15 DIAGNOSIS — Z1389 Encounter for screening for other disorder: Secondary | ICD-10-CM | POA: Diagnosis not present

## 2016-08-15 DIAGNOSIS — I351 Nonrheumatic aortic (valve) insufficiency: Secondary | ICD-10-CM | POA: Diagnosis not present

## 2016-08-15 DIAGNOSIS — M799 Soft tissue disorder, unspecified: Secondary | ICD-10-CM | POA: Diagnosis not present

## 2016-08-15 DIAGNOSIS — Z6822 Body mass index (BMI) 22.0-22.9, adult: Secondary | ICD-10-CM | POA: Diagnosis not present

## 2016-08-15 DIAGNOSIS — Z23 Encounter for immunization: Secondary | ICD-10-CM | POA: Diagnosis not present

## 2016-08-15 DIAGNOSIS — M810 Age-related osteoporosis without current pathological fracture: Secondary | ICD-10-CM | POA: Diagnosis not present

## 2016-08-15 DIAGNOSIS — K449 Diaphragmatic hernia without obstruction or gangrene: Secondary | ICD-10-CM | POA: Diagnosis not present

## 2016-08-15 DIAGNOSIS — R030 Elevated blood-pressure reading, without diagnosis of hypertension: Secondary | ICD-10-CM | POA: Diagnosis not present

## 2016-08-15 DIAGNOSIS — E784 Other hyperlipidemia: Secondary | ICD-10-CM | POA: Diagnosis not present

## 2016-08-31 ENCOUNTER — Encounter: Payer: Self-pay | Admitting: Interventional Cardiology

## 2016-09-07 DIAGNOSIS — Z01419 Encounter for gynecological examination (general) (routine) without abnormal findings: Secondary | ICD-10-CM | POA: Diagnosis not present

## 2016-09-07 DIAGNOSIS — Z6821 Body mass index (BMI) 21.0-21.9, adult: Secondary | ICD-10-CM | POA: Diagnosis not present

## 2016-09-12 DIAGNOSIS — Z85828 Personal history of other malignant neoplasm of skin: Secondary | ICD-10-CM | POA: Diagnosis not present

## 2016-09-12 DIAGNOSIS — Z85820 Personal history of malignant melanoma of skin: Secondary | ICD-10-CM | POA: Diagnosis not present

## 2016-09-14 NOTE — Progress Notes (Signed)
Patient ID: Julia [name], female   DOB: 12-Aug-1936, 80 y.o.   MRN: [id number]     Cardiology Office Note   Date:  09/15/2016   ID:  Julia [name], DOB Mar 09, 1936, MRN [id number]  PCP:  Donnajean Lopes, MD    Chief Complaint  Patient presents with  . Follow-up    Hypertension  aortic regurgitation   Wt Readings from Last 3 Encounters:  09/15/16 60.1 kg (132 lb 6.4 oz)  07/25/16 57.2 kg (126 lb)  11/18/15 58.5 kg (129 lb)       History of Present Illness: Julia [name] is a 80 y.o. female  Who has had a LBBB and mild aortic insufficiency.  She had a normal stress test in 2011.  Echo in 2016 showed normal LV function with mild AI.  She will be restarting her cardio exerise program after havng torn foot ligaments.  She denies any chest discomfort or shortness of breath. Overall, she is feeling well.  She had increased readings and she has been checking at home.  Readings at home have been in the 120s/80s range.  She calculated an average of 126/83.  She has had readings as ow as 98 systolic and is concerned that meds would make her too low.  Readings between 11/9-11/17 have been very well controlled.   She was given a prescription for losartan/HCTZ 50/12.5 mg but did not want to take it.  BP in our office with her cuff correlated to our reading in the office.     Past Medical History:  Diagnosis Date  . Aortic insufficiency    Mild, echo, November, 2007 /mild, echo, December, 2009  . Blood pressure alteration    Historically pressure normal, April I. normal blood pressure in the office / recorded blood pressures from home normal  . Chest pain    Nuclear March, 2011, normal, ejection fraction 64%  . Dyslipidemia   . Ejection fraction    55-60%, echo, and December, 2009, septal dyssynergy  . GERD (gastroesophageal reflux disease)    Patient sometimes has slight right lower jaw discomfort at the time of her reflux  . LBBB (left bundle branch block)   . Pericardial  effusion    Echo December, 2009, small, incidental  . Shoulder pain    Left scapular pain, appeared to be radicular    Past Surgical History:  Procedure Laterality Date  . [place] VITRECTOMY WITH 20 GAUGE MVR PORT FOR MACULAR HOLE  2000  . BREAST BIOPSY  1983   benign  . CHOLECYSTECTOMY  1984  . MELANOMA EXCISION  2012, 2013  . NASAL SEPTUM SURGERY  1993  . ROTATOR CUFF REPAIR  2002     Current Outpatient Prescriptions  Medication Sig Dispense Refill  . ALPRAZolam (XANAX) 0.5 MG tablet Take 0.5 mg by mouth as needed for anxiety.     Marland Kitchen aspirin 81 MG EC tablet Take 81 mg by mouth daily.      . Cholecalciferol (VITAMIN D3) 2000 UNITS capsule Take 2,000 Units by mouth daily.    . Melatonin 1 MG/ML LIQD Take 5 mLs by mouth at bedtime.    . Multiple Vitamin (MULTIVITAMIN) tablet Take 1 tablet by mouth daily.     Marland Kitchen OLIVE LEAF EXTRACT PO Take 1 tablet by mouth 2 (two) times daily. 900mg  each    . Omega-3 Fatty Acids (FISH OIL CONCENTRATE PO) Take 1,600 mg by mouth daily.     Marland Kitchen OVER THE COUNTER MEDICATION  Take 1 capsule by mouth daily. ULTIMATE BONE SUPPORT    . TURMERIC PO Take 600 mg by mouth daily.     No current facility-administered medications for this visit.     Allergies:   Pravastatin    Social History:  The patient  reports that she has never smoked. She has never used smokeless tobacco. She reports that she drinks alcohol. She reports that she does not use drugs.   Family History:  The patient's family history includes COPD in her father; Emphysema in her father; Heart murmur in her mother; Pneumonia in her father and mother.    ROS:  Please see the history of present illness.   Otherwise, review of systems are positive for ocasional, one second gasp- then resolves spontaneously.   All other systems are reviewed and negative.    PHYSICAL EXAM: VS:  BP 138/86   Pulse 78   Ht 5\' 5"  (1.651 m)   Wt 60.1 kg (132 lb 6.4 oz)   BMI 22.03 kg/m  , BMI Body mass  index is 22.03 kg/m. GEN: Well nourished, well developed, in no acute distress  HEENT: normal  Neck: no JVD, carotid bruits, or masses Cardiac: RRR; no murmurs, rubs, or gallops,no edema , 1/6 diastolic murmur Respiratory:  clear to auscultation bilaterally, normal work of breathing GI: soft, nontender, nondistended, + BS MS: no deformity or atrophy  Skin: warm and dry, no rash Neuro:  Strength and sensation are intact Psych: euthymic mood, full affect    Recent Labs: No results found for requested labs within last 8760 hours.   Lipid Panel No results found for: CHOL, TRIG, HDL, CHOLHDL, VLDL, LDLCALC, LDLDIRECT   Other studies Reviewed: Additional studies/ records that were reviewed today with results demonstrating: prior echo report reviewed.   ASSESSMENT AND PLAN:  1. AI: mild by last echo.  EF 50-55%.  No sx.  Watch for change in exercise tolerance.  No need for SBE prophylaxis.  2. LBBB: Septal dyssynergy noted on the prior echo, related to the LBBB 3. Lipids followed by Dr. Philip Aspen.  4. BP well controlled by her machine at home which correlates to our reading in the office.  She will continue to check BP at home.  No meds at this time.  If readings increase, she can let us know.  Certainly there is a component of white coat HTN.   Current medicines are reviewed at length with the patient today.  The patient concerns regarding her medicines were addressed.  The following changes have been made:  No change  Labs/ tests ordered today include:  No orders of the defined types were placed in this encounter.   Recommend 150 minutes/week of aerobic exercise Low fat, low carb, high fiber diet recommended  Disposition:   FU in 1 year   Signed, Larae Grooms, MD  09/15/2016 11:11 AM    [facility] [place], [place], [place]  [address] Phone: [phone]; Fax: [phone]

## 2016-09-15 ENCOUNTER — Ambulatory Visit (INDEPENDENT_AMBULATORY_CARE_PROVIDER_SITE_OTHER): Payer: Medicare Other | Admitting: Interventional Cardiology

## 2016-09-15 ENCOUNTER — Encounter: Payer: Self-pay | Admitting: Interventional Cardiology

## 2016-09-15 VITALS — BP 138/86 | HR 78 | Ht 65.0 in | Wt 132.4 lb

## 2016-09-15 DIAGNOSIS — I447 Left bundle-branch block, unspecified: Secondary | ICD-10-CM | POA: Diagnosis not present

## 2016-09-15 DIAGNOSIS — R030 Elevated blood-pressure reading, without diagnosis of hypertension: Secondary | ICD-10-CM | POA: Diagnosis not present

## 2016-09-15 DIAGNOSIS — I351 Nonrheumatic aortic (valve) insufficiency: Secondary | ICD-10-CM

## 2016-09-15 DIAGNOSIS — E785 Hyperlipidemia, unspecified: Secondary | ICD-10-CM

## 2016-09-15 NOTE — Patient Instructions (Signed)

## 2016-09-29 IMAGING — CR DG FOOT COMPLETE 3+V*L*
3 series · 3 of 3 positions shown · non-contrast
Comparison: Left ankle series performed today.

CLINICAL DATA: Slipped on bottom step, twisting left ankle. Pain
and swelling.

EXAM:
LEFT FOOT - COMPLETE 3+ VIEW

[foot ap]
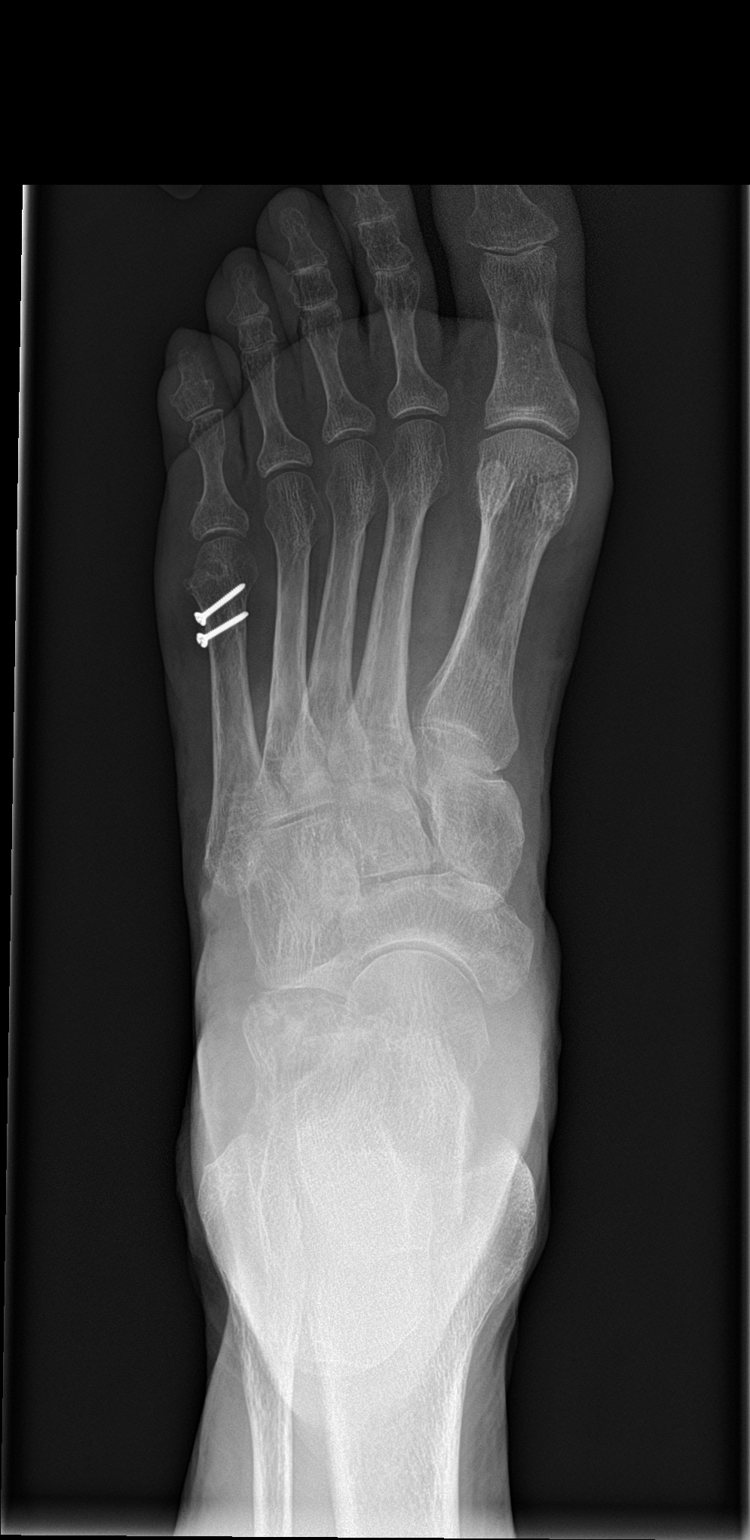

[foot obl]
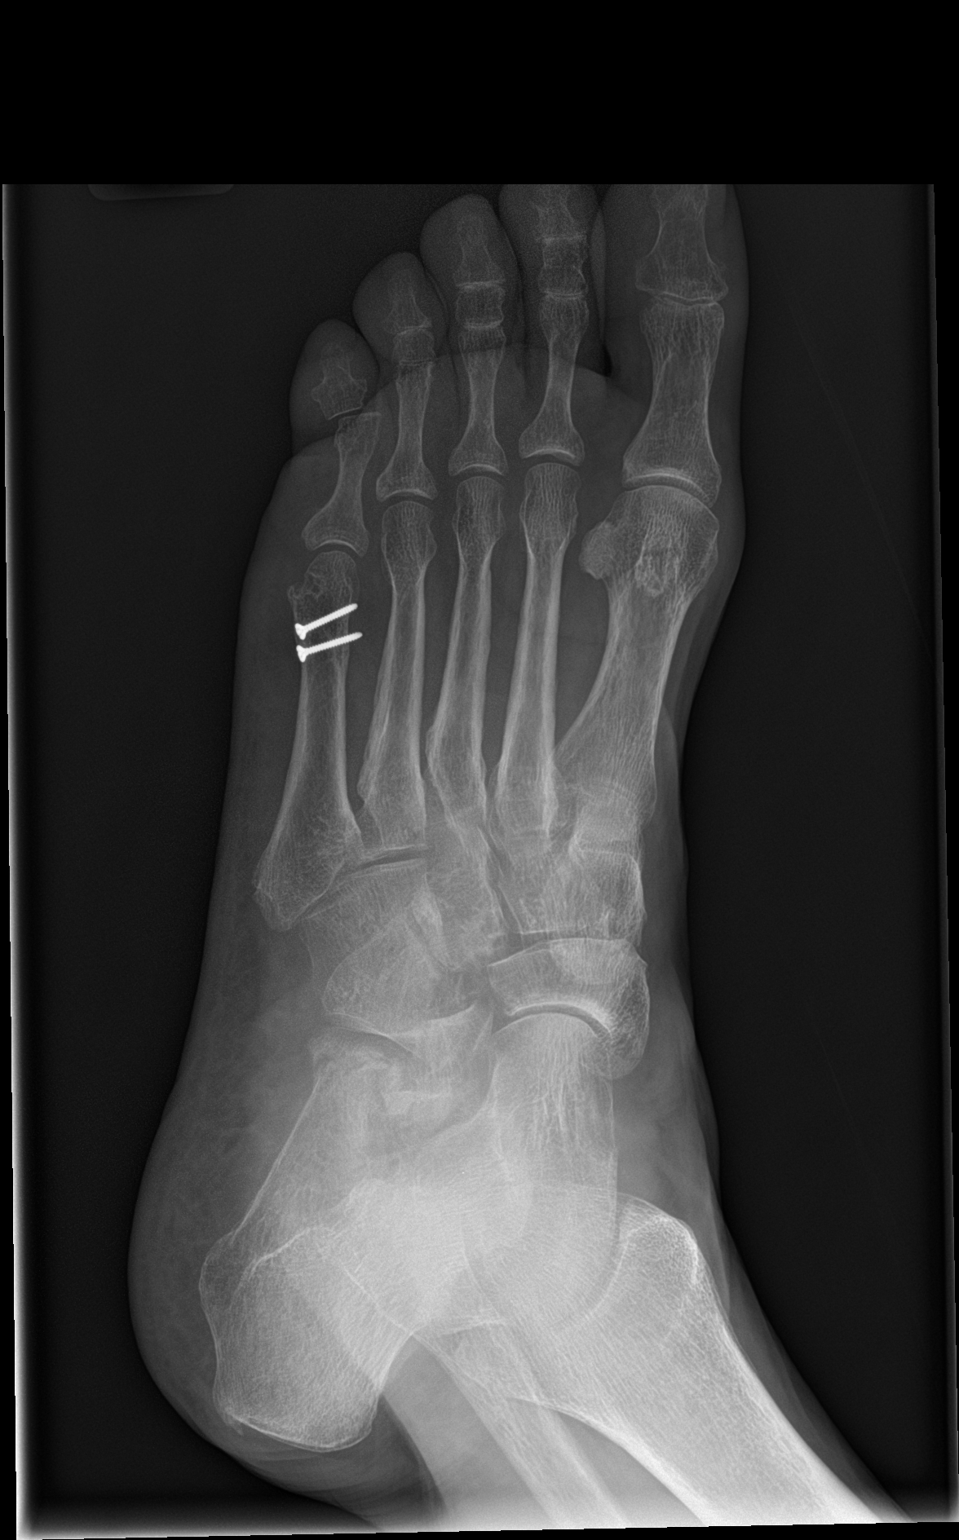

[foot lat]
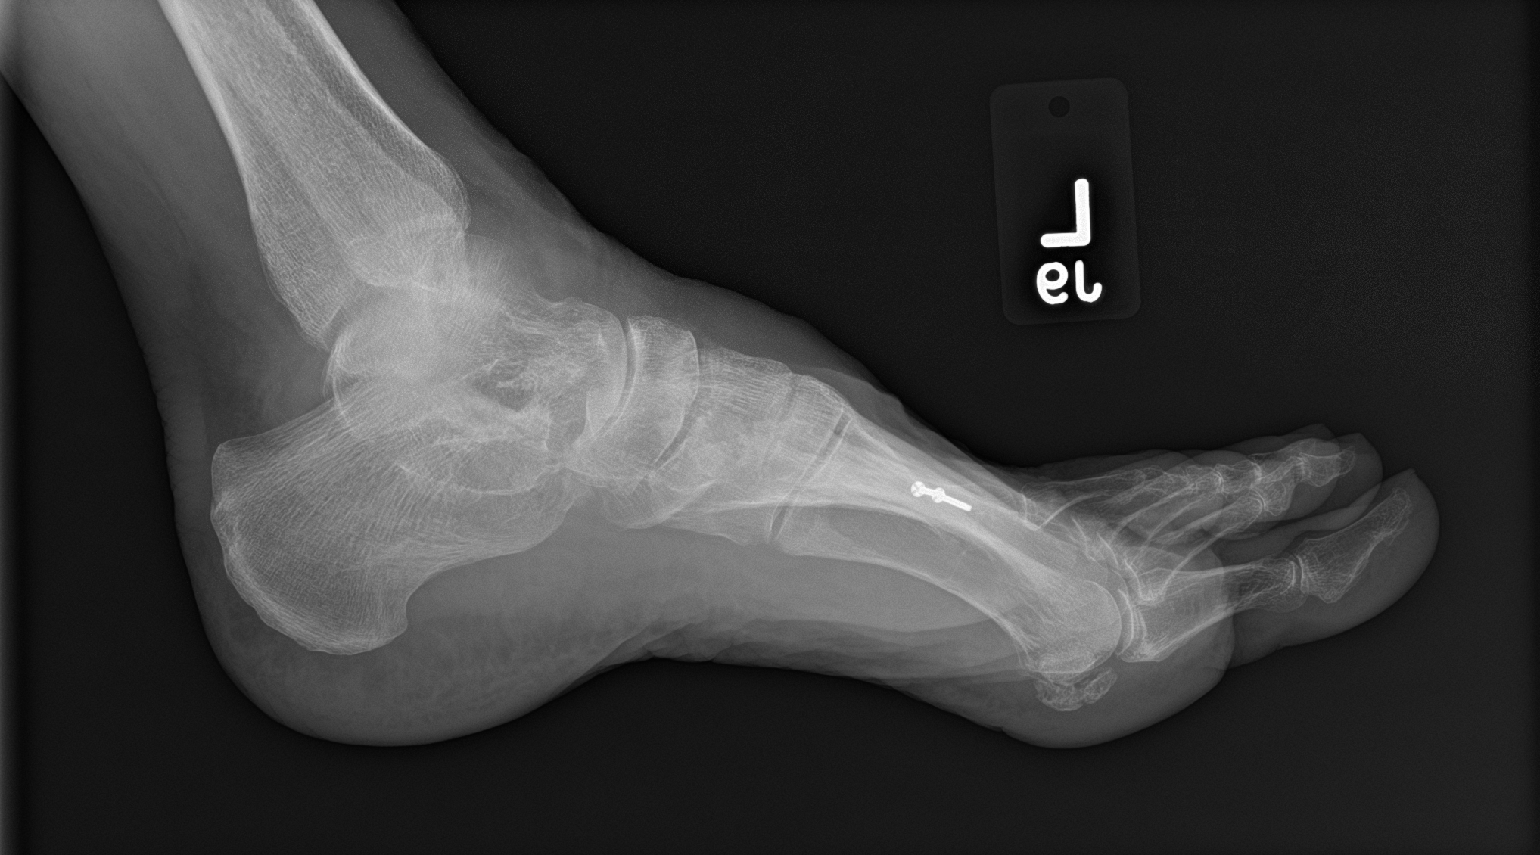

[3 of 3 positions shown; findings below may reference images not displayed]

FINDINGS: Two screws are noted within the distal fifth left metatarsal related
to remote injury. No acute fracture. No subluxation or dislocation.
Joint spaces are maintained.
IMPRESSION: No acute bony abnormality.

## 2016-10-13 ENCOUNTER — Encounter (HOSPITAL_COMMUNITY): Payer: Medicare Other

## 2016-11-14 DIAGNOSIS — D2262 Melanocytic nevi of left upper limb, including shoulder: Secondary | ICD-10-CM | POA: Diagnosis not present

## 2016-11-14 DIAGNOSIS — L910 Hypertrophic scar: Secondary | ICD-10-CM | POA: Diagnosis not present

## 2016-11-14 DIAGNOSIS — Z85828 Personal history of other malignant neoplasm of skin: Secondary | ICD-10-CM | POA: Diagnosis not present

## 2016-11-14 DIAGNOSIS — L821 Other seborrheic keratosis: Secondary | ICD-10-CM | POA: Diagnosis not present

## 2016-11-14 DIAGNOSIS — D225 Melanocytic nevi of trunk: Secondary | ICD-10-CM | POA: Diagnosis not present

## 2016-11-14 DIAGNOSIS — Z85820 Personal history of malignant melanoma of skin: Secondary | ICD-10-CM | POA: Diagnosis not present

## 2016-11-14 DIAGNOSIS — L570 Actinic keratosis: Secondary | ICD-10-CM | POA: Diagnosis not present

## 2016-11-20 ENCOUNTER — Ambulatory Visit: Payer: Medicare Other | Admitting: Interventional Cardiology

## 2016-12-06 DIAGNOSIS — H33312 Horseshoe tear of retina without detachment, left eye: Secondary | ICD-10-CM | POA: Diagnosis not present

## 2016-12-06 DIAGNOSIS — H35341 Macular cyst, hole, or pseudohole, right eye: Secondary | ICD-10-CM | POA: Diagnosis not present

## 2016-12-06 DIAGNOSIS — H43812 Vitreous degeneration, left eye: Secondary | ICD-10-CM | POA: Diagnosis not present

## 2016-12-06 DIAGNOSIS — Z961 Presence of intraocular lens: Secondary | ICD-10-CM | POA: Diagnosis not present

## 2017-03-05 DIAGNOSIS — M810 Age-related osteoporosis without current pathological fracture: Secondary | ICD-10-CM | POA: Diagnosis not present

## 2017-03-05 DIAGNOSIS — R202 Paresthesia of skin: Secondary | ICD-10-CM | POA: Diagnosis not present

## 2017-03-13 ENCOUNTER — Ambulatory Visit: Payer: Medicare Other

## 2017-04-03 ENCOUNTER — Ambulatory Visit: Payer: Medicare Other | Attending: Geriatric Medicine | Admitting: Physical Therapy

## 2017-04-03 ENCOUNTER — Encounter: Payer: Self-pay | Admitting: Physical Therapy

## 2017-04-03 DIAGNOSIS — M6281 Muscle weakness (generalized): Secondary | ICD-10-CM | POA: Diagnosis not present

## 2017-04-03 NOTE — Therapy (Signed)
[facility] [address], [place], [place], [address] Phone: [phone]   Fax:  [phone]  Physical Therapy Evaluation  Patient Details  Name: Julia [name] MRN: [id number] Date of Birth: [birth year] Referring Provider: Dr. Christiane Ha T. [place]  Encounter Date: 04/03/2017      PT End of Session - 04/03/17 1235    Visit Number 1   PT Start Time 1100   PT Stop Time 1145   PT Time Calculation (min) 45 min   Activity Tolerance Treatment limited secondary to medical complications (Comment)   Behavior During Therapy [facility] for tasks assessed/performed      Past Medical History:  Diagnosis Date  . Aortic insufficiency    Mild, echo, November, 2007 /mild, echo, December, 2009  . Blood pressure alteration    Historically pressure normal, April I. normal blood pressure in the office / recorded blood pressures from home normal  . Chest pain    Nuclear March, 2011, normal, ejection fraction 64%  . Dyslipidemia   . Ejection fraction    55-60%, echo, and December, 2009, septal dyssynergy  . GERD (gastroesophageal reflux disease)    Patient sometimes has slight right lower jaw discomfort at the time of her reflux  . LBBB (left bundle branch block)   . Pericardial effusion    Echo December, 2009, small, incidental  . Shoulder pain    Left scapular pain, appeared to be radicular    Past Surgical History:  Procedure Laterality Date  . [place] VITRECTOMY WITH 20 GAUGE MVR PORT FOR MACULAR HOLE  2000  . BREAST BIOPSY  1983   benign  . CHOLECYSTECTOMY  1984  . MELANOMA EXCISION  2012, 2013  . NASAL SEPTUM SURGERY  1993  . ROTATOR CUFF REPAIR  2002    There were no vitals filed for this visit.       Subjective Assessment - 04/03/17 1108    Subjective I have had a bone scan and indicates osteoporosis.  I was taking Fosomax but decided to not take it.  [facility] active  adult fitness 3 days/week.  The other 3 days has a Physiological scientist to be educated on the machines.  I am taking the Ultimate Bone Support to help with bone mass.    Patient Stated Goals education on osteoporosis   Currently in Pain? No/denies            [facility] PT Assessment - 04/03/17 0001      Assessment   Medical Diagnosis Osteoporosis   Referring Provider Dr. Christiane Ha T. [place]   Onset Date/Surgical Date 02/15/16   Prior Therapy none     Precautions   Precautions Other (comment)   Precaution Comments Osteoporosis     Restrictions   Weight Bearing Restrictions No     Balance Screen   Has the patient fallen in the past 6 months No   Has the patient had a decrease in activity level because of a fear of falling?  No   Is the patient reluctant to leave their home because of a fear of falling?  No     Home Ecologist residence     Prior Function   Level of Independence Independent   Vocation Retired     Associate Professor   Overall Cognitive Status Within Functional Limits for tasks assessed     ROM / Strength   AROM /  PROM / Strength Strength     Strength   Overall Strength Comments left hip abduction 4/5            Objective measurements completed on examination: See above findings.          [facility] Adult PT Treatment/Exercise - 04/03/17 0001      Therapeutic Activites    Therapeutic Activities ADL's;Lifting;Other Therapeutic Activities   ADL's correct posture with home activities   Lifting how to lift with legs and bend at hips   Other Therapeutic Activities how to exercise at the gym without straining the spine                PT Education - 04/03/17 1234    Education provided Yes   Education Details do and do not for osteoporosis, body mechanics, exercises, correct posture   Person(s) Educated Patient   Methods Explanation;Demonstration;Verbal cues;Handout   Comprehension Returned demonstration;Verbalized  understanding             PT Long Term Goals - 04/03/17 1235      PT LONG TERM GOAL #1   Title understand correct posture with exercise at the gym and daily activities   Time 1   Period Days   Status Achieved     PT LONG TERM GOAL #2   Title understand postures to avoid to decrease compression of the spine   Time 1   Period Days   Status Achieved                Plan - 04/03/17 1331    Clinical Impression Statement Patient is a 81 year old female with diagnosis of osteoporosis.  Patient reports no pain. Patient is exercising at the gym, does a class 3 times per week and involved with a walking program. Patient has no limitations in her posture.  Patient has weakness in hip abductors.  Patient was educated on how to perform her exercises at the gym with correct body mechanics and how to lift correctly.  Patient was instructed to not do crunches due to compression of the vertebrae. Patient benefit from skilled therapy for education on ways to manage osteoporosis through exercise, diet and correct posture.    History and Personal Factors relevant to plan of care: ostoeporosis   Clinical Presentation Stable   Clinical Presentation due to: stable condition   Clinical Decision Making Low   Rehab Potential Excellent   PT Frequency One time visit   PT Duration Other (comment)  1 visit   PT Treatment/Interventions Patient/family education;Neuromuscular re-education;Therapeutic exercise;Therapeutic activities   PT Next Visit Plan Discharge to HEP this visit   Recommended Other Services None   Consulted and Agree with Plan of Care Patient      Patient will benefit from skilled therapeutic intervention in order to improve the following deficits and impairments:  Decreased strength  Visit Diagnosis: Muscle weakness (generalized) - Plan: PT plan of care cert/re-cert      G-Codes - 53/29/92 1329    Functional Assessment Tool Used (Outpatient Only) 0% limitation   Functional  Limitation Other PT primary   Other PT Primary Current Status (E2683) 0 percent impaired, limited or restricted   Other PT Primary Goal Status (M1962) 0 percent impaired, limited or restricted   Other PT Primary Discharge Status (I2979) 0 percent impaired, limited or restricted       Problem List Patient Active Problem List   Diagnosis Date Noted  . Shortness of breath 07/23/2015  . Dyslipidemia   .  Blood pressure alteration   . GERD (gastroesophageal reflux disease)   . Ejection fraction   . Aortic insufficiency   . Shoulder pain   . Chest pain   . Pericardial effusion   . Left bundle branch block 01/03/2010    Earlie Counts, PT 04/03/17 1:40 PM   [facility] [address] [place], [place], [address] Phone: [phone]   Fax:  [phone]  Name: KORALINE PHILLIPSON MRN: [id number] Date of Birth: 03/04/1936

## 2017-04-03 NOTE — Patient Instructions (Addendum)
Hip adductor/abductor machine thigh machine  Arm / Leg Lift: Opposite (Prone)    Lift right leg and opposite arm __2__ inches from floor, keeping knee locked. Repeat __10__ times per set. Do _1___ sets per session. Do _1___ sessions per day.  http://orth.exer.us/100   Copyright  VHI. All rights reserved.  Push-Up, Wall    Inhaling, bend elbows and bring chest toward the wall. Exhaling, straighten arms. Keep shoulders down. Keep chin tucked in .  Repeat _10__ times. Do __1_ times per day.  Copyright  VHI. All rights reserved.                                             DO's and DON'T's   Avoid and/or Minimize positions of forward bending ( flexion)  Side bending and rotation of the trunk  Especially when movements occur together   When your back aches:   Don't sit down   Lie down on your back with a small pillow under your head and one under your knees or as outlined by our therapist. Or, lie in the 90/90 position ( on the floor with your feet and legs on the sofa with knees and hips bent to 90 degrees)  Tying or putting on your shoes:   Don't bend over to tie your shoes or put on socks.  Instead, bring one foot up, cross it over the opposite knee and bend forward (hinge) at the hips to so the task.  Keep your back straight.  If you cannot do this safely, then you need to use long handled assistive devices such as a shoehorn and sock puller.  Exercising:  Don't engage in ballistic types of exercise routines such as high-impact aerobics or jumping rope  Don't do exercises in the gym that bring you forward (abdominal crunches, sit-ups, touching your  toes, knee-to-chest, straight leg raising.)  Follow a regular exercise program that includes a variety of different weight-bearing activities, such as low-impact aerobics, T' ai chi or walking as your physical therapist advises  Do exercises that emphasize return to normal body alignment and strengthening of the muscles  that keep your back straight, as outlined in this program or by your therapist  No crunches of any kind  Household tasks:  Don't reach unnecessarily or twist your trunk when mopping, sweeping, vacuuming, raking, making beds, weeding gardens, getting objects ou of cupboards, etc.  Keep your broom, mop, vacuum, or rake close to you and mover your whole body as you move them. Walk over to the area on which you are working. Arrange kitchen, bathroom, and bedroom shelves so that frequently used items may be reached without excessive bending, twisting, and reaching.  Use a sturdy stool if necessary.  Don't bend from the waist to pick up something up  Off the floor, out of the trunk of your car, or to brush your teeth, wash your face, etc.   Bend at the knees, keeping back straight as possible. Use a reacher if necessary.   Prevention of fracture is the so-called "BOTTOm -Line" in the management of OSTEOPOROSIS. Do not take unnecessary chances in movement. Once a compression fracture occurs, the process is very difficult to control; one fracture is frequently followed by many more.   Posture - Sitting    Sit upright, head facing forward. Try using a roll to support lower back. Keep shoulders relaxed,  and avoid rounded back. Keep hips level with knees. Avoid crossing legs for long periods.   Copyright  VHI. All rights reserved.  Sleeping on Back    Place pillow under knees. A pillow with cervical support and a roll around waist are also helpful.   Copyright  VHI. All rights reserved.  Sleeping on Side    Place pillow between knees and feet. Use cervical support under neck and a roll around waist as needed.   Copyright  VHI. All rights reserved.    Keeping Chin Tucked    Keep chin tucked and shoulders back when picking up objects.   Copyright  VHI. All rights reserved.  Dressing    Lie on back to pull socks or slacks over feet, or sit and bend leg while keeping back  straight.   Copyright  VHI. All rights reserved.  Healthy Back Strengthening - Back Extension on All Fours    Start on hands and knees, keeping them apart. Straighten right leg and left arm at the same time. Hold __1__ seconds. Switch immediately and repeat with left leg and right arm. Do __10__ times. Increase repetitions gradually up to __1__. Increase each hold gradually up to _5___ seconds.  Copyright  VHI. All rights reserved.  Proper Breathing and Posture  It is important that you breathe normally while doing your exercises, and avoid holding your breath. Maintain good posture and proper head alignment when performing the exercises.   Copyright  VHI. All rights reserved.   Lifting Principles  .Maintain proper posture and head alignment. .Slide object as close as possible before lifting. .Move obstacles out of the way. .Test before lifting; ask for help if too heavy. .Tighten stomach muscles without holding breath. .Use smooth movements; do not jerk. .Use legs to do the work, and pivot with feet. .Distribute the work load symmetrically and close to the center of trunk. .Push instead of pull whenever possible.  Copyright  VHI. All rights reserved.  Deep Squat    Squat and lift with both arms held against upper trunk. Tighten stomach muscles without holding breath. Use smooth movements to avoid jerking.  Copyright  VHI. All rights reserved.  [facility] [address] [place], [place] [address] Phone # [phone] Fax [phone] [email or web address]

## 2017-05-15 DIAGNOSIS — D2271 Melanocytic nevi of right lower limb, including hip: Secondary | ICD-10-CM | POA: Diagnosis not present

## 2017-05-15 DIAGNOSIS — L570 Actinic keratosis: Secondary | ICD-10-CM | POA: Diagnosis not present

## 2017-05-15 DIAGNOSIS — L853 Xerosis cutis: Secondary | ICD-10-CM | POA: Diagnosis not present

## 2017-05-15 DIAGNOSIS — Z85820 Personal history of malignant melanoma of skin: Secondary | ICD-10-CM | POA: Diagnosis not present

## 2017-05-15 DIAGNOSIS — Z85828 Personal history of other malignant neoplasm of skin: Secondary | ICD-10-CM | POA: Diagnosis not present

## 2017-05-15 DIAGNOSIS — D2272 Melanocytic nevi of left lower limb, including hip: Secondary | ICD-10-CM | POA: Diagnosis not present

## 2017-05-15 DIAGNOSIS — D225 Melanocytic nevi of trunk: Secondary | ICD-10-CM | POA: Diagnosis not present

## 2017-05-15 DIAGNOSIS — L910 Hypertrophic scar: Secondary | ICD-10-CM | POA: Diagnosis not present

## 2017-05-15 DIAGNOSIS — D1801 Hemangioma of skin and subcutaneous tissue: Secondary | ICD-10-CM | POA: Diagnosis not present

## 2017-05-15 DIAGNOSIS — D2262 Melanocytic nevi of left upper limb, including shoulder: Secondary | ICD-10-CM | POA: Diagnosis not present

## 2017-05-15 DIAGNOSIS — L814 Other melanin hyperpigmentation: Secondary | ICD-10-CM | POA: Diagnosis not present

## 2017-05-15 DIAGNOSIS — L821 Other seborrheic keratosis: Secondary | ICD-10-CM | POA: Diagnosis not present

## 2017-06-19 ENCOUNTER — Other Ambulatory Visit: Payer: Self-pay | Admitting: Internal Medicine

## 2017-06-19 ENCOUNTER — Other Ambulatory Visit: Payer: Self-pay | Admitting: Geriatric Medicine

## 2017-06-19 DIAGNOSIS — Z1231 Encounter for screening mammogram for malignant neoplasm of breast: Secondary | ICD-10-CM

## 2017-06-22 DIAGNOSIS — R5383 Other fatigue: Secondary | ICD-10-CM | POA: Diagnosis not present

## 2017-06-22 DIAGNOSIS — Z0000 Encounter for general adult medical examination without abnormal findings: Secondary | ICD-10-CM | POA: Diagnosis not present

## 2017-06-22 DIAGNOSIS — Z79899 Other long term (current) drug therapy: Secondary | ICD-10-CM | POA: Diagnosis not present

## 2017-06-22 DIAGNOSIS — E7800 Pure hypercholesterolemia, unspecified: Secondary | ICD-10-CM | POA: Diagnosis not present

## 2017-06-22 DIAGNOSIS — M810 Age-related osteoporosis without current pathological fracture: Secondary | ICD-10-CM | POA: Diagnosis not present

## 2017-06-25 ENCOUNTER — Encounter: Payer: Self-pay | Admitting: Physician Assistant

## 2017-06-25 ENCOUNTER — Ambulatory Visit (INDEPENDENT_AMBULATORY_CARE_PROVIDER_SITE_OTHER): Payer: Medicare Other | Admitting: Physician Assistant

## 2017-06-25 ENCOUNTER — Telehealth: Payer: Self-pay | Admitting: Interventional Cardiology

## 2017-06-25 VITALS — BP 136/80 | HR 69 | Ht 65.0 in | Wt 129.0 lb

## 2017-06-25 DIAGNOSIS — R251 Tremor, unspecified: Secondary | ICD-10-CM

## 2017-06-25 DIAGNOSIS — R002 Palpitations: Secondary | ICD-10-CM

## 2017-06-25 DIAGNOSIS — R0602 Shortness of breath: Secondary | ICD-10-CM

## 2017-06-25 DIAGNOSIS — I351 Nonrheumatic aortic (valve) insufficiency: Secondary | ICD-10-CM | POA: Diagnosis not present

## 2017-06-25 DIAGNOSIS — I447 Left bundle-branch block, unspecified: Secondary | ICD-10-CM | POA: Diagnosis not present

## 2017-06-25 NOTE — Progress Notes (Addendum)
Cardiology Office Note    Date:  06/25/2017  ID:  Julia [name], DOB Oct 17, 1936, MRN [id number] PCP:  Lajean Manes, MD  Cardiologist:  Dr. Irish Lack   Chief Complaint: "shakiness"  History of Present Illness:  Julia [name] is a 81 y.o. female with history of LBBB, mild AI, dyslipidemia, small pericardial effusion 2009 (incidentally noted), intermittently elevated blood pressure who presents for evaluation of feeling "shaky." Nuclear stress test 2011 was normal. 2D echo 10/2014: mild LVH, EF 50-55%, grade, mild AI, mildly RAE. She has been doing very well overall until recently. She was exercising 6 days a week for up to an hour at a time without any SOB or CP. She last did so 5 days ago without any functional limitation. On Friday she noticed a sensation of shakiness/weakness that lasted several hours, associated with a sensation of heart racing. This resolved spontaneously. Onset was gradual. She saw her PCP who ordered labwork showing Na 139, K 4.2, Cl 107, CO2 28, BUN 12, Cr 0.78, glucose 102, WBC 7.5, Hgb 15.4, Hct 43.6, Plt 324. She felt fine Saturday and Sunday. This morning she had another bout of feeling anxious and shaky, and noticed her BP was up in the 130s/80s. She reports hx of white coat HTN and brings in a log of BPs, most of which are in the 16-010 systolic range except for 110-130 range these past few days. She reports recently drinking [employer] 2x/week and being under increased stress from her husband ending up in the emergency room recently. She denies any CP whatsoever. She states she just feels "shaky." She was feeling palpitations in the OV today but auscultation revealed NSR. She denies overt overt but states she sometimes feels like she has to take a deep breath. No recent travel, surgery, bedrest. No LEE, orthopnea, PND, erythema. Initial BP was 136/80 with recheck BP 152/90. She walked up from the lobby into the building without any functional limitation.  Past Medical  History:  Diagnosis Date  . Aortic insufficiency    a. mild by echo 2016.  Marland Kitchen Chest pain    Nuclear March, 2011, normal, ejection fraction 64%  . Dyslipidemia   . Elevated blood pressure reading   . GERD (gastroesophageal reflux disease)    Patient sometimes has slight right lower jaw discomfort at the time of her reflux  . LBBB (left bundle branch block)   . Pericardial effusion    Echo December, 2009, small, incidental  . Shoulder pain    Left scapular pain, appeared to be radicular    Past Surgical History:  Procedure Laterality Date  . [place] VITRECTOMY WITH 20 GAUGE MVR PORT FOR MACULAR HOLE  2000  . BREAST BIOPSY  1983   benign  . CHOLECYSTECTOMY  1984  . MELANOMA EXCISION  2012, 2013  . NASAL SEPTUM SURGERY  1993  . ROTATOR CUFF REPAIR  2002    Current Medications: Current Meds  Medication Sig  . ALPRAZolam (XANAX) 0.5 MG tablet Take 0.5 mg by mouth as needed for anxiety.   Marland Kitchen aspirin 81 MG EC tablet Take 81 mg by mouth daily.    . Cholecalciferol (VITAMIN D3) 2000 UNITS capsule Take 2,000 Units by mouth daily.  . Melatonin 1 MG/ML LIQD Take 3 mLs by mouth at bedtime.   . Multiple Vitamin (MULTIVITAMIN) tablet Take 1 tablet by mouth daily.   Marland Kitchen OLIVE LEAF EXTRACT PO Take 1 tablet by mouth 2 (two) times daily. 900mg  each  .  Omega-3 Fatty Acids (FISH OIL CONCENTRATE PO) Take 1,600 mg by mouth daily.   Marland Kitchen OVER THE COUNTER MEDICATION Take 1 capsule by mouth daily. ULTIMATE BONE SUPPORT     Allergies:   Pravastatin   Social History   Social History  . Marital status: Married    Spouse name: N/A  . Number of children: N/A  . Years of education: N/A   Occupational History  . Retired    Social History Main Topics  . Smoking status: Never Smoker  . Smokeless tobacco: Never Used  . Alcohol use Yes  . Drug use: No  . Sexual activity: Not Asked   Other Topics Concern  . None   Social History Narrative  . None     Family History:  Family History   Problem Relation Age of Onset  . Emphysema Father   . Pneumonia Father   . COPD Father   . Heart murmur Mother   . Pneumonia Mother   . Heart attack Neg Hx   . Hypertension Neg Hx   . Stroke Neg Hx      ROS:    Please see the history of present illness.  All other systems are reviewed and otherwise negative.    PHYSICAL EXAM:   VS:  BP 136/80   Pulse 69   Ht 5\' 5"  (1.651 m)   Wt 129 lb (58.5 kg)   SpO2 97%   BMI 21.47 kg/m   BMI: Body mass index is 21.47 kg/m. GEN: Well nourished, well developed WF, in no acute distress  HEENT: normocephalic, atraumatic Neck: no JVD, carotid bruits, or masses Cardiac: RRR; 1/6 very soft diastolic murmur, no rubs or gallops, no edema  Respiratory:  clear to auscultation bilaterally, normal work of breathing GI: soft, nontender, nondistended, + BS MS: no deformity or atrophy  Skin: warm and dry, no rash Neuro:  Alert and Oriented x 3, Strength and sensation are intact, follows commands Psych: euthymic mood, full affect  Wt Readings from Last 3 Encounters:  06/25/17 129 lb (58.5 kg)  09/15/16 132 lb 6.4 oz (60.1 kg)  07/25/16 126 lb (57.2 kg)      Studies/Labs Reviewed:   EKG:  EKG was ordered today and personally reviewed by me and demonstrates NSR 69bpm, known LBBB (NSIVCD), TWI I avL  Additional studies/ records that were reviewed today include: Summarized above.    ASSESSMENT & PLAN:   1. Shakiness/palpitations - etiology not clear. BP is up in clinic today. Orthostatics were negative. HR is near baseline and EKG generally unchanged. She did report mild residual palpitations while in the OV today but was in NSR by auscultation. However, she has described her heart as "fluttering" during some of these feelings of anxiety. She's not had any CP or significant dyspnea. She does describe a rare sensation like she has to sigh. Will check thyroid function and BNP. Plan 30-day event monitor to exclude contributory arrhythmia given age  and atypical symptoms. I wonder whether she could be symptomatic from her mildly elevated BP. She does report a history of labile fluctuations with white coat HTN. Given historically low BP at home as above, would be hesitant to initiate agent just yet. I have recommended for her to cut out [employer] and to follow BP for now, obtain above information, and reassess in follow-up. I advised her to continue to monitor BP at home and call if running >161 systolic regularly at which time we may need to consider a PRN agent.  ER precautions reviewed. 2. Elevated BP without formal dx of chronic HTN - as above, monitor closely for now. Check thyroid function. 3. LBBB - this is chronic for patient. No specific symptoms of bradycardia but will follow by monitor. 4. Aortic insufficiency - given recent symptoms, will reassess by echocardiogram.  Disposition: F/u with Dr. Roney Marion team APP after above testing.   Medication Adjustments/Labs and Tests Ordered: Current medicines are reviewed at length with the patient today.  Concerns regarding medicines are outlined above. Medication changes, Labs and Tests ordered today are summarized above and listed in the Patient Instructions accessible in Encounters.   Signed, Charlie Pitter, PA-C  06/25/2017 2:18 PM    [facility] [address], [place], [place]  [address] Phone: [phone]; Fax: [phone]

## 2017-06-25 NOTE — Telephone Encounter (Signed)
New message      Pt c/o being "shakey" and " weak" and a little lightheaded.  Please triage

## 2017-06-25 NOTE — Patient Instructions (Addendum)
Medication Instructions:  Your physician recommends that you continue on your current medications as directed. Please refer to the Current Medication list given to you today.   Labwork: TODAY:  TSH, FREE T4, AND PRO BNP  Testing/Procedures: .Your physician has requested that you have an echocardiogram. Echocardiography is a painless test that uses sound waves to create images of your heart. It provides your doctor with information about the size and shape of your heart and how well your heart's chambers and valves are working. This procedure takes approximately one hour. There are no restrictions for this procedure.  Your physician has recommended that you wear an event monitor. Event monitors are medical devices that record the heart's electrical activity. Doctors most often [place] these monitors to diagnose arrhythmias. Arrhythmias are problems with the speed or rhythm of the heartbeat. The monitor is a small, portable device. You can wear one while you do your normal daily activities. This is usually used to diagnose what is causing palpitations/syncope (passing out).    Follow-Up: Your physician recommends that you schedule a follow-up appointment in: AFTER TESTING IS COMPLETE WITH DR. VARANASI OR HIS CARETEAM   Any Other Special Instructions Will Be Listed Below (If Applicable).  Echocardiogram An echocardiogram, or echocardiography, uses sound waves (ultrasound) to produce an image of your heart. The echocardiogram is simple, painless, obtained within a short period of time, and offers valuable information to your health care provider. The images from an echocardiogram can provide information such as:  Evidence of coronary artery disease (CAD).  Heart size.  Heart muscle function.  Heart valve function.  Aneurysm detection.  Evidence of a past heart attack.  Fluid buildup around the heart.  Heart muscle thickening.  Assess heart valve function.  Tell a health care provider  about:  Any allergies you have.  All medicines you are taking, including vitamins, herbs, eye drops, creams, and over-the-counter medicines.  Any problems you or family members have had with anesthetic medicines.  Any blood disorders you have.  Any surgeries you have had.  Any medical conditions you have.  Whether you are pregnant or may be pregnant. What happens before the procedure? No special preparation is needed. Eat and drink normally. What happens during the procedure?  In order to produce an image of your heart, gel will be applied to your chest and a wand-like tool (transducer) will be moved over your chest. The gel will help transmit the sound waves from the transducer. The sound waves will harmlessly bounce off your heart to allow the heart images to be captured in real-time motion. These images will then be recorded.  You may need an IV to receive a medicine that improves the quality of the pictures. What happens after the procedure? You may return to your normal schedule including diet, activities, and medicines, unless your health care provider tells you otherwise. This information is not intended to replace advice given to you by your health care provider. Make sure you discuss any questions you have with your health care provider. Document Released: 10/13/2000 Document Revised: 06/03/2016 Document Reviewed: 06/23/2013 Elsevier Interactive Patient Education  2017 [employer].    Cardiac Event Monitoring A cardiac event monitor is a small recording device that is used to detect abnormal heart rhythms (arrhythmias). The monitor is used to record your heart rhythm when you have symptoms, such as:  Fast heartbeats (palpitations), such as heart racing or fluttering.  Dizziness.  Fainting or light-headedness.  Unexplained weakness.  Some monitors are  wired to electrodes placed on your chest. Electrodes are flat, sticky disks that attach to your skin. Other monitors  may be hand-held or worn on the wrist. The monitor can be worn for up to 30 days. If the monitor is attached to your chest, a technician will prepare your chest for the electrode placement and show you how to work the monitor. Take time to practice using the monitor before you leave the office. Make sure you understand how to send the information from the monitor to your health care provider. In some cases, you may need to use a landline telephone instead of a cell phone. What are the risks? Generally, this device is safe to use, but it possible that the skin under the electrodes will become irritated. How to use your cardiac event monitor  Wear your monitor at all times, except when you are in water: ? Do not let the monitor get wet. ? Take the monitor off when you bathe. Do not swim or use a hot tub with it on.  Keep your skin clean. Do not put body lotion or moisturizer on your chest.  Change the electrodes as told by your health care provider or any time they stop sticking to your skin. You may need to use medical tape to keep them on.  Try to put the electrodes in slightly different places on your chest to help prevent skin irritation. They must remain in the area under your left breast and in the upper right section of your chest.  Make sure the monitor is safely clipped to your clothing or in a location close to your body that your health care provider recommends.  Press the button to record as soon as you feel heart-related symptoms, such as: ? Dizziness. ? Weakness. ? Light-headedness. ? Palpitations. ? Thumping or pounding in your chest. ? Shortness of breath. ? Unexplained weakness.  Keep a diary of your activities, such as walking, doing chores, and taking medicine. It is very important to note what you were doing when you pushed the button to record your symptoms. This will help your health care provider determine what might be contributing to your symptoms.  Send the  recorded information as recommended by your health care provider. It may take some time for your health care provider to process the results.  Change the batteries as told by your health care provider.  Keep electronic devices away from your monitor. This includes: ? Tablets. ? MP3 players. ? Cell phones.  While wearing your monitor you should avoid: ? Electric blankets. ? Armed forces operational officer. ? Electric toothbrushes. ? Microwave ovens. ? Magnets. ? Metal detectors. Get help right away if:  You have chest pain.  You have extreme difficulty breathing or shortness of breath.  You develop a very fast heartbeat that persists.  You develop dizziness that does not go away.  You faint or constantly feel like you are about to faint. Summary  A cardiac event monitor is a small recording device that is used to help detect abnormal heart rhythms (arrhythmias).  The monitor is used to record your heart rhythm when you have heart-related symptoms.  Make sure you understand how to send the information from the monitor to your health care provider.  It is important to press the button on the monitor when you have any heart-related symptoms.  Keep a diary of your activities, such as walking, doing chores, and taking medicine. It is very important to note what you were doing  when you pushed the button to record your symptoms. This will help your health care provider learn what might be causing your symptoms. This information is not intended to replace advice given to you by your health care provider. Make sure you discuss any questions you have with your health care provider. Document Released: 07/25/2008 Document Revised: 09/30/2016 Document Reviewed: 09/30/2016 Elsevier Interactive Patient Education  2017 [employer].   If you need a refill on your cardiac medications before your next appointment, please call your pharmacy.

## 2017-06-25 NOTE — Telephone Encounter (Signed)
Patient calling complaining of feeling weak, lightheaded, and weak for the past couple of days. She states that she becomes SOB with activity and has felt very weak. She states that she feels more lightheaded with position changes. Patient states that her HR is 71 and her last BP was 132/74. Patient is requesting appointment today. Appointment scheduled with Melina Copa, PA today at 2:00 PM.

## 2017-06-26 ENCOUNTER — Encounter: Payer: Self-pay | Admitting: Physician Assistant

## 2017-06-26 ENCOUNTER — Telehealth: Payer: Self-pay | Admitting: *Deleted

## 2017-06-26 DIAGNOSIS — R002 Palpitations: Secondary | ICD-10-CM

## 2017-06-26 LAB — PRO B NATRIURETIC PEPTIDE: NT-PRO BNP: 100 pg/mL (ref 0–738)

## 2017-06-26 LAB — T4, FREE: FREE T4: 1.28 ng/dL (ref 0.82–1.77)

## 2017-06-26 LAB — TSH: TSH: 1.51 u[IU]/mL (ref 0.450–4.500)

## 2017-06-26 NOTE — Telephone Encounter (Signed)
Received mychart message from pt wanting to switch her event monitor to a 48 hour monitor and she was also inquiring about her returning to her low impact cardio exercising, with her breathing the way it is. Pt was advised that Lisbeth Renshaw is ok with changing her 30 day monitor to a 48 hour monitor (change is done in [facility]) Pt advised that she wasn't sob, but had to like "sigh" at times, that she felt that she had to breathe.  Advised pt, per Melina Copa, PA-C that it is ok for her to return to the low impact exercising, but she needed to listen to her body and she has increased symptoms, then she needs to decrease her activity. Pt agreeable to this and she thanked me for the call.

## 2017-07-03 DIAGNOSIS — K2900 Acute gastritis without bleeding: Secondary | ICD-10-CM | POA: Diagnosis not present

## 2017-07-11 ENCOUNTER — Other Ambulatory Visit (HOSPITAL_COMMUNITY): Payer: Medicare Other

## 2017-07-11 ENCOUNTER — Ambulatory Visit (INDEPENDENT_AMBULATORY_CARE_PROVIDER_SITE_OTHER): Payer: Medicare Other

## 2017-07-11 ENCOUNTER — Encounter: Payer: Self-pay | Admitting: Physician Assistant

## 2017-07-11 ENCOUNTER — Other Ambulatory Visit: Payer: Self-pay

## 2017-07-11 ENCOUNTER — Ambulatory Visit (HOSPITAL_COMMUNITY): Payer: Medicare Other | Attending: Cardiovascular Disease

## 2017-07-11 DIAGNOSIS — I447 Left bundle-branch block, unspecified: Secondary | ICD-10-CM | POA: Insufficient documentation

## 2017-07-11 DIAGNOSIS — I080 Rheumatic disorders of both mitral and aortic valves: Secondary | ICD-10-CM | POA: Insufficient documentation

## 2017-07-11 DIAGNOSIS — R002 Palpitations: Secondary | ICD-10-CM

## 2017-07-11 DIAGNOSIS — E785 Hyperlipidemia, unspecified: Secondary | ICD-10-CM | POA: Insufficient documentation

## 2017-07-11 LAB — ECHOCARDIOGRAM COMPLETE
AVPHT: 1248 ms
CHL CUP MV DEC (S): 317
E decel time: 317 msec
E/e' ratio: 8.77
FS: 24 % — AB (ref 28–44)
IVS/LV PW RATIO, ED: 1.68
LA diam end sys: 23 mm
LA diam index: 1.4 cm/m2
LA vol A4C: 22.2 ml
LASIZE: 23 mm
LAVOL: 34.5 mL
LAVOLIN: 21 mL/m2
LDCA: 2.27 cm2
LV PW d: 10.5 mm — AB (ref 0.6–1.1)
LV TDI E'MEDIAL: 4.24
LV e' LATERAL: 6.64 cm/s
LVEEAVG: 8.77
LVEEMED: 8.77
LVOT diameter: 17 mm
Lateral S' vel: 12.9 cm/s
MV pk A vel: 95.6 m/s
MVPKEVEL: 58.2 m/s
TDI e' lateral: 6.64

## 2017-07-12 ENCOUNTER — Encounter: Payer: Self-pay | Admitting: Physician Assistant

## 2017-07-17 ENCOUNTER — Telehealth: Payer: Self-pay | Admitting: Physician Assistant

## 2017-07-17 ENCOUNTER — Ambulatory Visit: Payer: Medicare Other | Admitting: Cardiology

## 2017-07-17 NOTE — Telephone Encounter (Signed)
New message   [facility] calling because they need the ORDER for the holter monitor for this patient. They received the data but not the order. Requests the order faxed or a call back.

## 2017-07-17 NOTE — Telephone Encounter (Signed)
Spoke with Verdis Frederickson at [facility] and faxed the requested information.

## 2017-07-31 DIAGNOSIS — H35341 Macular cyst, hole, or pseudohole, right eye: Secondary | ICD-10-CM | POA: Diagnosis not present

## 2017-07-31 DIAGNOSIS — H33312 Horseshoe tear of retina without detachment, left eye: Secondary | ICD-10-CM | POA: Diagnosis not present

## 2017-07-31 DIAGNOSIS — H43812 Vitreous degeneration, left eye: Secondary | ICD-10-CM | POA: Diagnosis not present

## 2017-07-31 DIAGNOSIS — Z961 Presence of intraocular lens: Secondary | ICD-10-CM | POA: Diagnosis not present

## 2017-08-07 ENCOUNTER — Ambulatory Visit
Admission: RE | Admit: 2017-08-07 | Discharge: 2017-08-07 | Disposition: A | Discharge status: Home / Self Care | Payer: Medicare Other | Source: Ambulatory Visit | Attending: Geriatric Medicine | Admitting: Geriatric Medicine

## 2017-08-07 DIAGNOSIS — Z1231 Encounter for screening mammogram for malignant neoplasm of breast: Secondary | ICD-10-CM

## 2017-08-21 DIAGNOSIS — E7800 Pure hypercholesterolemia, unspecified: Secondary | ICD-10-CM | POA: Diagnosis not present

## 2017-08-21 DIAGNOSIS — F419 Anxiety disorder, unspecified: Secondary | ICD-10-CM | POA: Diagnosis not present

## 2017-08-21 DIAGNOSIS — Z1389 Encounter for screening for other disorder: Secondary | ICD-10-CM | POA: Diagnosis not present

## 2017-08-21 DIAGNOSIS — Z23 Encounter for immunization: Secondary | ICD-10-CM | POA: Diagnosis not present

## 2017-08-21 DIAGNOSIS — Z0000 Encounter for general adult medical examination without abnormal findings: Secondary | ICD-10-CM | POA: Diagnosis not present

## 2017-09-01 DIAGNOSIS — Z23 Encounter for immunization: Secondary | ICD-10-CM | POA: Diagnosis not present

## 2017-09-07 ENCOUNTER — Encounter: Payer: Self-pay | Admitting: Interventional Cardiology

## 2017-09-15 NOTE — Progress Notes (Signed)
Cardiology Office Note   Date:  09/18/2017   ID:  Julia [name], DOB 01-26-1936, MRN [id number]  PCP:  Lajean Manes, MD    No chief complaint on file.  AI  Wt Readings from Last 3 Encounters:  09/18/17 128 lb 3.2 oz (58.2 kg)  06/25/17 129 lb (58.5 kg)  09/15/16 132 lb 6.4 oz (60.1 kg)       History of Present Illness: Julia [name] is a 81 y.o. female  Who has had a LBBB and mild aortic insufficiency.  She had a normal stress test in 2011.  Echo in 2016 showed normal LV function with mild AI.   She was limited by torn foot ligaments.  She was given a prescription for losartan/HCTZ 50/12.5 mg but did not want to take it.  BP in our office with her cuff correlated to our reading in the office n the past.  BP readings at home have been in the 096-283 systolic range, over the last few months.   Exercises 6 days /week.  Some cardio and weights.  She uses equipment at [employer], also.  She has a Clinical research associate.   Denies : Chest pain. Dizziness. Leg edema. Nitroglycerin use. Orthopnea. Palpitations. Paroxysmal nocturnal dyspnea. Shortness of breath. Syncope.     Past Medical History:  Diagnosis Date  . Aortic insufficiency    a. mild by echo 2016.  Marland Kitchen Chest pain    Nuclear March, 2011, normal, ejection fraction 64%  . Dyslipidemia   . Elevated blood pressure reading   . GERD (gastroesophageal reflux disease)    Patient sometimes has slight right lower jaw discomfort at the time of her reflux  . LBBB (left bundle branch block)   . Pericardial effusion    Echo December, 2009, small, incidental  . Shoulder pain    Left scapular pain, appeared to be radicular    Past Surgical History:  Procedure Laterality Date  . [place] VITRECTOMY WITH 20 GAUGE MVR PORT FOR MACULAR HOLE  2000  . BREAST BIOPSY  1983   benign  . CHOLECYSTECTOMY  1984  . MELANOMA EXCISION  2012, 2013  . NASAL SEPTUM SURGERY  1993  . ROTATOR CUFF REPAIR  2002     Current Outpatient  Medications  Medication Sig Dispense Refill  . ALPRAZolam (XANAX) 0.5 MG tablet Take 0.5 mg by mouth as needed for anxiety.     Marland Kitchen aspirin 81 MG EC tablet Take 81 mg by mouth daily.      . Cholecalciferol (VITAMIN D3) 2000 UNITS capsule Take 2,000 Units by mouth daily.    . Melatonin 1 MG/ML LIQD Take 3 mLs by mouth at bedtime.     . Multiple Vitamin (MULTIVITAMIN) tablet Take 1 tablet by mouth daily.     Marland Kitchen OLIVE LEAF EXTRACT PO Take 1 tablet by mouth 2 (two) times daily. 900mg  each    . Omega-3 Fatty Acids (FISH OIL CONCENTRATE PO) Take 1,600 mg by mouth daily.     Marland Kitchen omeprazole (PRILOSEC) 20 MG capsule Take 20 mg by mouth as needed.  0  . OVER THE COUNTER MEDICATION Take 1 capsule by mouth daily. ULTIMATE BONE SUPPORT    . Propylene Glycol 0.6 % SOLN Place 1 drop into both eyes as needed.     No current facility-administered medications for this visit.     Allergies:   Pravastatin    Social History:  The patient  reports that  has never smoked.  she has never used smokeless tobacco. She reports that she drinks alcohol. She reports that she does not use drugs.   Family History:  The patient's family history includes COPD in her father; Emphysema in her father; Heart murmur in her mother; Pneumonia in her father and mother.    ROS:  Please see the history of present illness.   Otherwise, review of systems are positive for BP normal at home, concerned about bleeding risk from aspirin and turmeric.   All other systems are reviewed and negative.    PHYSICAL EXAM: VS:  BP 106/78   Pulse 79   Ht 5\' 5"  (1.651 m)   Wt 128 lb 3.2 oz (58.2 kg)   SpO2 98%   BMI 21.33 kg/m  , BMI Body mass index is 21.33 kg/m. GEN: Well nourished, well developed, in no acute distress  HEENT: normal  Neck: no JVD, carotid bruits, or masses Cardiac: RRR; early systolic murmur, no rubs, or gallops,no edema  Respiratory:  clear to auscultation bilaterally, normal work of breathing GI: soft, nontender,  nondistended, + BS MS: no deformity or atrophy  Skin: warm and dry, no rash Neuro:  Strength and sensation are intact Psych: euthymic mood, full affect   EKG:   The ekg ordered in 8/18 demonstrates NSR, LBBB   Recent Labs: 06/25/2017: NT-Pro BNP 100; TSH 1.510   Lipid Panel No results found for: CHOL, TRIG, HDL, CHOLHDL, VLDL, LDLCALC, LDLDIRECT   Other studies Reviewed: Additional studies/ records that were reviewed today with results demonstrating: labs.   ASSESSMENT AND PLAN:  1. AI: mild in the past with normal LV function.  We discussed her echo results in detail.  She would like to decrease aspirin from daily to a few times a week and take turnmeric. 2. LBBB: Septal dyssynergy.  Chronic. 3. Elevated BP: Home readings are controlled.  4. Lipids follwed by PMD. LDL 112 5. Discussed finding of severe LVH.  She does not want to take an ACE-I which could help reverse the LVH.     Current medicines are reviewed at length with the patient today.  The patient concerns regarding her medicines were addressed.  The following changes have been made:  No change  Labs/ tests ordered today include:  No orders of the defined types were placed in this encounter.   Recommend 150 minutes/week of aerobic exercise Low fat, low carb, high fiber diet recommended  Disposition:   FU in 1 year   Signed, Larae Grooms, MD  09/18/2017 12:58 PM    [facility] [place], [place], [place]  [address] Phone: [phone]; Fax: [phone]

## 2017-09-18 ENCOUNTER — Encounter: Payer: Self-pay | Admitting: Interventional Cardiology

## 2017-09-18 ENCOUNTER — Ambulatory Visit (INDEPENDENT_AMBULATORY_CARE_PROVIDER_SITE_OTHER): Payer: Medicare Other | Admitting: Interventional Cardiology

## 2017-09-18 VITALS — BP 106/78 | HR 79 | Ht 65.0 in | Wt 128.2 lb

## 2017-09-18 DIAGNOSIS — I447 Left bundle-branch block, unspecified: Secondary | ICD-10-CM

## 2017-09-18 DIAGNOSIS — E785 Hyperlipidemia, unspecified: Secondary | ICD-10-CM

## 2017-09-18 DIAGNOSIS — R931 Abnormal findings on diagnostic imaging of heart and coronary circulation: Secondary | ICD-10-CM | POA: Diagnosis not present

## 2017-09-18 DIAGNOSIS — I351 Nonrheumatic aortic (valve) insufficiency: Secondary | ICD-10-CM | POA: Diagnosis not present

## 2017-09-18 NOTE — Patient Instructions (Signed)

## 2017-09-19 DIAGNOSIS — Z124 Encounter for screening for malignant neoplasm of cervix: Secondary | ICD-10-CM | POA: Diagnosis not present

## 2017-09-19 DIAGNOSIS — Z780 Asymptomatic menopausal state: Secondary | ICD-10-CM | POA: Diagnosis not present

## 2017-09-19 DIAGNOSIS — Z1151 Encounter for screening for human papillomavirus (HPV): Secondary | ICD-10-CM | POA: Diagnosis not present

## 2017-10-07 ENCOUNTER — Encounter: Payer: Self-pay | Admitting: Interventional Cardiology

## 2017-10-10 ENCOUNTER — Encounter: Payer: Self-pay | Admitting: Interventional Cardiology

## 2017-11-08 ENCOUNTER — Encounter: Payer: Self-pay | Admitting: Interventional Cardiology

## 2017-11-20 DIAGNOSIS — L720 Epidermal cyst: Secondary | ICD-10-CM | POA: Diagnosis not present

## 2017-11-20 DIAGNOSIS — Z85828 Personal history of other malignant neoplasm of skin: Secondary | ICD-10-CM | POA: Diagnosis not present

## 2017-11-20 DIAGNOSIS — D22121 Melanocytic nevi of left upper eyelid, including canthus: Secondary | ICD-10-CM | POA: Diagnosis not present

## 2017-11-20 DIAGNOSIS — L821 Other seborrheic keratosis: Secondary | ICD-10-CM | POA: Diagnosis not present

## 2017-11-20 DIAGNOSIS — Z85820 Personal history of malignant melanoma of skin: Secondary | ICD-10-CM | POA: Diagnosis not present

## 2017-11-20 DIAGNOSIS — D225 Melanocytic nevi of trunk: Secondary | ICD-10-CM | POA: Diagnosis not present

## 2017-11-20 DIAGNOSIS — L853 Xerosis cutis: Secondary | ICD-10-CM | POA: Diagnosis not present

## 2017-11-20 DIAGNOSIS — D2271 Melanocytic nevi of right lower limb, including hip: Secondary | ICD-10-CM | POA: Diagnosis not present

## 2017-11-20 DIAGNOSIS — D2262 Melanocytic nevi of left upper limb, including shoulder: Secondary | ICD-10-CM | POA: Diagnosis not present

## 2017-12-15 ENCOUNTER — Encounter: Payer: Self-pay | Admitting: Interventional Cardiology

## 2018-02-03 ENCOUNTER — Encounter: Payer: Self-pay | Admitting: Interventional Cardiology

## 2018-02-13 ENCOUNTER — Other Ambulatory Visit: Payer: Self-pay | Admitting: Geriatric Medicine

## 2018-02-13 DIAGNOSIS — R1013 Epigastric pain: Secondary | ICD-10-CM

## 2018-02-19 ENCOUNTER — Other Ambulatory Visit: Payer: Self-pay | Admitting: Geriatric Medicine

## 2018-02-19 ENCOUNTER — Ambulatory Visit
Admission: RE | Admit: 2018-02-19 | Discharge: 2018-02-19 | Disposition: A | Discharge status: Home / Self Care | Payer: Medicare Other | Source: Ambulatory Visit | Attending: Geriatric Medicine | Admitting: Geriatric Medicine

## 2018-02-19 DIAGNOSIS — K5900 Constipation, unspecified: Secondary | ICD-10-CM | POA: Diagnosis not present

## 2018-02-19 DIAGNOSIS — R911 Solitary pulmonary nodule: Secondary | ICD-10-CM

## 2018-02-19 DIAGNOSIS — R1013 Epigastric pain: Secondary | ICD-10-CM

## 2018-02-19 MED ORDER — IOPAMIDOL (ISOVUE-300) INJECTION 61%
100.0000 mL | Freq: Once | INTRAVENOUS | Status: AC | PRN
  Administered 2018-02-19: 100 mL via INTRAVENOUS

## 2018-02-20 ENCOUNTER — Ambulatory Visit
Admission: RE | Admit: 2018-02-20 | Discharge: 2018-02-20 | Disposition: A | Discharge status: Home / Self Care | Payer: Medicare Other | Source: Ambulatory Visit | Attending: Geriatric Medicine | Admitting: Geriatric Medicine

## 2018-02-20 DIAGNOSIS — R911 Solitary pulmonary nodule: Secondary | ICD-10-CM

## 2018-02-20 DIAGNOSIS — R918 Other nonspecific abnormal finding of lung field: Secondary | ICD-10-CM | POA: Diagnosis not present

## 2018-03-07 DIAGNOSIS — M810 Age-related osteoporosis without current pathological fracture: Secondary | ICD-10-CM | POA: Diagnosis not present

## 2018-03-07 DIAGNOSIS — M8588 Other specified disorders of bone density and structure, other site: Secondary | ICD-10-CM | POA: Diagnosis not present

## 2018-03-27 DIAGNOSIS — H6123 Impacted cerumen, bilateral: Secondary | ICD-10-CM | POA: Diagnosis not present

## 2018-04-18 DIAGNOSIS — R1013 Epigastric pain: Secondary | ICD-10-CM | POA: Diagnosis not present

## 2018-04-18 DIAGNOSIS — Z86010 Personal history of colonic polyps: Secondary | ICD-10-CM | POA: Diagnosis not present

## 2018-04-18 DIAGNOSIS — R933 Abnormal findings on diagnostic imaging of other parts of digestive tract: Secondary | ICD-10-CM | POA: Diagnosis not present

## 2018-05-01 DIAGNOSIS — Z85828 Personal history of other malignant neoplasm of skin: Secondary | ICD-10-CM | POA: Diagnosis not present

## 2018-05-01 DIAGNOSIS — Z85820 Personal history of malignant melanoma of skin: Secondary | ICD-10-CM | POA: Diagnosis not present

## 2018-05-01 DIAGNOSIS — L723 Sebaceous cyst: Secondary | ICD-10-CM | POA: Diagnosis not present

## 2018-06-06 DIAGNOSIS — R002 Palpitations: Secondary | ICD-10-CM | POA: Diagnosis not present

## 2018-06-06 DIAGNOSIS — F5101 Primary insomnia: Secondary | ICD-10-CM | POA: Diagnosis not present

## 2018-06-16 ENCOUNTER — Encounter (HOSPITAL_COMMUNITY): Payer: Self-pay | Admitting: Emergency Medicine

## 2018-06-16 ENCOUNTER — Emergency Department (HOSPITAL_COMMUNITY)
Admission: EM | Admit: 2018-06-16 | Discharge: 2018-06-16 | Disposition: A | Discharge status: Home / Self Care | Payer: Medicare Other | Attending: Emergency Medicine | Admitting: Emergency Medicine

## 2018-06-16 ENCOUNTER — Emergency Department (HOSPITAL_COMMUNITY): Payer: Medicare Other

## 2018-06-16 DIAGNOSIS — Z79899 Other long term (current) drug therapy: Secondary | ICD-10-CM | POA: Diagnosis not present

## 2018-06-16 DIAGNOSIS — R0602 Shortness of breath: Secondary | ICD-10-CM | POA: Diagnosis not present

## 2018-06-16 DIAGNOSIS — Z7982 Long term (current) use of aspirin: Secondary | ICD-10-CM | POA: Insufficient documentation

## 2018-06-16 DIAGNOSIS — I447 Left bundle-branch block, unspecified: Secondary | ICD-10-CM | POA: Diagnosis not present

## 2018-06-16 DIAGNOSIS — I10 Essential (primary) hypertension: Secondary | ICD-10-CM | POA: Diagnosis not present

## 2018-06-16 LAB — BASIC METABOLIC PANEL
Anion gap: 9 (ref 5–15)
BUN: 9 mg/dL (ref 8–23)
CHLORIDE: 108 mmol/L (ref 98–111)
CO2: 22 mmol/L (ref 22–32)
Calcium: 9.3 mg/dL (ref 8.9–10.3)
Creatinine, Ser: 0.74 mg/dL (ref 0.44–1.00)
GFR calc Af Amer: >60 mL/min (ref >60)
GFR calc non Af Amer: >60 mL/min (ref >60)
GLUCOSE: 149 mg/dL — AB (ref 70–99)
POTASSIUM: 3.6 mmol/L (ref 3.5–5.1)
Sodium: 139 mmol/L (ref 135–145)

## 2018-06-16 LAB — I-STAT TROPONIN, ED
Troponin i, poc: 0 ng/mL (ref 0.00–0.08)
Troponin i, poc: 0 ng/mL (ref 0.00–0.08)

## 2018-06-16 LAB — CBC
HEMATOCRIT: 47.4 % — AB (ref 36.0–46.0)
HEMOGLOBIN: 15.5 g/dL — AB (ref 12.0–15.0)
MCH: 30.5 pg (ref 26.0–34.0)
MCHC: 32.7 g/dL (ref 30.0–36.0)
MCV: 93.1 fL (ref 78.0–100.0)
Platelets: 364 10*3/uL (ref 150–400)
RBC: 5.09 MIL/uL (ref 3.87–5.11)
RDW: 12.5 % (ref 11.5–15.5)
WBC: 7.9 10*3/uL (ref 4.0–10.5)

## 2018-06-16 MED ORDER — LACTATED RINGERS IV BOLUS
1000.0000 mL | Freq: Once | INTRAVENOUS | Status: AC
  Administered 2018-06-16: 1000 mL via INTRAVENOUS

## 2018-06-16 NOTE — ED Triage Notes (Signed)
Pt states several days of shortness of breath, not worse with activity. Denies chest pain. Also endorses bilateral ankle tightness and top of feet tightness. No edema noted. Pt has hx of BBB. EKG shows the same.

## 2018-06-16 NOTE — ED Provider Notes (Signed)
[facility]ER[facility] DEPARTMENT Provider Note   CSN: [id number] Arrival date & time: 06/16/18  1404     History   Chief Complaint Chief Complaint  Patient presents with  . Shortness of Breath  . Ankle Pain    HPI Julia [name] is a 82 y.o. female with hyperlipidemia, GERD, and mild aortic insufficiency who presents due to 2 days of shortness of breath.  She says that she has felt intermittently short of breath over the past 2 days.  She reports having a mildly productive cough.  She denies having any fevers at home.  She denies having any episodes of chest pain.  She has no history of MI or VTE.  She denies nausea, vomiting, and diarrhea.  She denies worsening leg swelling.  She has had no recent travel.  She has never smoked before.  She exercises daily.  HPI  Past Medical History:  Diagnosis Date  . Aortic insufficiency    a. mild by echo 2016.  Marland Kitchen Chest pain    Nuclear March, 2011, normal, ejection fraction 64%  . Dyslipidemia   . Elevated blood pressure reading   . GERD (gastroesophageal reflux disease)    Patient sometimes has slight right lower jaw discomfort at the time of her reflux  . LBBB (left bundle branch block)   . Pericardial effusion    Echo December, 2009, small, incidental  . Shoulder pain    Left scapular pain, appeared to be radicular    Patient Active Problem List   Diagnosis Date Noted  . Shortness of breath 07/23/2015  . Dyslipidemia   . Blood pressure alteration   . GERD (gastroesophageal reflux disease)   . Ejection fraction   . Aortic insufficiency   . Shoulder pain   . Chest pain   . Pericardial effusion   . Left bundle branch block 01/03/2010    Past Surgical History:  Procedure Laterality Date  . [place] VITRECTOMY WITH 20 GAUGE MVR PORT FOR MACULAR HOLE  2000  . BREAST BIOPSY  1983   benign  . CHOLECYSTECTOMY  1984  . MELANOMA EXCISION  2012, 2013  . NASAL SEPTUM SURGERY  1993  . ROTATOR CUFF  REPAIR  2002     OB History   None      Home Medications    Prior to Admission medications   Medication Sig Start Date End Date Taking? Authorizing Provider  ALPRAZolam Duanne Moron) 0.5 MG tablet Take 0.5 mg by mouth as needed for anxiety.     [provider]  aspirin 81 MG EC tablet Take 81 mg by mouth daily.      [provider]  Cholecalciferol (VITAMIN D3) 2000 UNITS capsule Take 2,000 Units by mouth daily.    [provider]  Melatonin 1 MG/ML LIQD Take 3 mLs by mouth at bedtime.     [provider]  Multiple Vitamin (MULTIVITAMIN) tablet Take 1 tablet by mouth daily.     [provider]  OLIVE LEAF EXTRACT PO Take 1 tablet by mouth 2 (two) times daily. 900mg  each    [provider]  Omega-3 Fatty Acids (FISH OIL CONCENTRATE PO) Take 1,600 mg by mouth daily.     [provider]  omeprazole (PRILOSEC) 20 MG capsule Take 20 mg by mouth as needed. 07/03/17   [provider]  OVER THE COUNTER MEDICATION Take 1 capsule by mouth daily. ULTIMATE BONE SUPPORT    [provider]  Propylene Glycol 0.6 % SOLN Place 1 drop into both eyes as needed.    [provider]    Family History Family History  Problem Relation Age of Onset  . Emphysema Father   . Pneumonia Father   . COPD Father   . Heart murmur Mother   . Pneumonia Mother   . Heart attack Neg Hx   . Hypertension Neg Hx   . Stroke Neg Hx     Social History Social History   Tobacco Use  . Smoking status: Never Smoker  . Smokeless tobacco: Never Used  Substance Use Topics  . Alcohol use: Yes  . Drug use: No     Allergies   Pravastatin   Review of Systems Review of Systems Review of Systems   Constitutional  Negative for fever  Negative for chills  HENT  Negative for ear pain  Negative for sore throat  Negative for difficultly swallowing  Eyes  Negative for eye pain  Negative for visual disturbance  Respiratory  +for  shortness of breath  +for cough  CV  Negative for chest pain  Negative for leg swelling  Abdomen  Negative for abdominal pain  Negative for nausea  Negative for vomiting  MSK  Negative for extremity pain  Negative for back pain  Skin  Negative for rash  Negative for wound  Neuro  Negative for syncope  Negative for difficultly speaking  Psych  Negative for confusion   The remainder of the ROS was reviewed and negative except as documented above.      Physical Exam Updated Vital Signs BP (!) 166/90 (BP Location: Left Arm)   Pulse 80   Temp 98.2 F (36.8 C)   Resp 16   SpO2 98%   Physical Exam Physical Exam Constitutional  Nursing notes reviewed  Vital signs reviewed  HEENT  No obvious trauma  Supple without meningismus, mass, or overt JVD  EOMI  No scleral icterus or injection  Respiratory  Effort normal  CTAB  No respiratory distress  CV  Normal rate  No obvious murmurs  No pitting edema  Equal pulses in all extremities  No peripheral edema  Abdomen  Soft  Non-tender  Non-distended  No peritonitis  MSK  Atraumatic  No obvious deformity  ROM appropriate  Skin  Warm  Dry  Neuro  Awake and alert  EOMI  Moving all extremities  Psychiatric  Mood and affect normal        ED Treatments / Results  Labs (all labs ordered are listed, but only abnormal results are displayed) Labs Reviewed  BASIC METABOLIC PANEL - Abnormal; Notable for the following components:      Result Value   Glucose, Bld 149 (*)    All other components within normal limits  CBC - Abnormal; Notable for the following components:   Hemoglobin 15.5 (*)    HCT 47.4 (*)    All other components within normal limits  I-STAT TROPONIN, ED  I-STAT TROPONIN, ED    EKG EKG Interpretation  Date/Time:  Sunday June 16 2018 14:09:00 EDT Ventricular Rate:  88 PR Interval:  168 QRS Duration: 130 QT Interval:  386 QTC Calculation: 467 R  Axis:   -54 Text Interpretation:  Sinus rhythm with occasional Premature ventricular complexes Left axis deviation Non-specific intra-ventricular conduction block Cannot rule out Anteroseptal infarct , age undetermined T wave abnormality, consider lateral ischemia Abnormal ECG Confirmed by Elnora Morrison [phone]) on 06/16/2018 5:50:04 PM   Radiology Dg  Chest 2 View  Result Date: 06/16/2018 CLINICAL DATA:  Shortness of breath EXAM: CHEST - 2 VIEW COMPARISON:  Chest CT February 20, 2018 FINDINGS: There is no appreciable edema or consolidation. The heart size and pulmonary vascularity are normal. No adenopathy. There is postoperative change in the right shoulder. IMPRESSION: No edema or consolidation. Electronically Signed   By: Lowella Grip III M.D.   On: 06/16/2018 14:54    Procedures Procedures (including critical care time)  Medications Ordered in ED Medications  lactated ringers bolus 1,000 mL (has no administration in time range)     Initial Impression / Assessment and Plan / ED Course  I have reviewed the triage vital signs and the nursing notes.  Pertinent labs & imaging results that were available during my care of the patient were reviewed by me and considered in my medical decision making (see chart for details).     Julia [name] presents due to 2 days of shortness of breath as per above.  She has had no chest pain.  I have a very low suspicion for ACS, aortic dissection, myo/pericarditis, PE, COPD, asthma, and pneumonia given her overall presentation and physical exam.  Serial troponins were negative.  Her ECG was nonischemic and revealed her chronic left bundle branch block.  She had no acute abnormalities on screening labs.  Chest x-ray revealed no evidence of pneumonia or other acute cardiopulmonary abnormality.  She remained well-appearing and stable on multiple reassessments.  I believe that she is safe for discharge with close outpatient follow-up.  She felt safe and  agreed with this plan.  I provided ED return precautions.  Final Clinical Impressions(s) / ED Diagnoses   Final diagnoses:  None    ED Discharge Orders    None       Alford Highland, MD 06/16/18 Casimer Lanius    Elnora Morrison, MD 06/17/18 0010

## 2018-06-16 NOTE — Discharge Instructions (Addendum)
Julia [name]:  Thank you for allowing [place] to take care of you today.  We hope you begin feeling better soon.  To-Do: Please follow-up with your primary doctor Please return to the Emergency Department or call 911 if you experience chest pain, shortness of breath, severe pain, severe fever, altered mental status, or have any reason to think that you need emergency medical care.  Thank you again.  Hope you feel better soon.

## 2018-06-17 DIAGNOSIS — R0602 Shortness of breath: Secondary | ICD-10-CM | POA: Diagnosis not present

## 2018-06-24 DIAGNOSIS — G4733 Obstructive sleep apnea (adult) (pediatric): Secondary | ICD-10-CM | POA: Diagnosis not present

## 2018-07-08 ENCOUNTER — Other Ambulatory Visit: Payer: Self-pay | Admitting: Geriatric Medicine

## 2018-07-08 DIAGNOSIS — Z1231 Encounter for screening mammogram for malignant neoplasm of breast: Secondary | ICD-10-CM

## 2018-07-11 DIAGNOSIS — G4733 Obstructive sleep apnea (adult) (pediatric): Secondary | ICD-10-CM | POA: Diagnosis not present

## 2018-07-11 DIAGNOSIS — R202 Paresthesia of skin: Secondary | ICD-10-CM | POA: Diagnosis not present

## 2018-07-22 ENCOUNTER — Ambulatory Visit (INDEPENDENT_AMBULATORY_CARE_PROVIDER_SITE_OTHER): Payer: Self-pay | Admitting: Neurology

## 2018-07-22 ENCOUNTER — Encounter: Payer: Self-pay | Admitting: Neurology

## 2018-07-22 ENCOUNTER — Ambulatory Visit (INDEPENDENT_AMBULATORY_CARE_PROVIDER_SITE_OTHER): Payer: Medicare Other | Admitting: Neurology

## 2018-07-22 DIAGNOSIS — R202 Paresthesia of skin: Secondary | ICD-10-CM | POA: Diagnosis not present

## 2018-07-22 DIAGNOSIS — R0602 Shortness of breath: Secondary | ICD-10-CM

## 2018-07-22 NOTE — Procedures (Signed)
     HISTORY:  Julia [name] is an 82 year old patient with history of mild intermittent stable paresthesias involving the left hand since 2004.  She comes to the office mainly concerned about a tight sensation that has developed across the distal feet bilaterally that began several months ago, left greater than right.  She reports no back pain or pain down the legs or change in balance or strength of the legs.  She is being evaluated for a possible neuropathy or a lumbosacral radiculopathy.  NERVE CONDUCTION STUDIES:  Nerve conduction studies were performed on the left upper extremity. The distal motor latencies and motor amplitudes for the median and ulnar nerves were within normal limits. The nerve conduction velocities for these nerves were also normal. The sensory latencies for the median and ulnar nerves were normal. The F wave latency for the ulnar nerve was within normal limits.  Nerve conduction studies were performed on the left lower extremity. The distal motor latencies and motor amplitudes for the peroneal and posterior tibial nerves were within normal limits. The nerve conduction velocities for these nerves were also normal. The sensory latencies for the peroneal and sural nerves were within normal limits. The F wave latency for the posterior tibial nerve was within normal limits.   EMG STUDIES:  EMG study was performed on the left lower extremity:  The tibialis anterior muscle reveals 2 to 4K motor units with full recruitment. No fibrillations or positive waves were seen. The peroneus tertius muscle reveals 2 to 4K motor units with full recruitment. No fibrillations or positive waves were seen. The medial gastrocnemius muscle reveals 1 to 3K motor units with full recruitment. No fibrillations or positive waves were seen. The vastus lateralis muscle reveals 2 to 4K motor units with full recruitment. No fibrillations or positive waves were seen. The iliopsoas muscle reveals 2 to 4K  motor units with full recruitment. No fibrillations or positive waves were seen. The biceps femoris muscle (long head) reveals 2 to 4K motor units with full recruitment. No fibrillations or positive waves were seen. The lumbosacral paraspinal muscles were tested at 3 levels, and revealed no abnormalities of insertional activity at all 3 levels tested. There was good relaxation.   IMPRESSION:  Nerve conduction studies done on the left arm and left leg were unremarkable, no evidence of a neuropathy is seen.  EMG evaluation of the left lower extremity was unremarkable, no evidence of an overlying lumbosacral radiculopathy was noted.  Jill Alexanders MD 07/22/2018 1:38 PM  [facility] [address] [place], [name] [address]  Phone [phone] Fax [phone]

## 2018-07-22 NOTE — Progress Notes (Signed)
Please refer to EMG and nerve conduction procedure note.  

## 2018-07-24 DIAGNOSIS — G4700 Insomnia, unspecified: Secondary | ICD-10-CM | POA: Diagnosis not present

## 2018-07-24 NOTE — Progress Notes (Signed)
[place]    Nerve / Sites Muscle Latency Ref. Amplitude Ref. Rel Amp Segments Distance Velocity Ref. Area    ms ms mV mV %  cm m/s m/s mVms  L Median - APB     Wrist APB 3.6 ?4.4 6.5 ?4.0 100 Wrist - APB 7   25.3     Upper arm APB 7.6  6.5  99.5 Upper arm - Wrist 20 50 ?49 24.0  L Ulnar - ADM     Wrist ADM 2.6 ?3.3 7.5 ?6.0 100 Wrist - ADM 7   23.3     B.Elbow ADM 5.4  7.4  99.3 B.Elbow - Wrist 17 59 ?49 25.0     A.Elbow ADM 7.7  7.2  97.5 A.Elbow - B.Elbow 12 52 ?49 25.2         A.Elbow - Wrist      L Peroneal - EDB     Ankle EDB 4.8 ?6.5 2.9 ?2.0 100 Ankle - EDB 9   11.3     Fib head EDB 10.8  2.6  91.4 Fib head - Ankle 28 47 ?44 10.3     Pop fossa EDB 13.4  2.5  94.5 Pop fossa - Fib head 12 46 ?44 10.7         Pop fossa - Ankle      L Tibial - AH     Ankle AH 4.1 ?5.8 11.7 ?4.0 100 Ankle - AH 9   27.9     Pop fossa AH 12.7  8.3  70.8 Pop fossa - Ankle 37 43 ?41 24.1             SNC    Nerve / Sites Rec. Site Peak Lat Ref.  Amp Ref. Segments Distance    ms ms V V  cm  L Sural - Ankle (Calf)     Calf Ankle 4.2 ?4.4 6 ?6 Calf - Ankle 14  L Superficial peroneal - Ankle     Lat leg Ankle 4.4 ?4.4 6 ?6 Lat leg - Ankle 14  L Median - Orthodromic (Dig II, Mid palm)     Dig II Wrist 3.2 ?3.4 27 ?10 Dig II - Wrist 13  L Ulnar - Orthodromic, (Dig V, Mid palm)     Dig V Wrist 2.7 ?3.1 10 ?5 Dig V - Wrist 72              F  Wave    Nerve F Lat Ref.   ms ms  L Tibial - AH 47.3 ?56.0  L Ulnar - ADM 24.7 ?32.0

## 2018-07-25 DIAGNOSIS — G4710 Hypersomnia, unspecified: Secondary | ICD-10-CM | POA: Diagnosis not present

## 2018-08-08 ENCOUNTER — Ambulatory Visit
Admission: RE | Admit: 2018-08-08 | Discharge: 2018-08-08 | Disposition: A | Discharge status: Home / Self Care | Payer: Medicare Other | Source: Ambulatory Visit | Attending: Geriatric Medicine | Admitting: Geriatric Medicine

## 2018-08-08 DIAGNOSIS — Z1231 Encounter for screening mammogram for malignant neoplasm of breast: Secondary | ICD-10-CM | POA: Diagnosis not present

## 2018-08-15 ENCOUNTER — Other Ambulatory Visit: Payer: Self-pay | Admitting: Geriatric Medicine

## 2018-08-15 DIAGNOSIS — R911 Solitary pulmonary nodule: Secondary | ICD-10-CM

## 2018-08-16 DIAGNOSIS — H43812 Vitreous degeneration, left eye: Secondary | ICD-10-CM | POA: Diagnosis not present

## 2018-08-16 DIAGNOSIS — H35341 Macular cyst, hole, or pseudohole, right eye: Secondary | ICD-10-CM | POA: Diagnosis not present

## 2018-08-16 DIAGNOSIS — H33312 Horseshoe tear of retina without detachment, left eye: Secondary | ICD-10-CM | POA: Diagnosis not present

## 2018-08-16 DIAGNOSIS — Z961 Presence of intraocular lens: Secondary | ICD-10-CM | POA: Diagnosis not present

## 2018-08-20 ENCOUNTER — Ambulatory Visit
Admission: RE | Admit: 2018-08-20 | Discharge: 2018-08-20 | Disposition: A | Discharge status: Home / Self Care | Payer: Medicare Other | Source: Ambulatory Visit | Attending: Geriatric Medicine | Admitting: Geriatric Medicine

## 2018-08-20 DIAGNOSIS — R918 Other nonspecific abnormal finding of lung field: Secondary | ICD-10-CM | POA: Diagnosis not present

## 2018-08-20 DIAGNOSIS — R911 Solitary pulmonary nodule: Secondary | ICD-10-CM

## 2018-08-26 ENCOUNTER — Encounter: Payer: Self-pay | Admitting: Gastroenterology

## 2018-08-26 DIAGNOSIS — R202 Paresthesia of skin: Secondary | ICD-10-CM | POA: Diagnosis not present

## 2018-08-26 DIAGNOSIS — H6123 Impacted cerumen, bilateral: Secondary | ICD-10-CM | POA: Diagnosis not present

## 2018-08-26 DIAGNOSIS — Z79899 Other long term (current) drug therapy: Secondary | ICD-10-CM | POA: Diagnosis not present

## 2018-08-26 DIAGNOSIS — F419 Anxiety disorder, unspecified: Secondary | ICD-10-CM | POA: Diagnosis not present

## 2018-08-26 DIAGNOSIS — Z23 Encounter for immunization: Secondary | ICD-10-CM | POA: Diagnosis not present

## 2018-08-26 DIAGNOSIS — Z0000 Encounter for general adult medical examination without abnormal findings: Secondary | ICD-10-CM | POA: Diagnosis not present

## 2018-08-26 DIAGNOSIS — Z1389 Encounter for screening for other disorder: Secondary | ICD-10-CM | POA: Diagnosis not present

## 2018-09-04 DIAGNOSIS — E7800 Pure hypercholesterolemia, unspecified: Secondary | ICD-10-CM | POA: Diagnosis not present

## 2018-09-04 DIAGNOSIS — Z0000 Encounter for general adult medical examination without abnormal findings: Secondary | ICD-10-CM | POA: Diagnosis not present

## 2018-09-24 DIAGNOSIS — Z01419 Encounter for gynecological examination (general) (routine) without abnormal findings: Secondary | ICD-10-CM | POA: Diagnosis not present

## 2018-09-24 DIAGNOSIS — Z780 Asymptomatic menopausal state: Secondary | ICD-10-CM | POA: Diagnosis not present

## 2018-10-07 ENCOUNTER — Encounter: Payer: Self-pay | Admitting: Interventional Cardiology

## 2018-10-07 ENCOUNTER — Ambulatory Visit (INDEPENDENT_AMBULATORY_CARE_PROVIDER_SITE_OTHER): Payer: Medicare Other | Admitting: Interventional Cardiology

## 2018-10-07 VITALS — BP 138/84 | HR 93 | Ht 65.0 in | Wt 126.4 lb

## 2018-10-07 DIAGNOSIS — I351 Nonrheumatic aortic (valve) insufficiency: Secondary | ICD-10-CM | POA: Diagnosis not present

## 2018-10-07 DIAGNOSIS — I447 Left bundle-branch block, unspecified: Secondary | ICD-10-CM | POA: Diagnosis not present

## 2018-10-07 DIAGNOSIS — E785 Hyperlipidemia, unspecified: Secondary | ICD-10-CM

## 2018-10-07 NOTE — Progress Notes (Signed)
Cardiology Office Note   Date:  10/07/2018   ID:  Julia [name], DOB Jan 16, 1936, MRN [id number]  PCP:  Lajean Manes, MD    No chief complaint on file.  Aortic insufficiency  Wt Readings from Last 3 Encounters:  10/07/18 126 lb 6.4 oz (57.3 kg)  09/18/17 128 lb 3.2 oz (58.2 kg)  06/25/17 129 lb (58.5 kg)       History of Present Illness: Julia [name] is a 82 y.o. female Who has had a LBBB and mild aortic insufficiency. She had a normal stress test in 2011. Echo in 2016 showed normal LV function with mild AI.   She was limited by torn foot ligaments in 2016. This resolved.  She was given a prescription for losartan/HCTZ 50/12.5 mg but did not want to take it. BP in our office with her cuff correlated to our reading in the office n the past.  BP readings at home have been in the 314-970 systolic range, over the last few months.   Known severe LVH by echo.  She declined ACE-I.   2018 echo: - Left ventricle: Abnormal septal motion. The cavity size was   mildly dilated. There was severe concentric hypertrophy. Systolic   function was normal. The estimated ejection fraction was in the   range of 50% to 55%. Wall motion was normal; there were no   regional wall motion abnormalities. Doppler parameters are   consistent with abnormal left ventricular relaxation (grade 1   diastolic dysfunction). - Aortic valve: There was mild regurgitation. - Mitral valve: There was mild regurgitation. - Atrial septum: No defect or patent foramen ovale was identified.   She [facility]x/[facility] at the [facility].  Overall, she feels well.  BP is in the 100-120 mm Hg.   Denies : Chest pain. Dizziness. Leg edema. Nitroglycerin use. Orthopnea. Palpitations. Paroxysmal nocturnal dyspnea. Syncope.       Past Medical History:  Diagnosis Date  . Aortic insufficiency    a. mild by echo 2016.  Marland Kitchen Chest pain    Nuclear March, 2011, normal, ejection fraction 64%  . Dyslipidemia   .  Elevated blood pressure reading   . GERD (gastroesophageal reflux disease)    Patient sometimes has slight right lower jaw discomfort at the time of her reflux  . LBBB (left bundle branch block)   . Pericardial effusion    Echo December, 2009, small, incidental  . Shoulder pain    Left scapular pain, appeared to be radicular    Past Surgical History:  Procedure Laterality Date  . [place] VITRECTOMY WITH 20 GAUGE MVR PORT FOR MACULAR HOLE  2000  . BREAST BIOPSY  1983   benign  . CHOLECYSTECTOMY  1984  . MELANOMA EXCISION  2012, 2013  . NASAL SEPTUM SURGERY  1993  . ROTATOR CUFF REPAIR  2002     Current Outpatient Medications  Medication Sig Dispense Refill  . ALPRAZolam (XANAX) 0.5 MG tablet Take 0.5 mg by mouth as needed for anxiety.     . Cholecalciferol (VITAMIN D3) 2000 UNITS capsule Take 2,000 Units by mouth daily.    . Melatonin 1 MG/ML LIQD Take 3 mLs by mouth at bedtime.     . Multiple Vitamin (MULTIVITAMIN) tablet Take 1 tablet by mouth daily.     Marland Kitchen OLIVE LEAF EXTRACT PO Take 1 tablet by mouth 2 (two) times daily. 900mg  each    . Omega-3 Fatty Acids (FISH OIL CONCENTRATE PO) Take 1,600  mg by mouth daily.     Marland Kitchen OVER THE COUNTER MEDICATION Take 1 capsule by mouth daily. ULTIMATE BONE SUPPORT    . Propylene Glycol 0.6 % SOLN Place 1 drop into both eyes as needed.     No current facility-administered medications for this visit.     Allergies:   Pravastatin    Social History:  The patient  reports that she has never smoked. She has never used smokeless tobacco. She reports that she drinks alcohol. She reports that she does not use drugs.   Family History:  The patient's family history includes COPD in her father; Emphysema in her father; Heart murmur in her mother; Pneumonia in her father and mother.    ROS:  Please see the history of present illness.   Otherwise, review of systems are positive for sinus problems when lying in bed.   All other systems are  reviewed and negative.    PHYSICAL EXAM: VS:  BP 138/84   Pulse 93   Ht 5\' 5"  (1.651 m)   Wt 126 lb 6.4 oz (57.3 kg)   SpO2 97%   BMI 21.03 kg/m  , BMI Body mass index is 21.03 kg/m. GEN: Well nourished, well developed, in no acute distress  HEENT: normal  Neck: no JVD, carotid bruits, or masses Cardiac: RRR; no murmurs, rubs, or gallops,no edema  Respiratory:  clear to auscultation bilaterally, normal work of breathing GI: soft, nontender, nondistended, + BS MS: no deformity or atrophy  Skin: warm and dry, no rash Neuro:  Strength and sensation are intact Psych: euthymic mood, full affect   EKG:   The ekg ordered in 8/19 demonstrates NSR, LBBB   Recent Labs: 06/16/2018: BUN 9; Creatinine, Ser 0.74; Hemoglobin 15.5; Platelets 364; Potassium 3.6; Sodium 139   Lipid Panel No results found for: CHOL, TRIG, HDL, CHOLHDL, VLDL, LDLCALC, LDLDIRECT   Other studies Reviewed: Additional studies/ records that were reviewed today with results demonstrating: Labs from 11/19.  LDL 101, HDL 74.   ASSESSMENT AND PLAN:  1. AI: No sign of CHF.  Mild by prior echo.  What she described when lying down, momentary SHOB which resolves with turning to one side, is not cardiac related.  No SHOB with exercise.  2. LBBB.  Noted on prior ECG.  3. Elevated BP: Resolved.  I reviewed her home readings. 4. Hyperlipidemia: Improved 5. Severe LVH: No sign of CHF.     Current medicines are reviewed at length with the patient today.  The patient concerns regarding her medicines were addressed.  The following changes have been made:  No change  Labs/ tests ordered today include:  No orders of the defined types were placed in this encounter.   Recommend 150 minutes/week of aerobic exercise Low fat, low carb, high fiber diet recommended  Disposition:   FU in 1 year   Signed, Larae Grooms, MD  10/07/2018 9:37 AM    [facility] [place], [place], [place]   [address] Phone: [phone]; Fax: [phone]

## 2018-10-07 NOTE — Patient Instructions (Signed)

## 2018-11-06 DIAGNOSIS — L509 Urticaria, unspecified: Secondary | ICD-10-CM | POA: Diagnosis not present

## 2018-11-07 DIAGNOSIS — Z85820 Personal history of malignant melanoma of skin: Secondary | ICD-10-CM | POA: Diagnosis not present

## 2018-11-07 DIAGNOSIS — Z85828 Personal history of other malignant neoplasm of skin: Secondary | ICD-10-CM | POA: Diagnosis not present

## 2018-11-07 DIAGNOSIS — L509 Urticaria, unspecified: Secondary | ICD-10-CM | POA: Diagnosis not present

## 2018-12-20 DIAGNOSIS — H35341 Macular cyst, hole, or pseudohole, right eye: Secondary | ICD-10-CM | POA: Diagnosis not present

## 2018-12-20 DIAGNOSIS — Z961 Presence of intraocular lens: Secondary | ICD-10-CM | POA: Diagnosis not present

## 2018-12-20 DIAGNOSIS — H33312 Horseshoe tear of retina without detachment, left eye: Secondary | ICD-10-CM | POA: Diagnosis not present

## 2018-12-20 DIAGNOSIS — G43109 Migraine with aura, not intractable, without status migrainosus: Secondary | ICD-10-CM | POA: Diagnosis not present

## 2018-12-20 DIAGNOSIS — H43812 Vitreous degeneration, left eye: Secondary | ICD-10-CM | POA: Diagnosis not present

## 2018-12-24 DIAGNOSIS — Z79899 Other long term (current) drug therapy: Secondary | ICD-10-CM | POA: Diagnosis not present

## 2018-12-24 DIAGNOSIS — R0601 Orthopnea: Secondary | ICD-10-CM | POA: Diagnosis not present

## 2018-12-24 DIAGNOSIS — E7800 Pure hypercholesterolemia, unspecified: Secondary | ICD-10-CM | POA: Diagnosis not present

## 2018-12-24 DIAGNOSIS — G43109 Migraine with aura, not intractable, without status migrainosus: Secondary | ICD-10-CM | POA: Diagnosis not present

## 2018-12-24 DIAGNOSIS — R5383 Other fatigue: Secondary | ICD-10-CM | POA: Diagnosis not present

## 2018-12-31 ENCOUNTER — Emergency Department (HOSPITAL_COMMUNITY)
Admission: EM | Admit: 2018-12-31 | Discharge: 2018-12-31 | Disposition: A | Discharge status: Home / Self Care | Payer: Medicare Other | Attending: Emergency Medicine | Admitting: Emergency Medicine

## 2018-12-31 ENCOUNTER — Encounter (HOSPITAL_COMMUNITY): Payer: Self-pay

## 2018-12-31 ENCOUNTER — Emergency Department (HOSPITAL_COMMUNITY): Payer: Medicare Other

## 2018-12-31 ENCOUNTER — Other Ambulatory Visit: Payer: Self-pay

## 2018-12-31 DIAGNOSIS — E785 Hyperlipidemia, unspecified: Secondary | ICD-10-CM | POA: Insufficient documentation

## 2018-12-31 DIAGNOSIS — R0602 Shortness of breath: Secondary | ICD-10-CM

## 2018-12-31 DIAGNOSIS — Z79899 Other long term (current) drug therapy: Secondary | ICD-10-CM | POA: Insufficient documentation

## 2018-12-31 DIAGNOSIS — F419 Anxiety disorder, unspecified: Secondary | ICD-10-CM

## 2018-12-31 DIAGNOSIS — I447 Left bundle-branch block, unspecified: Secondary | ICD-10-CM | POA: Diagnosis not present

## 2018-12-31 LAB — BASIC METABOLIC PANEL
Anion gap: 11 (ref 5–15)
BUN: 8 mg/dL (ref 8–23)
CALCIUM: 9.2 mg/dL (ref 8.9–10.3)
CO2: 22 mmol/L (ref 22–32)
CREATININE: 0.87 mg/dL (ref 0.44–1.00)
Chloride: 99 mmol/L (ref 98–111)
GFR calc Af Amer: >60 mL/min (ref >60)
GFR calc non Af Amer: >60 mL/min (ref >60)
GLUCOSE: 130 mg/dL — AB (ref 70–99)
Potassium: 4 mmol/L (ref 3.5–5.1)
Sodium: 132 mmol/L — ABNORMAL LOW (ref 135–145)

## 2018-12-31 LAB — I-STAT TROPONIN, ED: Troponin i, poc: 0 ng/mL (ref 0.00–0.08)

## 2018-12-31 LAB — CBC
HCT: 46.8 % — ABNORMAL HIGH (ref 36.0–46.0)
Hemoglobin: 15.1 g/dL — ABNORMAL HIGH (ref 12.0–15.0)
MCH: 29.4 pg (ref 26.0–34.0)
MCHC: 32.3 g/dL (ref 30.0–36.0)
MCV: 91.2 fL (ref 80.0–100.0)
PLATELETS: 386 10*3/uL (ref 150–400)
RBC: 5.13 MIL/uL — ABNORMAL HIGH (ref 3.87–5.11)
RDW: 12.3 % (ref 11.5–15.5)
WBC: 7.6 10*3/uL (ref 4.0–10.5)
nRBC: 0 % (ref 0.0–0.2)

## 2018-12-31 MED ORDER — SODIUM CHLORIDE 0.9% FLUSH: 3.0000 mL | Freq: Once | INTRAVENOUS | Status: DC

## 2018-12-31 MED ORDER — ALPRAZOLAM 0.25 MG PO TABS
0.2500 mg | ORAL_TABLET | Freq: Once | ORAL | Status: AC
  Administered 2018-12-31: 0.25 mg via ORAL
  Filled 2018-12-31: qty 1

## 2018-12-31 NOTE — Discharge Instructions (Addendum)
1.  You may take Xanax 0.25 mg up to 2 times per day if you need it for control of symptoms of anxiety.  Discontinue if you are excessively drowsy, dizzy or having other problems with the medication. 2.  See your family doctor to discuss these symptoms.  This will be for short-term control of anxiety about immediate events in your life.  This will need to be monitored closely for appropriate treatment. 3.  Return to the emergency department if you develop fever, cough, chest pain or other concerning symptoms.

## 2018-12-31 NOTE — ED Notes (Signed)
Patient verbalizes understanding of discharge instructions. Opportunity for questioning and answers were provided. Armband removed by staff, pt discharged from ED ambulatory.   

## 2018-12-31 NOTE — ED Triage Notes (Signed)
Pt reports sudden onset of tremors and shortness of breath this morning. Watch reported HR of 131. Skin warm and dry no distress noted at this time.

## 2018-12-31 NOTE — ED Provider Notes (Signed)
[facility] EMERGENCY DEPARTMENT Provider Note   CSN: [id number] Arrival date & time: 12/31/18  6433    History   Chief Complaint Chief Complaint  Patient presents with  . Shortness of Breath    HPI Julia [name] is a 83 y.o. female.     HPI Patient reports ever since about a week ago when her ophthalmologist decided to order an MRI (due to history of ocular migraines), patient has been getting episodes of feeling suddenly short of breath and shaky.  This is happened several times now.  She reports all of a sudden it just feels like she cannot catch her breath and she feels like she is shaking in the hands.  Patient reports that she has some prior history of anxiety and has Xanax available to take.  She reports she did try a dose about 3 days ago at 0.5 and it put her to sleep for much of the day.  She reports typically if she takes it, which is not very often, she takes 0.25 mg.  Patient reports that several things have happened.  She has had 3 friends die in the past month or 2.  And she is not sure if this upcoming MRI is making her very anxious.  She has Ativan to take to pretreat for the procedure.  She denies she is experiencing any chest pain.  She does not have cough or fever.  No lower extremity swelling or calf pain.  Patient reports that she goes to the gym and does a 30-minute group workout that includes cardio and weightlifting and then does some extra work on the weights.  She reports she gets no problems of chest pain fatigue or shortness of breath while doing these workouts.  Today, she was at a charity event and suddenly started to feel very tremulous in her hands and like she could not catch her breath which was what precipitated the visit to the emergency department.  Patient also reports that it had eased off but when I came in the room and we started talking her symptoms came back. Past Medical History:  Diagnosis Date  . Aortic insufficiency    a. mild  by echo 2016.  Marland Kitchen Chest pain    Nuclear March, 2011, normal, ejection fraction 64%  . Dyslipidemia   . Elevated blood pressure reading   . GERD (gastroesophageal reflux disease)    Patient sometimes has slight right lower jaw discomfort at the time of her reflux  . LBBB (left bundle branch block)   . Pericardial effusion    Echo December, 2009, small, incidental  . Shoulder pain    Left scapular pain, appeared to be radicular    Patient Active Problem List   Diagnosis Date Noted  . Shortness of breath 07/23/2015  . Dyslipidemia   . Blood pressure alteration   . GERD (gastroesophageal reflux disease)   . Ejection fraction   . Aortic insufficiency   . Shoulder pain   . Chest pain   . Pericardial effusion   . Left bundle branch block 01/03/2010    Past Surgical History:  Procedure Laterality Date  . [place] VITRECTOMY WITH 20 GAUGE MVR PORT FOR MACULAR HOLE  2000  . BREAST BIOPSY  1983   benign  . CHOLECYSTECTOMY  1984  . MELANOMA EXCISION  2012, 2013  . NASAL SEPTUM SURGERY  1993  . ROTATOR CUFF REPAIR  2002     OB History  No obstetric history on file.      Home Medications    Prior to Admission medications   Medication Sig Start Date End Date Taking? Authorizing Provider  ALPRAZolam Duanne Moron) 0.5 MG tablet Take 0.5 mg by mouth as needed for anxiety.     [provider]  Cholecalciferol (VITAMIN D3) 2000 UNITS capsule Take 2,000 Units by mouth daily.    [provider]  Melatonin 1 MG/ML LIQD Take 3 mLs by mouth at bedtime.     [provider]  Multiple Vitamin (MULTIVITAMIN) tablet Take 1 tablet by mouth daily.     [provider]  OLIVE LEAF EXTRACT PO Take 1 tablet by mouth 2 (two) times daily. 900mg  each    [provider]  Omega-3 Fatty Acids (FISH OIL CONCENTRATE PO) Take 1,600 mg by mouth daily.     [provider]  OVER THE COUNTER MEDICATION Take 1 capsule by mouth daily. ULTIMATE BONE  SUPPORT    [provider]  Propylene Glycol 0.6 % SOLN Place 1 drop into both eyes as needed.    [provider]    Family History Family History  Problem Relation Age of Onset  . Emphysema Father   . Pneumonia Father   . COPD Father   . Heart murmur Mother   . Pneumonia Mother   . Heart attack Neg Hx   . Hypertension Neg Hx   . Stroke Neg Hx     Social History Social History   Tobacco Use  . Smoking status: Never Smoker  . Smokeless tobacco: Never Used  Substance Use Topics  . Alcohol use: Yes  . Drug use: No     Allergies   Pravastatin   Review of Systems Review of Systems 10 Systems reviewed and are negative for acute change except as noted in the HPI.   Physical Exam Updated Vital Signs BP 135/80   Pulse 65   Temp (!) 97.4 F (36.3 C) (Oral)   Resp 14   SpO2 95%   Physical Exam Constitutional:      Appearance: She is well-developed.  HENT:     Head: Normocephalic and atraumatic.     Mouth/Throat:     Mouth: Mucous membranes are moist.     Pharynx: Oropharynx is clear.  Eyes:     Extraocular Movements: Extraocular movements intact.  Neck:     Musculoskeletal: Neck supple.  Cardiovascular:     Rate and Rhythm: Normal rate and regular rhythm.     Heart sounds: Normal heart sounds.  Pulmonary:     Effort: Pulmonary effort is normal.     Breath sounds: Normal breath sounds.  Abdominal:     General: Bowel sounds are normal. There is no distension.     Palpations: Abdomen is soft.     Tenderness: There is no abdominal tenderness.  Musculoskeletal: Normal range of motion.        General: No tenderness.     Right lower leg: No edema.     Left lower leg: No edema.  Skin:    General: Skin is warm and dry.  Neurological:     Mental Status: She is alert and oriented to person, place, and time.     GCS: GCS eye subscore is 4. GCS verbal subscore is 5. GCS motor subscore is 6.     Coordination: Coordination normal.      ED  Treatments / Results  Labs (all labs ordered are listed, but only abnormal  results are displayed) Labs Reviewed  BASIC METABOLIC PANEL - Abnormal; Notable for the following components:      Result Value   Sodium 132 (*)    Glucose, Bld 130 (*)    All other components within normal limits  CBC - Abnormal; Notable for the following components:   RBC 5.13 (*)    Hemoglobin 15.1 (*)    HCT 46.8 (*)    All other components within normal limits  I-STAT TROPONIN, ED    EKG EKG Interpretation  Date/Time:  Tuesday December 31 2018 08:18:17 EST Ventricular Rate:  77 PR Interval:  150 QRS Duration: 130 QT Interval:  406 QTC Calculation: 459 R Axis:   -63 Text Interpretation:  Normal sinus rhythm Left axis deviation Left bundle branch block Abnormal ECG no change from previous Confirmed by Charlesetta Shanks [phone]) on 12/31/2018 10:38:19 AM   Radiology Dg Chest 2 View  Result Date: 12/31/2018 CLINICAL DATA:  Shortness of Breath EXAM: CHEST - 2 VIEW COMPARISON:  06/16/2018 FINDINGS: Heart and mediastinal contours are within normal limits. No focal opacities or effusions. No acute bony abnormality. IMPRESSION: No active cardiopulmonary disease. Electronically Signed   By: Rolm Baptise M.D.   On: 12/31/2018 09:03    Procedures Procedures (including critical care time)  Medications Ordered in ED Medications  sodium chloride flush (NS) 0.9 % injection 3 mL (has no administration in time range)  ALPRAZolam (XANAX) tablet 0.25 mg (0.25 mg Oral Given 12/31/18 1100)     Initial Impression / Assessment and Plan / ED Course  I have reviewed the triage vital signs and the nursing notes.  Pertinent labs & imaging results that were available during my care of the patient were reviewed by me and considered in my medical decision making (see chart for details).         Findings most consistent with anxiety attacks.  Patient is clinically well in appearance.  She has been working out at [employer] and  doing physical activity with no signs of ischemia.  Patient visibly became symptomatic as we were talking in the room.  Vital signs remained stable.  At this time, patient is only taking her Xanax very rarely.  Counseled patient that at this interim time while she is experiencing a increase in stressors such as several recent deaths of friends as well as the upcoming MRI, patient could take 0.25 Xanax as needed as long as not oversedated and keep close follow-up with her PCP to discuss how much she is using and any needed additional therapy.  Final Clinical Impressions(s) / ED Diagnoses   Final diagnoses:  Shortness of breath  Anxiety    ED Discharge Orders    None       Charlesetta Shanks, MD 12/31/18 1208

## 2019-01-01 DIAGNOSIS — L821 Other seborrheic keratosis: Secondary | ICD-10-CM | POA: Diagnosis not present

## 2019-01-01 DIAGNOSIS — Z85820 Personal history of malignant melanoma of skin: Secondary | ICD-10-CM | POA: Diagnosis not present

## 2019-01-01 DIAGNOSIS — L720 Epidermal cyst: Secondary | ICD-10-CM | POA: Diagnosis not present

## 2019-01-01 DIAGNOSIS — D225 Melanocytic nevi of trunk: Secondary | ICD-10-CM | POA: Diagnosis not present

## 2019-01-01 DIAGNOSIS — D485 Neoplasm of uncertain behavior of skin: Secondary | ICD-10-CM | POA: Diagnosis not present

## 2019-01-01 DIAGNOSIS — L820 Inflamed seborrheic keratosis: Secondary | ICD-10-CM | POA: Diagnosis not present

## 2019-01-01 DIAGNOSIS — D1801 Hemangioma of skin and subcutaneous tissue: Secondary | ICD-10-CM | POA: Diagnosis not present

## 2019-01-01 DIAGNOSIS — L814 Other melanin hyperpigmentation: Secondary | ICD-10-CM | POA: Diagnosis not present

## 2019-01-01 DIAGNOSIS — L309 Dermatitis, unspecified: Secondary | ICD-10-CM | POA: Diagnosis not present

## 2019-01-01 DIAGNOSIS — Z85828 Personal history of other malignant neoplasm of skin: Secondary | ICD-10-CM | POA: Diagnosis not present

## 2019-01-05 DIAGNOSIS — G43109 Migraine with aura, not intractable, without status migrainosus: Secondary | ICD-10-CM | POA: Diagnosis not present

## 2019-01-15 ENCOUNTER — Ambulatory Visit
Admission: RE | Admit: 2019-01-15 | Discharge: 2019-01-15 | Disposition: A | Discharge status: Home / Self Care | Payer: Medicare Other | Source: Ambulatory Visit | Attending: Geriatric Medicine | Admitting: Geriatric Medicine

## 2019-01-15 ENCOUNTER — Other Ambulatory Visit: Payer: Self-pay | Admitting: Geriatric Medicine

## 2019-01-15 DIAGNOSIS — R1031 Right lower quadrant pain: Secondary | ICD-10-CM

## 2019-01-15 MED ORDER — IOPAMIDOL (ISOVUE-300) INJECTION 61%
100.0000 mL | Freq: Once | INTRAVENOUS | Status: AC | PRN
  Administered 2019-01-15: 100 mL via INTRAVENOUS

## 2019-03-31 ENCOUNTER — Other Ambulatory Visit: Payer: Self-pay | Admitting: Geriatric Medicine

## 2019-03-31 DIAGNOSIS — Z1231 Encounter for screening mammogram for malignant neoplasm of breast: Secondary | ICD-10-CM

## 2019-04-22 DIAGNOSIS — H33312 Horseshoe tear of retina without detachment, left eye: Secondary | ICD-10-CM | POA: Diagnosis not present

## 2019-04-22 DIAGNOSIS — Z961 Presence of intraocular lens: Secondary | ICD-10-CM | POA: Diagnosis not present

## 2019-04-22 DIAGNOSIS — H43812 Vitreous degeneration, left eye: Secondary | ICD-10-CM | POA: Diagnosis not present

## 2019-04-22 DIAGNOSIS — H35341 Macular cyst, hole, or pseudohole, right eye: Secondary | ICD-10-CM | POA: Diagnosis not present

## 2019-04-22 DIAGNOSIS — G43109 Migraine with aura, not intractable, without status migrainosus: Secondary | ICD-10-CM | POA: Diagnosis not present

## 2019-06-25 DIAGNOSIS — J029 Acute pharyngitis, unspecified: Secondary | ICD-10-CM | POA: Diagnosis not present

## 2019-07-01 DIAGNOSIS — Z79899 Other long term (current) drug therapy: Secondary | ICD-10-CM | POA: Diagnosis not present

## 2019-07-01 DIAGNOSIS — R232 Flushing: Secondary | ICD-10-CM | POA: Diagnosis not present

## 2019-07-15 DIAGNOSIS — L720 Epidermal cyst: Secondary | ICD-10-CM | POA: Diagnosis not present

## 2019-07-15 DIAGNOSIS — Z85820 Personal history of malignant melanoma of skin: Secondary | ICD-10-CM | POA: Diagnosis not present

## 2019-07-15 DIAGNOSIS — Z85828 Personal history of other malignant neoplasm of skin: Secondary | ICD-10-CM | POA: Diagnosis not present

## 2019-07-15 DIAGNOSIS — D225 Melanocytic nevi of trunk: Secondary | ICD-10-CM | POA: Diagnosis not present

## 2019-07-15 DIAGNOSIS — D1801 Hemangioma of skin and subcutaneous tissue: Secondary | ICD-10-CM | POA: Diagnosis not present

## 2019-07-15 DIAGNOSIS — L821 Other seborrheic keratosis: Secondary | ICD-10-CM | POA: Diagnosis not present

## 2019-07-15 DIAGNOSIS — D2271 Melanocytic nevi of right lower limb, including hip: Secondary | ICD-10-CM | POA: Diagnosis not present

## 2019-08-05 DIAGNOSIS — H6123 Impacted cerumen, bilateral: Secondary | ICD-10-CM | POA: Diagnosis not present

## 2019-08-05 DIAGNOSIS — H900 Conductive hearing loss, bilateral: Secondary | ICD-10-CM | POA: Diagnosis not present

## 2019-08-11 ENCOUNTER — Ambulatory Visit: Payer: Medicare Other

## 2019-08-13 DIAGNOSIS — Z23 Encounter for immunization: Secondary | ICD-10-CM | POA: Diagnosis not present

## 2019-08-16 IMAGING — CT CT CHEST W/O CM
2 of 4 series · 15 of 36 positions shown, 18 images · non-contrast
Comparison: CT chest dated February 20, 2018.

CLINICAL DATA: Lung nodule follow-up.

EXAM:
CT CHEST WITHOUT CONTRAST
TECHNIQUE: Multidetector CT imaging of the chest was performed following the
standard protocol without IV contrast.

[Series 2: chest 2.00 br40 s3 ax · axial · 0.70mm/px · z∈[+1485,+1763]mm · 12 of 165 slices shown, 15 images]
[im 13/165  mediastinal]
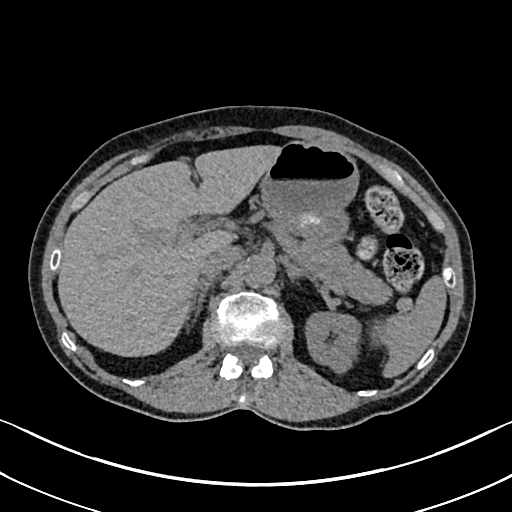
[im 13/165  lung]
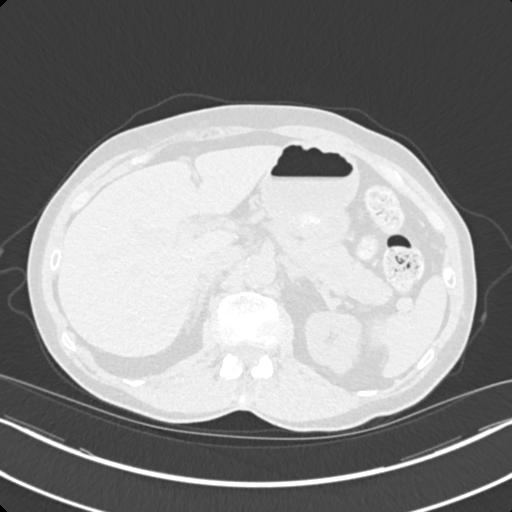
[im 26/165  lung]
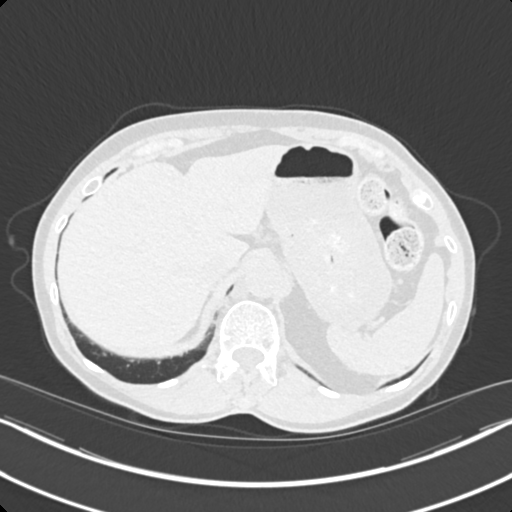
[im 38/165  lung]
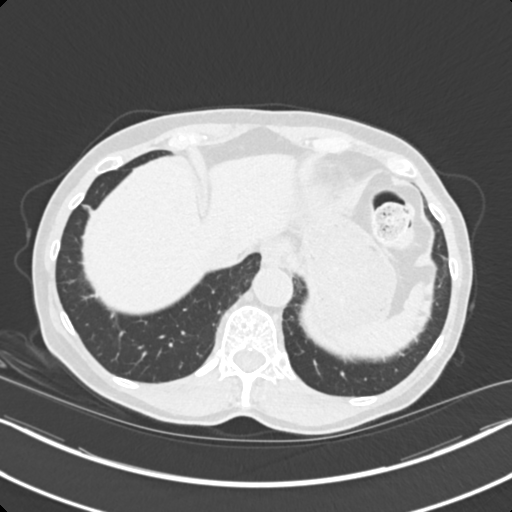
[im 51/165  lung]
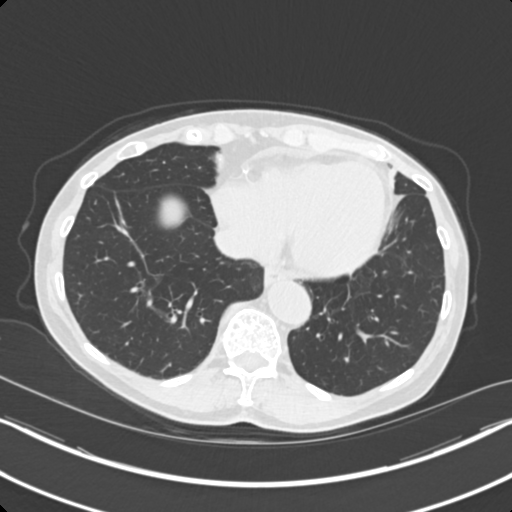
[im 64/165  mediastinal]
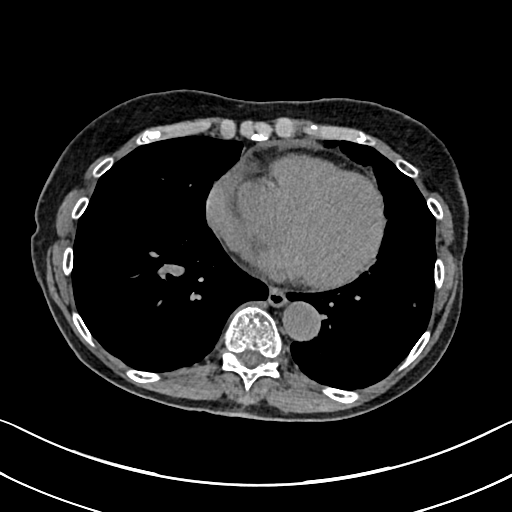
[im 64/165  lung]
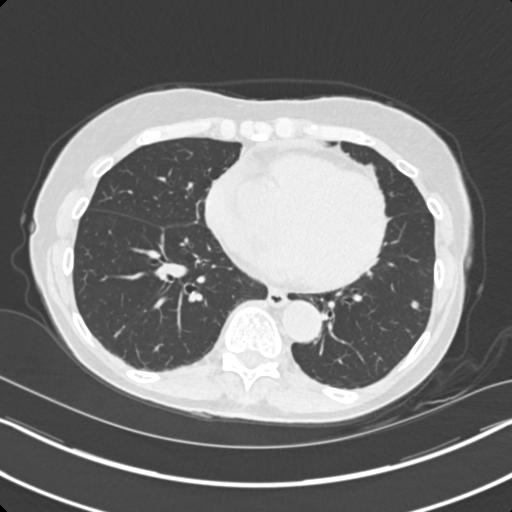
[im 76/165  lung]
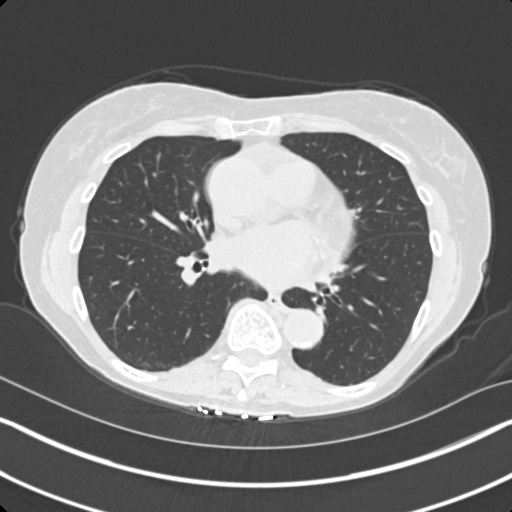
[im 89/165  lung]
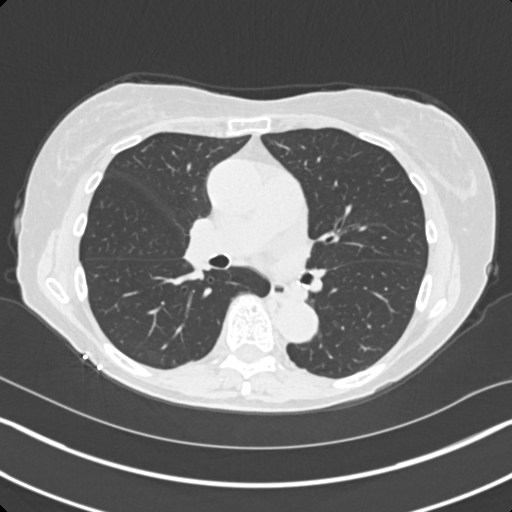
[im 101/165  lung]
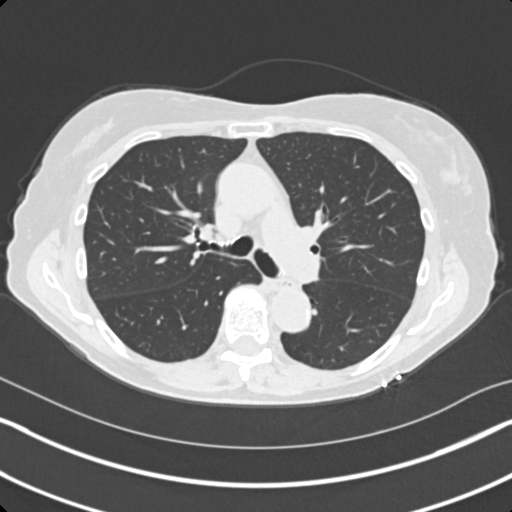
[im 114/165  mediastinal]
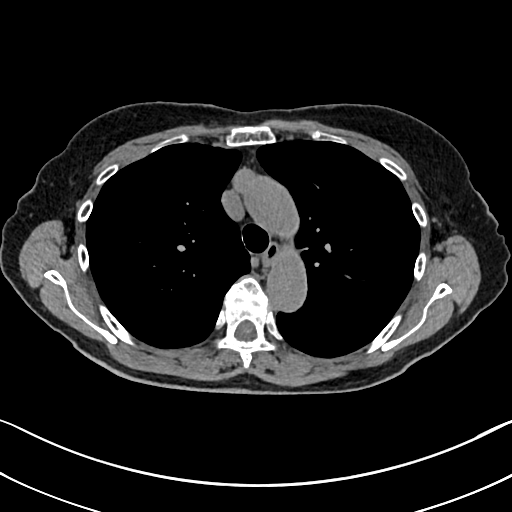
[im 114/165  lung]
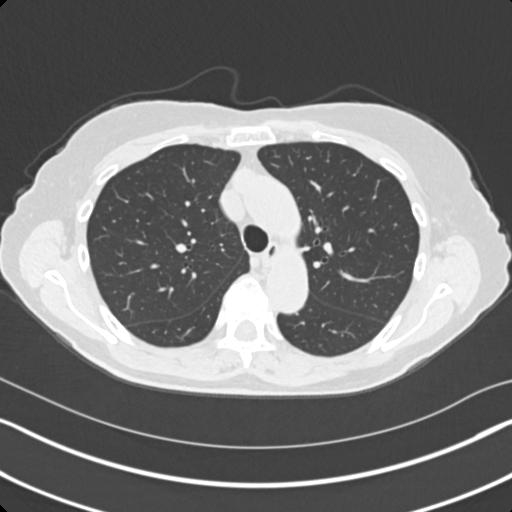
[im 127/165  lung]
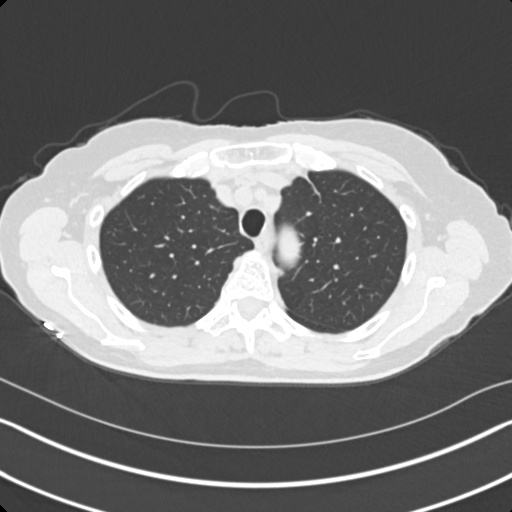
[im 139/165  lung]
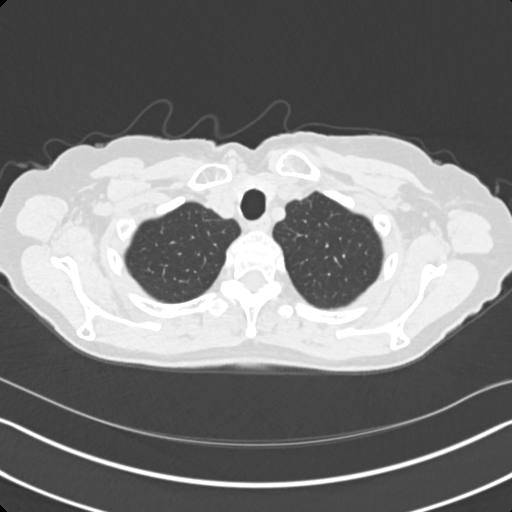
[im 152/165  lung]
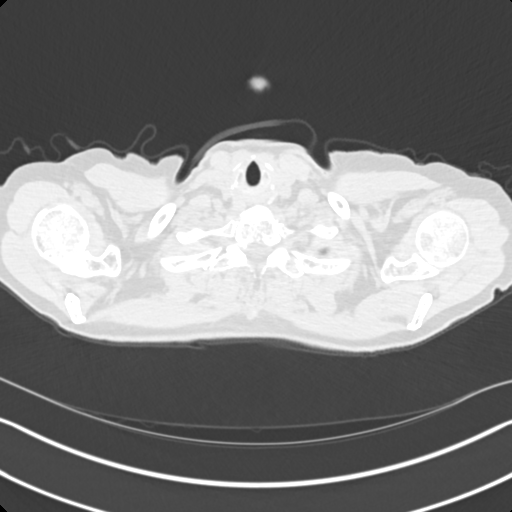

[Series 4: chest 2.00 br40 s3 cor · coronal · 0.65mm/px · 3 of 178 slices shown]
[im 36/178  lung]
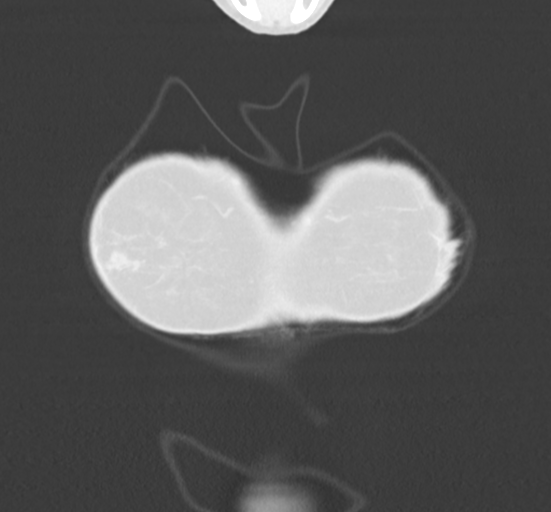
[im 71/178  lung]
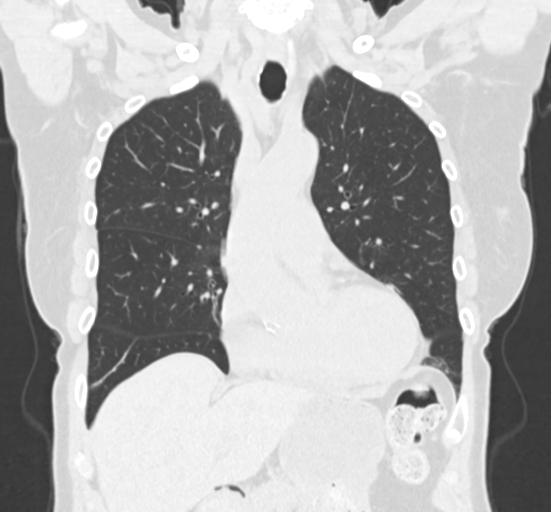
[im 107/178  lung]
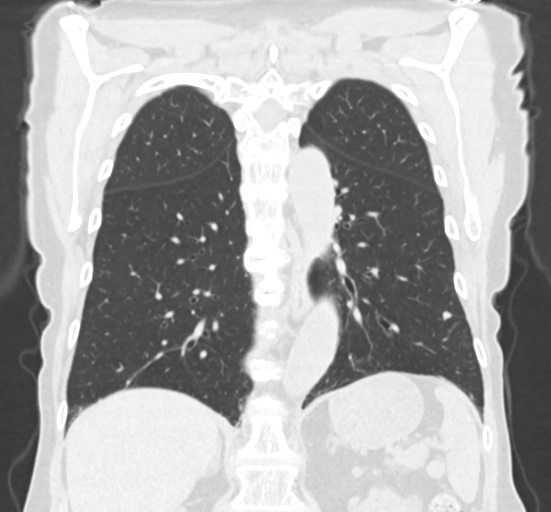

[15 of 36 positions shown; findings below may reference images not displayed]

FINDINGS: Cardiovascular: The heart remains at the upper limits of normal in
size. Unchanged minimal pericardial effusion. The ascending thoracic
aorta is at the upper limits of normal in size, measuring up to
cm, unchanged. Coronary, aortic arch, and branch vessel
atherosclerotic vascular disease.

Mediastinum/Nodes: No enlarged mediastinal or axillary lymph nodes.
Thyroid gland, trachea, and esophagus demonstrate no significant
findings.

Lungs/Pleura: Unchanged 6 mm left lower lobe nodule (series 8, image
103). Unchanged 2 mm left lower lobe nodule (series 8, image 108).
No new pulmonary nodule. Unchanged linear scarring in the right
lower lobe. No focal consolidation, pleural effusion, or
pneumothorax.

Upper Abdomen: No acute abnormality. Unchanged punctate left renal
calculus.

Musculoskeletal: No chest wall mass or suspicious bone lesions
identified.
IMPRESSION: 1. Two left lower lobe nodules measuring up to 6 mm are unchanged.
Non-contrast chest CT at 12-18 months (from today's scan) is
considered optional for low-risk patients, but is recommended for
high-risk patients. This recommendation follows the consensus
statement: Guidelines for Management of Incidental Pulmonary Nodules
Detected on CT Images: From the [HOSPITAL] 8518; Radiology
2.  Aortic atherosclerosis (2NUZR-MD2.2).

## 2019-08-21 ENCOUNTER — Other Ambulatory Visit: Payer: Self-pay | Admitting: Geriatric Medicine

## 2019-08-21 DIAGNOSIS — R911 Solitary pulmonary nodule: Secondary | ICD-10-CM

## 2019-08-25 DIAGNOSIS — R1031 Right lower quadrant pain: Secondary | ICD-10-CM | POA: Diagnosis not present

## 2019-08-26 ENCOUNTER — Ambulatory Visit
Admission: RE | Admit: 2019-08-26 | Discharge: 2019-08-26 | Disposition: A | Discharge status: Home / Self Care | Payer: Medicare Other | Source: Ambulatory Visit | Attending: Geriatric Medicine | Admitting: Geriatric Medicine

## 2019-08-26 DIAGNOSIS — I313 Pericardial effusion (noninflammatory): Secondary | ICD-10-CM | POA: Diagnosis not present

## 2019-08-26 DIAGNOSIS — R911 Solitary pulmonary nodule: Secondary | ICD-10-CM

## 2019-08-26 DIAGNOSIS — I2510 Atherosclerotic heart disease of native coronary artery without angina pectoris: Secondary | ICD-10-CM | POA: Diagnosis not present

## 2019-08-26 DIAGNOSIS — R918 Other nonspecific abnormal finding of lung field: Secondary | ICD-10-CM | POA: Diagnosis not present

## 2019-09-22 ENCOUNTER — Ambulatory Visit
Admission: RE | Admit: 2019-09-22 | Discharge: 2019-09-22 | Disposition: A | Discharge status: Home / Self Care | Payer: Medicare Other | Source: Ambulatory Visit | Attending: Geriatric Medicine | Admitting: Geriatric Medicine

## 2019-09-22 ENCOUNTER — Other Ambulatory Visit: Payer: Self-pay

## 2019-09-22 DIAGNOSIS — Z1231 Encounter for screening mammogram for malignant neoplasm of breast: Secondary | ICD-10-CM | POA: Diagnosis not present

## 2019-09-24 DIAGNOSIS — M810 Age-related osteoporosis without current pathological fracture: Secondary | ICD-10-CM | POA: Diagnosis not present

## 2019-09-24 DIAGNOSIS — E7800 Pure hypercholesterolemia, unspecified: Secondary | ICD-10-CM | POA: Diagnosis not present

## 2019-09-24 DIAGNOSIS — Z0000 Encounter for general adult medical examination without abnormal findings: Secondary | ICD-10-CM | POA: Diagnosis not present

## 2019-09-24 DIAGNOSIS — Z1389 Encounter for screening for other disorder: Secondary | ICD-10-CM | POA: Diagnosis not present

## 2019-09-24 DIAGNOSIS — R202 Paresthesia of skin: Secondary | ICD-10-CM | POA: Diagnosis not present

## 2019-09-24 DIAGNOSIS — F419 Anxiety disorder, unspecified: Secondary | ICD-10-CM | POA: Diagnosis not present

## 2019-09-24 DIAGNOSIS — Z79899 Other long term (current) drug therapy: Secondary | ICD-10-CM | POA: Diagnosis not present

## 2019-10-04 NOTE — Progress Notes (Signed)
Cardiology Office Note   Date:  10/06/2019   ID:  Julia [name], DOB Oct 21, 1936, MRN [id number]  PCP:  Julia Manes, MD    No chief complaint on file.    Wt Readings from Last 3 Encounters:  10/06/19 123 lb (55.8 kg)  10/07/18 126 lb 6.4 oz (57.3 kg)  09/18/17 128 lb 3.2 oz (58.2 kg)       History of Present Illness: Julia [name] is a 83 y.o. female  Who has had a LBBB and mild aortic insufficiency. She had a normal stress test in 2011. Echo in 2016 showed normal LV function with mild AI.  She was limited by torn foot ligaments in 2016. This resolved.  She was given a prescription for losartan/HCTZ 50/12.5 mg but did not want to take it. BP in our office with her cuff correlated to our reading in the officen the past.  BP readings at home have been in the [id number] systolic range, over the last few months.   Known severe LVH by echo.  She declined ACE-I.   2018 echo: - Left ventricle: Abnormal septal motion. The cavity size was mildly dilated. There was severe concentric hypertrophy. Systolic function was normal. The estimated ejection fraction was in the range of 50% to 55%. Wall motion was normal; there were no regional wall motion abnormalities. Doppler parameters are consistent with abnormal left ventricular relaxation (grade 1 diastolic dysfunction). - Aortic valve: There was mild regurgitation. - Mitral valve: There was mild regurgitation. - Atrial septum: No defect or patent foramen ovale was identified.   Prior to Julia [name], she exercised 5x/week at the [facility].  Overall, she feels well.    BP is in the 99991111 mm Hg systolic range; outliers are in the 140 range. Now, She does exercises with a youtube video.  She walks regularly.    In March, had [facility]. thought to be anxiety.  Sx have resolved.  Denies : Chest pain. Dizziness. Leg edema. Nitroglycerin use. Orthopnea. Palpitations. Paroxysmal nocturnal dyspnea. Shortness of breath.  Syncope.   She has some visual issues and will be seeing a neuro-ophthalmologist in [facility], Dr. Jolyn [name].      Past Medical History:  Diagnosis Date  . Aortic insufficiency    a. mild by echo 2016.  Marland Kitchen Chest pain    Nuclear March, 2011, normal, ejection fraction 64%  . Dyslipidemia   . Elevated blood pressure reading   . GERD (gastroesophageal reflux disease)    Patient sometimes has slight right lower jaw discomfort at the time of her reflux  . LBBB (left bundle branch block)   . Pericardial effusion    Echo December, 2009, small, incidental  . Shoulder pain    Left scapular pain, appeared to be radicular    Past Surgical History:  Procedure Laterality Date  . [place] VITRECTOMY WITH 20 GAUGE MVR PORT FOR MACULAR HOLE  2000  . BREAST BIOPSY  1983   benign  . CHOLECYSTECTOMY  1984  . MELANOMA EXCISION  2012, 2013  . NASAL SEPTUM SURGERY  1993  . ROTATOR CUFF REPAIR  2002     Current Outpatient Medications  Medication Sig Dispense Refill  . ALPRAZolam (XANAX) 0.5 MG tablet Take 0.5 mg by mouth as needed for anxiety.     . Cholecalciferol (VITAMIN D3) 2000 UNITS capsule Take 2,000 Units by mouth daily.    . Melatonin 1 MG/ML LIQD Take 3 mLs by mouth at bedtime.     Marland Kitchen  Multiple Vitamin (MULTIVITAMIN) tablet Take 1 tablet by mouth daily.     Marland Kitchen OLIVE LEAF EXTRACT PO Take 1 tablet by mouth 2 (two) times daily. 900mg  each    . Omega-3 Fatty Acids (FISH OIL CONCENTRATE PO) Take 1,600 mg by mouth daily.     Marland Kitchen OVER THE COUNTER MEDICATION Take 1 capsule by mouth daily. ULTIMATE BONE SUPPORT    . Propylene Glycol 0.6 % SOLN Place 1 drop into both eyes as needed.     No current facility-administered medications for this visit.     Allergies:   Pravastatin    Social History:  The patient  reports that she has never smoked. She has never used smokeless tobacco. She reports current alcohol use. She reports that she does not use drugs.   Family History:   The patient's family history includes COPD in her father; Emphysema in her father; Heart murmur in her mother; Pneumonia in her father and mother.    ROS:  Please see the history of present illness.   Otherwise, review of systems are positive for rare increased BP readings, typically in the MDs ofice.   All other systems are reviewed and negative.    PHYSICAL EXAM: VS:  BP 140/70   Pulse 77   Ht 5\' 5"  (1.651 m)   Wt 123 lb (55.8 kg)   SpO2 97%   BMI 20.47 kg/m  , BMI Body mass index is 20.47 kg/m. GEN: Well nourished, well developed, in no acute distress  HEENT: normal  Neck: no JVD, carotid bruits, or masses Cardiac: RRR; no murmurs, rubs, or gallops,no edema  Respiratory:  clear to auscultation bilaterally, normal work of breathing GI: soft, nontender, nondistended, + BS MS: no deformity or atrophy  Skin: warm and dry, no rash Neuro:  Strength and sensation are intact Psych: euthymic mood, full affect   EKG:   The ekg ordered today demonstrates    Recent Labs: 12/31/2018: BUN 8; Creatinine, Ser 0.87; Hemoglobin 15.1; Platelets 386; Potassium 4.0; Sodium 132   Lipid Panel No results found for: CHOL, TRIG, HDL, CHOLHDL, VLDL, LDLCALC, LDLDIRECT   Other studies Reviewed: Additional studies/ records that were reviewed today with results demonstrating: .   ASSESSMENT AND PLAN:  1. AI: No sign of CHF.  No fluid retention. 2. LBBB: chronic.  Noed again in 12/2018. 3. Elevated BP: COntrolled at home for the most part.  4. Hyperlipidemia: The current medical regimen is effective;  continue present plan and medications. 5. Severe LVH: No CHF.  6. Continue COVID 19 precautions.   Current medicines are reviewed at length with the patient today.  The patient concerns regarding her medicines were addressed.  The following changes have been made:  No change  Labs/ tests ordered today include:  No orders of the defined types were placed in this encounter.   Recommend 150  minutes/week of aerobic exercise Low fat, low carb, high fiber diet recommended  Disposition:   FU in 1 year   Signed, Larae Grooms, MD  10/06/2019 11:37 AM    [facility] [place], [place], [place]  [address] Phone: [phone]; Fax: [phone]

## 2019-10-06 ENCOUNTER — Encounter: Payer: Self-pay | Admitting: Interventional Cardiology

## 2019-10-06 ENCOUNTER — Ambulatory Visit (INDEPENDENT_AMBULATORY_CARE_PROVIDER_SITE_OTHER): Payer: Medicare Other | Admitting: Interventional Cardiology

## 2019-10-06 ENCOUNTER — Other Ambulatory Visit: Payer: Self-pay

## 2019-10-06 VITALS — BP 140/70 | HR 77 | Ht 65.0 in | Wt 123.0 lb

## 2019-10-06 DIAGNOSIS — E785 Hyperlipidemia, unspecified: Secondary | ICD-10-CM | POA: Diagnosis not present

## 2019-10-06 DIAGNOSIS — I447 Left bundle-branch block, unspecified: Secondary | ICD-10-CM | POA: Diagnosis not present

## 2019-10-06 DIAGNOSIS — I517 Cardiomegaly: Secondary | ICD-10-CM

## 2019-10-06 DIAGNOSIS — I351 Nonrheumatic aortic (valve) insufficiency: Secondary | ICD-10-CM

## 2019-10-06 DIAGNOSIS — R030 Elevated blood-pressure reading, without diagnosis of hypertension: Secondary | ICD-10-CM

## 2019-10-06 NOTE — Patient Instructions (Signed)

## 2019-10-09 ENCOUNTER — Ambulatory Visit: Payer: Medicare Other | Admitting: Interventional Cardiology

## 2019-10-20 DIAGNOSIS — R002 Palpitations: Secondary | ICD-10-CM

## 2019-10-22 ENCOUNTER — Telehealth: Payer: Self-pay | Admitting: Radiology

## 2019-10-22 ENCOUNTER — Telehealth: Payer: Self-pay | Admitting: Interventional Cardiology

## 2019-10-22 NOTE — Telephone Encounter (Signed)
New message:     Patient calling stating she forgot to ask a question concering DVT.

## 2019-10-22 NOTE — Telephone Encounter (Signed)
Enrolled patient for a 14 day Zio monitor to be mailed to patients home.  

## 2019-10-22 NOTE — Telephone Encounter (Signed)
Sp[oke with pt.  She reports having an intermittent pain in one leg the past couple days.  The pain does not last long, she describes it as a muscle cramp.  Pt concerned that she has a DVT.  Pt does not have history of DVT, denies redness, warmth or swelling in the leg.  Pt able to walk without difficulty.  Most likely muscle cramp, pt v/u of s/s to monitor and notify if any changes.

## 2019-10-27 ENCOUNTER — Other Ambulatory Visit (INDEPENDENT_AMBULATORY_CARE_PROVIDER_SITE_OTHER): Payer: Medicare Other

## 2019-10-27 DIAGNOSIS — R002 Palpitations: Secondary | ICD-10-CM

## 2019-11-19 DIAGNOSIS — R002 Palpitations: Secondary | ICD-10-CM | POA: Diagnosis not present

## 2019-11-20 ENCOUNTER — Ambulatory Visit: Payer: Medicare Other | Attending: Internal Medicine

## 2019-11-20 DIAGNOSIS — Z23 Encounter for immunization: Secondary | ICD-10-CM

## 2019-11-20 NOTE — Progress Notes (Signed)
   Covid-19 Vaccination Clinic  Name:  Julia [name]    MRN: [id number] DOB: 1936-06-26  11/20/2019  [name] was observed post Covid-19 immunization for 15 minutes without incidence. She was provided with Vaccine Information Sheet and instruction to access the V-Safe system.   [name] was instructed to call 911 with any severe reactions post vaccine: Marland Kitchen Difficulty breathing  . Swelling of your face and throat  . A fast heartbeat  . A bad rash all over your body  . Dizziness and weakness    Immunizations Administered    Name Date Dose VIS Date Route   Pfizer COVID-19 Vaccine 11/20/2019  9:09 AM 0.3 mL 10/10/2019 Intramuscular   Manufacturer: [employer]   Lot: [id number]   [place]: [id number]

## 2019-11-21 ENCOUNTER — Other Ambulatory Visit: Payer: Self-pay

## 2019-11-21 MED ORDER — METOPROLOL TARTRATE 25 MG PO TABS: 25.0000 mg | ORAL_TABLET | Freq: Two times a day (BID) | ORAL | 3 refills | Status: DC

## 2019-11-24 ENCOUNTER — Telehealth: Payer: Self-pay

## 2019-11-24 NOTE — Telephone Encounter (Signed)
I spoke to the patient and reviewed Metoprolol Tartrate instructions.  She will continue to monitor HR/BP.

## 2019-11-25 ENCOUNTER — Telehealth: Payer: Self-pay | Admitting: Interventional Cardiology

## 2019-11-25 MED ORDER — METOPROLOL TARTRATE 25 MG PO TABS: 12.5000 mg | ORAL_TABLET | Freq: Two times a day (BID) | ORAL | 3 refills | Status: DC

## 2019-11-25 NOTE — Telephone Encounter (Signed)
Called and made patient aware of recommendations to decrease metoprolol to 12.5 mg BID. Patient verbalized understanding and thanked me for the call.

## 2019-11-25 NOTE — Telephone Encounter (Signed)
Pt c/o medication issue:  1. Name of Medication:  Metoprolol   2. How are you currently taking this medication (dosage and times per day)?  2 times a day  3. Are you having a reaction (difficulty breathing--STAT)?a little at times  4. What is your medication issue?  Tired and not sleep, lightheaded, a little short of breath, can feel her heart beating hard

## 2019-11-25 NOTE — Telephone Encounter (Signed)
Returned call to patient. She states that since she started the metoprolol she has not been sleeping and had an episode of of SOB (at rest and with activity) and felt lightheaded with position changes, but not dizzy. She was started on metoprolol 25 mg BID for SVT seen on monitor. She states that BP and HR before metoprolol were 116/71 HR- 88 and 125/79 HR 76 and after starting the metoprolol she has been 107/73 HR 69 and 115/75 HR 56. She denies excessive caffeine or alcohol and has bee taking melatonin to help with sleep. She has been staying well hydrated. Patient does admit to having anxiety. Denies having any Sx at this time. Will forward to Dr. Irish Lack for review.

## 2019-11-25 NOTE — Telephone Encounter (Signed)
Can decrease metoprolol to 12.5 mg BID.  JV

## 2019-11-27 ENCOUNTER — Telehealth: Payer: Self-pay

## 2019-11-27 NOTE — Telephone Encounter (Signed)
I spoke to the patient and gave her information about her monitor results.  She verbalized understanding.

## 2019-11-30 NOTE — Progress Notes (Signed)
Virtual Visit via Telephone Note   This visit type was conducted due to national recommendations for restrictions regarding the COVID-19 Pandemic (e.g. social distancing) in an effort to limit this patient's exposure and mitigate transmission in our community.  Due to her co-morbid illnesses, this patient is at least at moderate risk for complications without adequate follow up.  This format is felt to be most appropriate for this patient at this time.  The patient did not have access to video technology/had technical difficulties with video requiring transitioning to audio format only (telephone).  All issues noted in this document were discussed and addressed.  No physical exam could be performed with this format.  Please refer to the patient's chart for her  consent to telehealth for [facility].   Date:  12/02/2019   ID:  Julia [name], DOB June 25, 1936, MRN [id number]  Patient Location: Home Provider Location: Office  PCP:  Julia Manes, MD  Cardiologist:  Julia Grooms, MD  Electrophysiologist:  None   Evaluation Performed:  Follow-Up Visit  Chief Complaint:  SVT  History of Present Illness:    Julia [name] is a 84 y.o. female with Who has had a LBBB and mild aortic insufficiency. She had a normal stress test in 2011. Echo in 2016 showed normal LV function with mild AI.  She was limited by torn foot ligamentsin 2016. This resolved.  She was given a prescription for losartan/HCTZ 50/12.5 mg but did not want to take it. BP in our office with her cuff correlated to our reading in the officen the past.  BP readings at home have been in the [id number] systolic range, at that time.  Known severe LVH by echo. She declined ACE-I.   2018 echo: - Left ventricle: Abnormal septal motion. The cavity size was mildly dilated. There was severe concentric hypertrophy. Systolic function was normal. The estimated ejection fraction was in the range of 50% to 55%. Wall  motion was normal; there were no regional wall motion abnormalities. Doppler parameters are consistent with abnormal left ventricular relaxation (grade 1 diastolic dysfunction). - Aortic valve: There was mild regurgitation. - Mitral valve: There was mild regurgitation. - Atrial septum: No defect or patent foramen ovale was identified.   Prior to [place], she exercised 5x/week at the [facility]. Overall, she feels well.   BP is in the 99991111 mm Hg systolic range; outliers are in the 140 range. Now, She does exercises with a youtube video.  She walks regularly.    In March, had [facility]. thought to be anxiety.  Sx resolved.  She has some visual issues and will be seeing a neuro-ophthalmologist in [facility], Julia [name].     Holter monitor was done in early 2021 showing: "Normal sinus rhythm.  Intermittent SVT, HR up to 179 bpm, lasting two two minutes, 30 seconds.   Start metoprolol 25 mg BID."  She felt poorly on the 25 mg of metoprolol.  This was decreased to 12.5 mg p.o. twice daily.  She presents for follow-up today.  The patient does not have symptoms concerning for COVID-19 infection (fever, chills, cough, or new shortness of breath).   She feels better with the lower dose of metoprolol.  She would prefer to take this as needed.   Past Medical History:  Diagnosis Date  . Aortic insufficiency    a. mild by echo 2016.  Marland Kitchen Chest pain    Nuclear March, 2011, normal, ejection fraction 64%  . Dyslipidemia   .  Elevated blood pressure reading   . GERD (gastroesophageal reflux disease)    Patient sometimes has slight right lower jaw discomfort at the time of her reflux  . LBBB (left bundle branch block)   . Pericardial effusion    Echo December, 2009, small, incidental  . Shoulder pain    Left scapular pain, appeared to be radicular   Past Surgical History:  Procedure Laterality Date  . [place] VITRECTOMY WITH 20 GAUGE MVR PORT FOR MACULAR HOLE  2000   . BREAST BIOPSY  1983   benign  . CHOLECYSTECTOMY  1984  . MELANOMA EXCISION  2012, 2013  . NASAL SEPTUM SURGERY  1993  . ROTATOR CUFF REPAIR  2002     Current Meds  Medication Sig  . acetaminophen (TYLENOL) 500 MG tablet Take 500 mg by mouth as needed.  . ALPRAZolam (XANAX) 0.5 MG tablet Take 0.5 mg by mouth as needed for anxiety.   . Cholecalciferol (VITAMIN D3) 2000 UNITS capsule Take 2,000 Units by mouth daily.  . famotidine (PEPCID) 20 MG tablet Take 20 mg by mouth as needed for heartburn or indigestion.  . Melatonin 1 MG/ML LIQD Take 3 mLs by mouth at bedtime as needed.   . metoprolol tartrate (LOPRESSOR) 25 MG tablet Take 0.5 tablets (12.5 mg total) by mouth 2 (two) times daily.  . Multiple Vitamin (MULTIVITAMIN) tablet Take 1 tablet by mouth daily.   Marland Kitchen OLIVE LEAF EXTRACT PO Take 1 tablet by mouth 2 (two) times daily. 900mg  each  . Omega-3 Fatty Acids (FISH OIL CONCENTRATE PO) Take 1,600 mg by mouth daily.   Marland Kitchen OVER THE COUNTER MEDICATION Take 1 capsule by mouth daily. STRONTIUM CITRATE ULTIMATE BONE SUPPORT  . Probiotic Product (PROBIOTIC PO) Take 1 tablet by mouth daily.  Marland Kitchen Propylene Glycol 0.6 % SOLN Place 1 drop into both eyes as needed.  . THEANINE PO Takes at bedtime as needed for sleep or stress     Allergies:   Pravastatin   Social History   Tobacco Use  . Smoking status: Never Smoker  . Smokeless tobacco: Never Used  Substance Use Topics  . Alcohol use: Yes  . Drug use: No     Family Hx: The patient's family history includes COPD in her father; Emphysema in her father; Heart murmur in her mother; Pneumonia in her father and mother. There is no history of Heart attack, Hypertension, or Stroke.  ROS:   Please see the history of present illness.    No palpitations All other systems reviewed and are negative.   Prior CV studies:   The following studies were reviewed today:  Monitor results reviewed with the patient  Labs/Other Tests and Data Reviewed:     EKG:  01/01/2019- NSR, LBBB  Recent Labs: 12/31/2018: BUN 8; Creatinine, Ser 0.87; Hemoglobin 15.1; Platelets 386; Potassium 4.0; Sodium 132   Recent Lipid Panel No results found for: CHOL, TRIG, HDL, CHOLHDL, LDLCALC, LDLDIRECT  Wt Readings from Last 3 Encounters:  12/02/19 123 lb (55.8 kg)  10/06/19 123 lb (55.8 kg)  10/07/18 126 lb 6.4 oz (57.3 kg)     Objective:    Vital Signs:  Ht 5' 5.5" (1.664 m)   Wt 123 lb (55.8 kg)   BMI 20.16 kg/m    VITAL SIGNS:  reviewed GEN:  no acute distress RESPIRATORY:  no shortness of breath NEURO:  alert and oriented x 3, no obvious focal deficit PSYCH:  normal affect exam limited by phone format  ASSESSMENT & PLAN:    1. Aortic insufficiency: She has not had CHF or fluid retention.  Plan for echo if any change in symptoms. 2. SVT: She did not tolerate metoprolol 25 mg p.o. twice daily.  Based on her monitor, she had 1 run of SVT lasting over 2 minutes.  Known left bundle branch block. Has some bradycardia, typically in the early AM.  No dizziness or syncope.  Will change to metoprolol 12.5 mg PO BID prn palpitations.  No recent sx.  3. Hyperlipidemia: The current medical regimen is effective;  continue present plan and medications. 4. Severe LVH: No signs of heart failure.  COVID-19 Education: The signs and symptoms of COVID-19 were discussed with the patient and how to seek care for testing (follow up with PCP or arrange E-visit).  The importance of social distancing was discussed today.  Time:   Today, I have spent 20 minutes with the patient with telehealth technology discussing the above problems.     Medication Adjustments/Labs and Tests Ordered: Current medicines are reviewed at length with the patient today.  Concerns regarding medicines are outlined above.   Tests Ordered: No orders of the defined types were placed in this encounter.   Medication Changes: No orders of the defined types were placed in this encounter.    Follow Up:  Either In Person or Virtual in 1 year(s)  Signed, Julia Grooms, MD  12/02/2019 12:18 PM    [place]

## 2019-12-02 ENCOUNTER — Telehealth (INDEPENDENT_AMBULATORY_CARE_PROVIDER_SITE_OTHER): Payer: Medicare Other | Admitting: Interventional Cardiology

## 2019-12-02 ENCOUNTER — Other Ambulatory Visit: Payer: Self-pay

## 2019-12-02 ENCOUNTER — Telehealth: Payer: Self-pay | Admitting: *Deleted

## 2019-12-02 ENCOUNTER — Encounter: Payer: Self-pay | Admitting: Interventional Cardiology

## 2019-12-02 VITALS — Ht 65.5 in | Wt 123.0 lb

## 2019-12-02 DIAGNOSIS — E785 Hyperlipidemia, unspecified: Secondary | ICD-10-CM | POA: Diagnosis not present

## 2019-12-02 DIAGNOSIS — I517 Cardiomegaly: Secondary | ICD-10-CM

## 2019-12-02 DIAGNOSIS — I447 Left bundle-branch block, unspecified: Secondary | ICD-10-CM

## 2019-12-02 DIAGNOSIS — I351 Nonrheumatic aortic (valve) insufficiency: Secondary | ICD-10-CM | POA: Diagnosis not present

## 2019-12-02 DIAGNOSIS — I471 Supraventricular tachycardia: Secondary | ICD-10-CM | POA: Diagnosis not present

## 2019-12-02 MED ORDER — METOPROLOL TARTRATE 25 MG PO TABS: 12.5000 mg | ORAL_TABLET | Freq: Two times a day (BID) | ORAL | 3 refills | Status: DC | PRN

## 2019-12-02 NOTE — Patient Instructions (Signed)
Medication Instructions:  Your physician has recommended you make the following change in your medication:   Take metoprolol tartrate (lopressor) 25 mg tablet: Take 1/2 tablet by mouth twice a day AS NEEDED for palpitations  *If you need a refill on your cardiac medications before your next appointment, please call your pharmacy*  Lab Work: None ordered  If you have labs (blood work) drawn today and your tests are completely normal, you will receive your results only by: Marland Kitchen MyChart Message (if you have MyChart) OR . A paper copy in the mail If you have any lab test that is abnormal or we need to change your treatment, we will call you to review the results.  Testing/Procedures: None ordered  Follow-Up: At [facility], you and your health needs are our priority.  As part of our continuing mission to provide you with exceptional heart care, we have created designated Provider Care Teams.  These Care Teams include your primary Cardiologist (physician) and Advanced Practice Providers (APPs -  Physician Assistants and Nurse Practitioners) who all work together to provide you with the care you need, when you need it.  Your next appointment:   12 month(s)  The format for your next appointment:   In Person  Provider:   You may see Larae Grooms, MD or one of the following Advanced Practice Providers on your designated Care Team:    Melina Copa, PA-C  Ermalinda Barrios, PA-C   Other Instructions

## 2019-12-02 NOTE — Telephone Encounter (Signed)
..   Virtual Visit Pre-Appointment Phone Call  "(Name), I am calling you today to discuss your upcoming appointment. We are currently trying to limit exposure to the virus that causes COVID-19 by seeing patients at home rather than in the office."  1. "What is the BEST phone number to call the day of the visit?" - include this in appointment notes  2. "Do you have or have access to (through a family member/friend) a smartphone with video capability that we can use for your visit?" a. If yes - list this number in appt notes as "cell" (if different from BEST phone #) and list the appointment type as a VIDEO visit in appointment notes b. If no - list the appointment type as a PHONE visit in appointment notes  Confirm consent - "In the setting of the current Covid19 crisis, you are scheduled for a (phone or video) visit with your provider on (date) at (time).  Just as we do with many in-office visits, in order for you to participate in this visit, we must obtain consent.  If you'd like, I can send this to your mychart (if signed up) or email for you to review.  Otherwise, I can obtain your verbal consent now.  All virtual visits are billed to your insurance company just like a normal visit would be.  By agreeing to a virtual visit, we'd like you to understand that the technology does not allow for your provider to perform an examination, and thus may limit your provider's ability to fully assess your condition. If your provider identifies any concerns that need to be evaluated in person, we will make arrangements to do so.  Finally, though the technology is pretty good, we cannot assure that it will always work on either your or our end, and in the setting of a video visit, we may have to convert it to a phone-only visit.  In either situation, we cannot ensure that we have a secure connection.  Are you willing to proceed?" STAFF: Did the patient verbally acknowledge consent to telehealth visit? Document  YES/NO here: YES 3. Advise patient to be prepared - "Two hours prior to your appointment, go ahead and check your blood pressure, pulse, oxygen saturation, and your weight (if you have the equipment to check those) and write them all down. When your visit starts, your provider will ask you for this information. If you have an Apple Watch or Kardia device, please plan to have heart rate information ready on the day of your appointment. Please have a pen and paper handy nearby the day of the visit as well."  4. Give patient instructions for MyChart download to smartphone OR Doximity/Doxy.me as below if video visit (depending on what platform provider is using)  5. Inform patient they will receive a phone call 15 minutes prior to their appointment time (may be from unknown caller ID) so they should be prepared to answer    TELEPHONE CALL NOTE  Julia [name] has been deemed a candidate for a follow-up tele-health visit to limit community exposure during the Covid-19 pandemic. I spoke with the patient via phone to ensure availability of phone/video source, confirm preferred email & phone number, and discuss instructions and expectations.  I reminded Julia [name] to be prepared with any vital sign and/or heart rhythm information that could potentially be obtained via home monitoring, at the time of her visit. I reminded Julia [name] to expect a phone call prior to her  visit.  [name], CMA 12/02/2019 11:49 AM   INSTRUCTIONS FOR DOWNLOADING THE MYCHART APP TO SMARTPHONE  - The patient must first make sure to have activated MyChart and know their login information - If Apple, go to [employer] and type in MyChart in the search bar and download the app. If Android, ask patient to go to [employer] and type in [place] in the search bar and download the app. The app is free but as with any other app downloads, their phone may require them to verify saved payment information or Apple/Android  password.  - The patient will need to then log into the app with their MyChart username and password, and select [facility] as their healthcare provider to link the account. When it is time for your visit, go to the MyChart app, find appointments, and click Begin Video Visit. Be sure to Select Allow for your device to access the Microphone and Camera for your visit. You will then be connected, and your provider will be with you shortly.  **If they have any issues connecting, or need assistance please contact MyChart service desk [phone])**  **If using a computer, in order to ensure the best quality for their visit they will need to use either of the following Internet Browsers: [employer], or Google Chrome**  IF USING DOXIMITY or DOXY.ME - The patient will receive a link just prior to their visit by text.     FULL LENGTH CONSENT FOR TELE-HEALTH VISIT   I hereby voluntarily request, consent and authorize [facility] and its employed or contracted physicians, physician assistants, nurse practitioners or other licensed health care professionals (the Practitioner), to provide me with telemedicine health care services (the "Services") as deemed necessary by the treating Practitioner. I acknowledge and consent to receive the Services by the Practitioner via telemedicine. I understand that the telemedicine visit will involve communicating with the Practitioner through live audiovisual communication technology and the disclosure of certain medical information by electronic transmission. I acknowledge that I have been given the opportunity to request an in-person assessment or other available alternative prior to the telemedicine visit and am voluntarily participating in the telemedicine visit.  I understand that I have the right to withhold or withdraw my consent to the use of telemedicine in the course of my care at any time, without affecting my right to future care or treatment,  and that the Practitioner or I may terminate the telemedicine visit at any time. I understand that I have the right to inspect all information obtained and/or recorded in the course of the telemedicine visit and may receive copies of available information for a reasonable fee.  I understand that some of the potential risks of receiving the Services via telemedicine include:  Marland Kitchen Delay or interruption in medical evaluation due to technological equipment failure or disruption; . Information transmitted may not be sufficient (e.g. poor resolution of images) to allow for appropriate medical decision making by the Practitioner; and/or  . In rare instances, security protocols could fail, causing a breach of personal health information.  Furthermore, I acknowledge that it is my responsibility to provide information about my medical history, conditions and care that is complete and accurate to the best of my ability. I acknowledge that Practitioner's advice, recommendations, and/or decision may be based on factors not within their control, such as incomplete or inaccurate data provided by me or distortions of diagnostic images or specimens that may result from electronic transmissions. I  understand that the practice of medicine is not an exact science and that Practitioner makes no warranties or guarantees regarding treatment outcomes. I acknowledge that I will receive a copy of this consent concurrently upon execution via email to the email address I last provided but may also request a printed copy by calling the office of [facility].    I understand that my insurance will be billed for this visit.   I have read or had this consent read to me. . I understand the contents of this consent, which adequately explains the benefits and risks of the Services being provided via telemedicine.  . I have been provided ample opportunity to ask questions regarding this consent and the Services and have had my questions  answered to my satisfaction. . I give my informed consent for the services to be provided through the use of telemedicine in my medical care  By participating in this telemedicine visit I agree to the above.

## 2019-12-11 ENCOUNTER — Ambulatory Visit: Payer: Medicare Other | Attending: Internal Medicine

## 2019-12-11 DIAGNOSIS — Z23 Encounter for immunization: Secondary | ICD-10-CM | POA: Insufficient documentation

## 2019-12-11 NOTE — Progress Notes (Signed)
   Covid-19 Vaccination Clinic  Name:  Julia [name]    MRN: [id number] DOB: Nov 19, 1935  12/11/2019  [name] was observed post Covid-19 immunization for 15 minutes without incidence. She was provided with Vaccine Information Sheet and instruction to access the V-Safe system.   [name] was instructed to call 911 with any severe reactions post vaccine: Marland Kitchen Difficulty breathing  . Swelling of your face and throat  . A fast heartbeat  . A bad rash all over your body  . Dizziness and weakness    Immunizations Administered    Name Date Dose VIS Date Route   Pfizer COVID-19 Vaccine 12/11/2019  8:39 AM 0.3 mL 10/10/2019 Intramuscular   Manufacturer: [place]   Lot: [id number]   [place]: [id number]

## 2019-12-23 DIAGNOSIS — H35341 Macular cyst, hole, or pseudohole, right eye: Secondary | ICD-10-CM | POA: Diagnosis not present

## 2019-12-23 DIAGNOSIS — H33312 Horseshoe tear of retina without detachment, left eye: Secondary | ICD-10-CM | POA: Diagnosis not present

## 2019-12-23 DIAGNOSIS — H43812 Vitreous degeneration, left eye: Secondary | ICD-10-CM | POA: Diagnosis not present

## 2019-12-23 DIAGNOSIS — G43109 Migraine with aura, not intractable, without status migrainosus: Secondary | ICD-10-CM | POA: Diagnosis not present

## 2019-12-23 DIAGNOSIS — Z961 Presence of intraocular lens: Secondary | ICD-10-CM | POA: Diagnosis not present

## 2020-01-21 DIAGNOSIS — H4051X3 Glaucoma secondary to other eye disorders, right eye, severe stage: Secondary | ICD-10-CM | POA: Diagnosis not present

## 2020-01-28 DIAGNOSIS — Z6820 Body mass index (BMI) 20.0-20.9, adult: Secondary | ICD-10-CM | POA: Diagnosis not present

## 2020-01-28 DIAGNOSIS — Z01419 Encounter for gynecological examination (general) (routine) without abnormal findings: Secondary | ICD-10-CM | POA: Diagnosis not present

## 2020-02-03 DIAGNOSIS — H6123 Impacted cerumen, bilateral: Secondary | ICD-10-CM | POA: Diagnosis not present

## 2020-02-09 DIAGNOSIS — H4051X3 Glaucoma secondary to other eye disorders, right eye, severe stage: Secondary | ICD-10-CM | POA: Diagnosis not present

## 2020-02-11 DIAGNOSIS — D1801 Hemangioma of skin and subcutaneous tissue: Secondary | ICD-10-CM | POA: Diagnosis not present

## 2020-02-11 DIAGNOSIS — D2272 Melanocytic nevi of left lower limb, including hip: Secondary | ICD-10-CM | POA: Diagnosis not present

## 2020-02-11 DIAGNOSIS — L720 Epidermal cyst: Secondary | ICD-10-CM | POA: Diagnosis not present

## 2020-02-11 DIAGNOSIS — L853 Xerosis cutis: Secondary | ICD-10-CM | POA: Diagnosis not present

## 2020-02-11 DIAGNOSIS — Z85820 Personal history of malignant melanoma of skin: Secondary | ICD-10-CM | POA: Diagnosis not present

## 2020-02-11 DIAGNOSIS — D2262 Melanocytic nevi of left upper limb, including shoulder: Secondary | ICD-10-CM | POA: Diagnosis not present

## 2020-02-11 DIAGNOSIS — L821 Other seborrheic keratosis: Secondary | ICD-10-CM | POA: Diagnosis not present

## 2020-02-11 DIAGNOSIS — Z85828 Personal history of other malignant neoplasm of skin: Secondary | ICD-10-CM | POA: Diagnosis not present

## 2020-03-09 DIAGNOSIS — M8588 Other specified disorders of bone density and structure, other site: Secondary | ICD-10-CM | POA: Diagnosis not present

## 2020-03-09 DIAGNOSIS — M810 Age-related osteoporosis without current pathological fracture: Secondary | ICD-10-CM | POA: Diagnosis not present

## 2020-04-05 ENCOUNTER — Telehealth: Payer: Self-pay | Admitting: Interventional Cardiology

## 2020-04-05 NOTE — Telephone Encounter (Signed)
Called and spoke to patient. She states that she was was woken out of her sleep at 2:30 AM this morning with pain in the center of her chest. She states that the pain only lasted 30 seconds and went away on its own. Denies SOB, N/V, lightheadedness, dizziness, palpitations, or any other Sx. Denies having any complaints at this time. Instructed the patient to continue to monitor and let us know if her Sx return. She verbalized understanding and thanked me for the call.

## 2020-04-05 NOTE — Telephone Encounter (Signed)
Pt c/o of Chest Pain: STAT if CP now or developed within 24 hours  1. Are you having CP right now? Not right now- woke up in the night with chest pain in the center of her chest    2. Are you experiencing any other symptoms (ex. SOB, nausea, vomiting, sweating)? no  3. How long have you been experiencing CP? Last night  4. Is your CP continuous or coming and going? Happen only last night  5. Have you taken Nitroglycerin? No-pt would like to be seen by somebody today please?

## 2020-04-07 NOTE — Progress Notes (Signed)
Stopped by [employer] office at [facility] with concerns over elevated heart rate while walking last pm (outside) highest HR of 160. Higher than her heart rate should be for vigorous exercise.  Returned to 70 with rest. Has hx of SVT and was on metoprolol in the past but not well tolerated. Wore HR monitor in December. No syncope, sob or cp during high HR.  Had chest pain Sunday night (early morning) and reported it to MD office.  Next office visit not until Fall. Encouraged to reach back to MD office via [place]. May want eval and/or move office visit up.

## 2020-05-20 DIAGNOSIS — H4051X3 Glaucoma secondary to other eye disorders, right eye, severe stage: Secondary | ICD-10-CM | POA: Diagnosis not present

## 2020-07-09 DIAGNOSIS — Z20822 Contact with and (suspected) exposure to covid-19: Secondary | ICD-10-CM | POA: Diagnosis not present

## 2020-07-09 DIAGNOSIS — J069 Acute upper respiratory infection, unspecified: Secondary | ICD-10-CM | POA: Diagnosis not present

## 2020-07-26 DIAGNOSIS — H4051X3 Glaucoma secondary to other eye disorders, right eye, severe stage: Secondary | ICD-10-CM | POA: Diagnosis not present

## 2020-07-29 DIAGNOSIS — Z23 Encounter for immunization: Secondary | ICD-10-CM | POA: Diagnosis not present

## 2020-08-11 ENCOUNTER — Other Ambulatory Visit: Payer: Self-pay | Admitting: Geriatric Medicine

## 2020-08-11 DIAGNOSIS — Z1231 Encounter for screening mammogram for malignant neoplasm of breast: Secondary | ICD-10-CM

## 2020-08-12 DIAGNOSIS — Z23 Encounter for immunization: Secondary | ICD-10-CM | POA: Diagnosis not present

## 2020-08-18 DIAGNOSIS — H6123 Impacted cerumen, bilateral: Secondary | ICD-10-CM | POA: Diagnosis not present

## 2020-08-25 ENCOUNTER — Other Ambulatory Visit: Payer: Self-pay | Admitting: Geriatric Medicine

## 2020-08-25 DIAGNOSIS — R911 Solitary pulmonary nodule: Secondary | ICD-10-CM

## 2020-09-07 ENCOUNTER — Other Ambulatory Visit: Payer: Medicare Other

## 2020-09-08 ENCOUNTER — Other Ambulatory Visit: Payer: Self-pay

## 2020-09-08 ENCOUNTER — Ambulatory Visit
Admission: RE | Admit: 2020-09-08 | Discharge: 2020-09-08 | Disposition: A | Discharge status: Home / Self Care | Payer: Medicare Other | Source: Ambulatory Visit | Attending: Geriatric Medicine | Admitting: Geriatric Medicine

## 2020-09-08 DIAGNOSIS — J984 Other disorders of lung: Secondary | ICD-10-CM | POA: Diagnosis not present

## 2020-09-08 DIAGNOSIS — I700 Atherosclerosis of aorta: Secondary | ICD-10-CM | POA: Diagnosis not present

## 2020-09-08 DIAGNOSIS — R911 Solitary pulmonary nodule: Secondary | ICD-10-CM

## 2020-09-08 DIAGNOSIS — I2510 Atherosclerotic heart disease of native coronary artery without angina pectoris: Secondary | ICD-10-CM | POA: Diagnosis not present

## 2020-09-08 DIAGNOSIS — I517 Cardiomegaly: Secondary | ICD-10-CM | POA: Diagnosis not present

## 2020-09-27 DIAGNOSIS — H4051X3 Glaucoma secondary to other eye disorders, right eye, severe stage: Secondary | ICD-10-CM | POA: Diagnosis not present

## 2020-09-28 ENCOUNTER — Other Ambulatory Visit: Payer: Self-pay

## 2020-09-28 ENCOUNTER — Ambulatory Visit
Admission: RE | Admit: 2020-09-28 | Discharge: 2020-09-28 | Disposition: A | Discharge status: Home / Self Care | Payer: Medicare Other | Source: Ambulatory Visit | Attending: Geriatric Medicine | Admitting: Geriatric Medicine

## 2020-09-28 DIAGNOSIS — Z1231 Encounter for screening mammogram for malignant neoplasm of breast: Secondary | ICD-10-CM | POA: Diagnosis not present

## 2020-09-29 DIAGNOSIS — I272 Pulmonary hypertension, unspecified: Secondary | ICD-10-CM | POA: Diagnosis not present

## 2020-09-29 DIAGNOSIS — M810 Age-related osteoporosis without current pathological fracture: Secondary | ICD-10-CM | POA: Diagnosis not present

## 2020-09-29 DIAGNOSIS — K219 Gastro-esophageal reflux disease without esophagitis: Secondary | ICD-10-CM | POA: Diagnosis not present

## 2020-09-29 DIAGNOSIS — Z1389 Encounter for screening for other disorder: Secondary | ICD-10-CM | POA: Diagnosis not present

## 2020-09-29 DIAGNOSIS — I700 Atherosclerosis of aorta: Secondary | ICD-10-CM | POA: Diagnosis not present

## 2020-09-29 DIAGNOSIS — F419 Anxiety disorder, unspecified: Secondary | ICD-10-CM | POA: Diagnosis not present

## 2020-09-29 DIAGNOSIS — E7800 Pure hypercholesterolemia, unspecified: Secondary | ICD-10-CM | POA: Diagnosis not present

## 2020-09-29 DIAGNOSIS — Z0000 Encounter for general adult medical examination without abnormal findings: Secondary | ICD-10-CM | POA: Diagnosis not present

## 2020-09-29 DIAGNOSIS — Z79899 Other long term (current) drug therapy: Secondary | ICD-10-CM | POA: Diagnosis not present

## 2020-10-12 DIAGNOSIS — H43812 Vitreous degeneration, left eye: Secondary | ICD-10-CM | POA: Diagnosis not present

## 2020-10-12 DIAGNOSIS — Z961 Presence of intraocular lens: Secondary | ICD-10-CM | POA: Diagnosis not present

## 2020-10-12 DIAGNOSIS — H33312 Horseshoe tear of retina without detachment, left eye: Secondary | ICD-10-CM | POA: Diagnosis not present

## 2020-10-12 DIAGNOSIS — G43109 Migraine with aura, not intractable, without status migrainosus: Secondary | ICD-10-CM | POA: Diagnosis not present

## 2020-10-12 DIAGNOSIS — H35341 Macular cyst, hole, or pseudohole, right eye: Secondary | ICD-10-CM | POA: Diagnosis not present

## 2020-10-12 NOTE — Progress Notes (Deleted)
Cardiology Office Note   Date:  10/12/2020   ID:  Julia [name], DOB 1936/03/05, MRN [id number]  PCP:  Lajean Manes, MD    No chief complaint on file.    Wt Readings from Last 3 Encounters:  12/02/19 123 lb (55.8 kg)  10/06/19 123 lb (55.8 kg)  10/07/18 126 lb 6.4 oz (57.3 kg)       History of Present Illness: Julia [name] is a 84 y.o. female  Who has had a LBBB and mild aortic insufficiency. She had a normal stress test in 2011. Echo in 2016 showed normal LV function with mild AI.  She was limited by torn foot ligamentsin 2016. This resolved.  She was given a prescription for losartan/HCTZ 50/12.5 mg but did not want to take it. BP in our office with her cuff correlated to our reading in the officen the past.  BP readings at home have been in the 989-211 systolic range, at that time.  Known severe LVH by echo. She declined ACE-I.   2018 echo: - Left ventricle: Abnormal septal motion. The cavity size was mildly dilated. There was severe concentric hypertrophy. Systolic function was normal. The estimated ejection fraction was in the range of 50% to 55%. Wall motion was normal; there were no regional wall motion abnormalities. Doppler parameters are consistent with abnormal left ventricular relaxation (grade 1 diastolic dysfunction). - Aortic valve: There was mild regurgitation. - Mitral valve: There was mild regurgitation. - Atrial septum: No defect or patent foramen ovale was identified.   Prior to [place], she exercised5x/week at the [facility]. Overall, she feels well. BP is in the 941-740 mm Hgsystolic range; outliers are in the 140 range. Now, She does exercises with a youtube video. She walks regularly.   In March 2020, had [facility]. thought to be anxiety. Sx resolved.  She has some visual issues and will be seeing a neuro-ophthalmologist in [facility], Dr. Jolyn Nap.   Holter monitor was done in early 2021  showing: "Normal sinus rhythm.  Intermittent SVT, HR up to 179 bpm, lasting two two minutes, 30 seconds.  Start metoprolol 25 mg BID."  She felt poorly on the 25 mg of metoprolol.  This was decreased to 12.5 mg p.o. twice daily.  She feels better with the lower dose of metoprolol.  She would prefer to take this as needed.    Past Medical History:  Diagnosis Date  . Aortic insufficiency    a. mild by echo 2016.  Marland Kitchen Chest pain    Nuclear March, 2011, normal, ejection fraction 64%  . Dyslipidemia   . Elevated blood pressure reading   . GERD (gastroesophageal reflux disease)    Patient sometimes has slight right lower jaw discomfort at the time of her reflux  . LBBB (left bundle branch block)   . Pericardial effusion    Echo December, 2009, small, incidental  . Shoulder pain    Left scapular pain, appeared to be radicular    Past Surgical History:  Procedure Laterality Date  . [place] VITRECTOMY WITH 20 GAUGE MVR PORT FOR MACULAR HOLE  2000  . BREAST BIOPSY  1983   benign  . CHOLECYSTECTOMY  1984  . MELANOMA EXCISION  2012, 2013  . NASAL SEPTUM SURGERY  1993  . ROTATOR CUFF REPAIR  2002     Current Outpatient Medications  Medication Sig Dispense Refill  . acetaminophen (TYLENOL) 500 MG tablet Take 500 mg by mouth as needed.    Marland Kitchen  ALPRAZolam (XANAX) 0.5 MG tablet Take 0.5 mg by mouth as needed for anxiety.     . Cholecalciferol (VITAMIN D3) 2000 UNITS capsule Take 2,000 Units by mouth daily.    . famotidine (PEPCID) 20 MG tablet Take 20 mg by mouth as needed for heartburn or indigestion.    . Melatonin 1 MG/ML LIQD Take 3 mLs by mouth at bedtime as needed.     . metoprolol tartrate (LOPRESSOR) 25 MG tablet Take 0.5 tablets (12.5 mg total) by mouth 2 (two) times daily as needed (palpitations). 90 tablet 3  . Multiple Vitamin (MULTIVITAMIN) tablet Take 1 tablet by mouth daily.     Marland Kitchen OLIVE LEAF EXTRACT PO Take 1 tablet by mouth 2 (two) times daily. 900mg  each    .  Omega-3 Fatty Acids (FISH OIL CONCENTRATE PO) Take 1,600 mg by mouth daily.     Marland Kitchen OVER THE COUNTER MEDICATION Take 1 capsule by mouth daily. STRONTIUM CITRATE ULTIMATE BONE SUPPORT    . Probiotic Product (PROBIOTIC PO) Take 1 tablet by mouth daily.    Marland Kitchen Propylene Glycol 0.6 % SOLN Place 1 drop into both eyes as needed.    . THEANINE PO Takes at bedtime as needed for sleep or stress     No current facility-administered medications for this visit.    Allergies:   Pravastatin    Social History:  The patient  reports that she has never smoked. She has never used smokeless tobacco. She reports current alcohol use. She reports that she does not use drugs.   Family History:  The patient's ***family history includes COPD in her father; Emphysema in her father; Heart murmur in her mother; Pneumonia in her father and mother.    ROS:  Please see the history of present illness.   Otherwise, review of systems are positive for ***.   All other systems are reviewed and negative.    PHYSICAL EXAM: VS:  There were no vitals taken for this visit. , BMI There is no height or weight on file to calculate BMI. GEN: Well nourished, well developed, in no acute distress  HEENT: normal  Neck: no JVD, carotid bruits, or masses Cardiac: ***RRR; no murmurs, rubs, or gallops,no edema  Respiratory:  clear to auscultation bilaterally, normal work of breathing GI: soft, nontender, nondistended, + BS MS: no deformity or atrophy  Skin: warm and dry, no rash Neuro:  Strength and sensation are intact Psych: euthymic mood, full affect   EKG:   The ekg ordered today demonstrates ***   Recent Labs: No results found for requested labs within last 8760 hours.   Lipid Panel No results found for: CHOL, TRIG, HDL, CHOLHDL, VLDL, LDLCALC, LDLDIRECT   Other studies Reviewed: Additional studies/ records that were reviewed today with results demonstrating: ***.   ASSESSMENT AND PLAN:  1. AI:   2. SVT: 3. Hyperlipidemia: 4. Severe LVH: Noted by prior echo.    Current medicines are reviewed at length with the patient today.  The patient concerns regarding her medicines were addressed.  The following changes have been made:  No change***  Labs/ tests ordered today include: *** No orders of the defined types were placed in this encounter.   Recommend 150 minutes/week of aerobic exercise Low fat, low carb, high fiber diet recommended  Disposition:   FU in ***   Signed, Larae Grooms, MD  10/12/2020 9:06 AM    [address], [place], [place]  [address] Phone: [phone])  [phone]; Fax: [phone]

## 2020-10-13 ENCOUNTER — Ambulatory Visit: Payer: Medicare Other | Admitting: Interventional Cardiology

## 2020-11-12 DIAGNOSIS — J029 Acute pharyngitis, unspecified: Secondary | ICD-10-CM | POA: Diagnosis not present

## 2020-12-03 ENCOUNTER — Ambulatory Visit: Payer: Medicare Other | Admitting: Interventional Cardiology

## 2020-12-08 DIAGNOSIS — H5202 Hypermetropia, left eye: Secondary | ICD-10-CM | POA: Diagnosis not present

## 2020-12-08 DIAGNOSIS — H35033 Hypertensive retinopathy, bilateral: Secondary | ICD-10-CM | POA: Diagnosis not present

## 2020-12-08 DIAGNOSIS — H52223 Regular astigmatism, bilateral: Secondary | ICD-10-CM | POA: Diagnosis not present

## 2020-12-08 DIAGNOSIS — I10 Essential (primary) hypertension: Secondary | ICD-10-CM | POA: Diagnosis not present

## 2020-12-08 DIAGNOSIS — H524 Presbyopia: Secondary | ICD-10-CM | POA: Diagnosis not present

## 2020-12-08 DIAGNOSIS — Z961 Presence of intraocular lens: Secondary | ICD-10-CM | POA: Diagnosis not present

## 2020-12-08 DIAGNOSIS — Z9849 Cataract extraction status, unspecified eye: Secondary | ICD-10-CM | POA: Diagnosis not present

## 2020-12-15 NOTE — Progress Notes (Signed)
Cardiology Office Note   Date:  12/16/2020   ID:  Julia [name], DOB 04-Apr-1936, MRN [id number]  PCP:  Lajean Manes, MD    No chief complaint on file.  Aortic atherosclerosis/palpitations  Wt Readings from Last 3 Encounters:  12/16/20 119 lb 6.4 oz (54.2 kg)  12/02/19 123 lb (55.8 kg)  10/06/19 123 lb (55.8 kg)       History of Present Illness: Julia [name] is a 85 y.o. female  Who has had a LBBB and mild aortic insufficiency. She had a normal stress test in 2011. Echo in 2016 showed normal LV function with mild AI.  She was limited by torn foot ligamentsin 2016. This resolved.  She was given a prescription for losartan/HCTZ 50/12.5 mg but did not want to take it. BP in our office with her cuff correlated to our reading in the officen the past.   Known severe LVH by echo. She declined ACE-I.   2018 echo: - Left ventricle: Abnormal septal motion. The cavity size was mildly dilated. There was severe concentric hypertrophy. Systolic function was normal. The estimated ejection fraction was in the range of 50% to 55%. Wall motion was normal; there were no regional wall motion abnormalities. Doppler parameters are consistent with abnormal left ventricular relaxation (grade 1 diastolic dysfunction). - Aortic valve: There was mild regurgitation. - Mitral valve: There was mild regurgitation. - Atrial septum: No defect or patent foramen ovale was identified.   Prior to [place], she exercised5x/week at the [facility]. Overall, she feels well. BP is in the 355-974 mm Hgsystolic range; outliers are in the 140 range. Now, She does exercises with a youtube video. She walks regularly.   In March, had [facility]. thought to be anxiety. Sx resolved.  She has some visual issues and will be seeing ophthalmology.    Holter monitor was done in early 2021 showing: "Normal sinus rhythm.  Intermittent SVT, HR up to 179 bpm, lasting two two minutes, 30  seconds.  Start metoprolol 25 mg BID."  She felt poorly on the 25 mg of metoprolol.  This was decreased to 12.5 mg p.o. twice daily.  She presents for follow-up today.  She felt better with the lower dose of metoprolol.  She preferred to take this as needed.    She also wondered about Lp(a).    Denies : Chest pain. Dizziness. Leg edema. Nitroglycerin use. Orthopnea. Paroxysmal nocturnal dyspnea. Shortness of breath. Syncope.   Rare palpitations. She continues exercising.  No sx with activity.   Has not used metoprolol in a while.   I reviewed the echo she had done at [facility].     Past Medical History:  Diagnosis Date  . Aortic insufficiency    a. mild by echo 2016.  Marland Kitchen Chest pain    Nuclear March, 2011, normal, ejection fraction 64%  . Dyslipidemia   . Elevated blood pressure reading   . GERD (gastroesophageal reflux disease)    Patient sometimes has slight right lower jaw discomfort at the time of her reflux  . LBBB (left bundle branch block)   . Pericardial effusion    Echo December, 2009, small, incidental  . Shoulder pain    Left scapular pain, appeared to be radicular    Past Surgical History:  Procedure Laterality Date  . [place] VITRECTOMY WITH 20 GAUGE MVR PORT FOR MACULAR HOLE  2000  . BREAST BIOPSY  1983   benign  . CHOLECYSTECTOMY  1984  . MELANOMA  EXCISION  2012, 2013  . NASAL SEPTUM SURGERY  1993  . ROTATOR CUFF REPAIR  2002     Current Outpatient Medications  Medication Sig Dispense Refill  . acetaminophen (TYLENOL) 500 MG tablet Take 500 mg by mouth as needed.    . ALPRAZolam (XANAX) 0.5 MG tablet Take 0.5 mg by mouth as needed for anxiety.    . Cholecalciferol (VITAMIN D3) 2000 UNITS capsule Take 2,000 Units by mouth daily.    . famotidine (PEPCID) 20 MG tablet Take 20 mg by mouth as needed for heartburn or indigestion.    Marland Kitchen latanoprost (XALATAN) 0.005 % ophthalmic solution Place 1 drop into the right eye at bedtime.    . metoprolol  tartrate (LOPRESSOR) 25 MG tablet Take 0.5 tablets (12.5 mg total) by mouth 2 (two) times daily as needed (palpitations). 90 tablet 3  . Multiple Vitamin (MULTIVITAMIN) tablet Take 1 tablet by mouth daily.    Marland Kitchen OLIVE LEAF EXTRACT PO Take 1 tablet by mouth 2 (two) times daily. 900mg  each    . Omega-3 Fatty Acids (FISH OIL CONCENTRATE PO) Take 1,600 mg by mouth daily.     Marland Kitchen OVER THE COUNTER MEDICATION Take 1 capsule by mouth daily. STRONTIUM CITRATE ULTIMATE BONE SUPPORT    . Probiotic Product (PROBIOTIC PO) Take 1 tablet by mouth daily.    Marland Kitchen Propylene Glycol 0.6 % SOLN Place 1 drop into both eyes as needed.    . rosuvastatin (CRESTOR) 5 MG tablet Take 5 mg by mouth daily.    . THEANINE PO Takes at bedtime as needed for sleep or stress     No current facility-administered medications for this visit.    Allergies:   Pravastatin and Trazodone hcl    Social History:  The patient  reports that she has never smoked. She has never used smokeless tobacco. She reports current alcohol use. She reports that she does not use drugs.   Family History:  The patient's family history includes COPD in her father; Emphysema in her father; Heart murmur in her mother; Pneumonia in her father and mother.    ROS:  Please see the history of present illness.   Otherwise, review of systems are positive for palpitations.   All other systems are reviewed and negative.    PHYSICAL EXAM: VS:  BP (!) 146/88   Pulse 79   Ht 5' 5.5" (1.664 m)   Wt 119 lb 6.4 oz (54.2 kg)   SpO2 98%   BMI 19.57 kg/m  , BMI Body mass index is 19.57 kg/m. GEN: Well nourished, well developed, in no acute distress  HEENT: normal  Neck: no JVD, carotid bruits, or masses Cardiac: RRR; no murmurs, rubs, or gallops,no edema  Respiratory:  clear to auscultation bilaterally, normal work of breathing GI: soft, nontender, nondistended, + BS MS: no deformity or atrophy  Skin: warm and dry, no rash Neuro:  Strength and sensation are  intact Psych: euthymic mood, full affect   EKG:   The ekg ordered today demonstrates NSR, LBBB   Recent Labs: No results found for requested labs within last 8760 hours.   Lipid Panel No results found for: CHOL, TRIG, HDL, CHOLHDL, VLDL, LDLCALC, LDLDIRECT   Other studies Reviewed: Additional studies/ records that were reviewed today with results demonstrating: labs reviewed.   ASSESSMENT AND PLAN:  1. Aortic insufficiency: No CHF.  Mild on recent echo. 2. SVT: No prolonged palpitations.  COntinue prn metoprolol 3. Hyperlipidemia: On low dose Crestor.  LDL 112  on last check. 4. Severe LVH: no CHF sx.  Noted on 2018 echo. 5. LBBB: chronic 6. Home BP readings are in the 383-291 systolic. 7. Aortic atherosclerosis: noted on CT. Continue Crestor, healthy diet and regular exercise.    Current medicines are reviewed at length with the patient today.  The patient concerns regarding her medicines were addressed.  The following changes have been made:  No change  Labs/ tests ordered today include:  No orders of the defined types were placed in this encounter.   Recommend 150 minutes/week of aerobic exercise Low fat, low carb, high fiber diet recommended  Disposition:   FU in 1 year   Signed, Larae Grooms, MD  12/16/2020 3:06 PM    [facility] [address], [place], [place]  [address] Phone: [phone]; Fax: [phone]

## 2020-12-16 ENCOUNTER — Encounter: Payer: Self-pay | Admitting: Interventional Cardiology

## 2020-12-16 ENCOUNTER — Ambulatory Visit (INDEPENDENT_AMBULATORY_CARE_PROVIDER_SITE_OTHER): Payer: Medicare Other | Admitting: Interventional Cardiology

## 2020-12-16 ENCOUNTER — Other Ambulatory Visit: Payer: Self-pay

## 2020-12-16 VITALS — BP 146/88 | HR 79 | Ht 65.5 in | Wt 119.4 lb

## 2020-12-16 DIAGNOSIS — I351 Nonrheumatic aortic (valve) insufficiency: Secondary | ICD-10-CM

## 2020-12-16 DIAGNOSIS — I517 Cardiomegaly: Secondary | ICD-10-CM

## 2020-12-16 DIAGNOSIS — E785 Hyperlipidemia, unspecified: Secondary | ICD-10-CM | POA: Diagnosis not present

## 2020-12-16 DIAGNOSIS — I447 Left bundle-branch block, unspecified: Secondary | ICD-10-CM | POA: Diagnosis not present

## 2020-12-16 DIAGNOSIS — I471 Supraventricular tachycardia: Secondary | ICD-10-CM | POA: Diagnosis not present

## 2020-12-16 MED ORDER — METOPROLOL TARTRATE 25 MG PO TABS
12.5000 mg | ORAL_TABLET | Freq: Two times a day (BID) | ORAL | 3 refills | Status: DC | PRN
Start: 2020-12-16 — End: 2021-03-11

## 2020-12-16 NOTE — Patient Instructions (Addendum)
Medication Instructions:  Your physician recommends that you continue on your current medications as directed. Please refer to the Current Medication list given to you today.  *If you need a refill on your cardiac medications before your next appointment, please call your pharmacy*   Lab Work: none If you have labs (blood work) drawn today and your tests are completely normal, you will receive your results only by: Marland Kitchen MyChart Message (if you have MyChart) OR . A paper copy in the mail If you have any lab test that is abnormal or we need to change your treatment, we will call you to review the results.   Testing/Procedures: none   Follow-Up: At [facility], you and your health needs are our priority.  As part of our continuing mission to provide you with exceptional heart care, we have created designated Provider Care Teams.  These Care Teams include your primary Cardiologist (physician) and Advanced Practice Providers (APPs -  Physician Assistants and Nurse Practitioners) who all work together to provide you with the care you need, when you need it.  We recommend signing up for the patient portal called "MyChart".  Sign up information is provided on this After Visit Summary.  MyChart is used to connect with patients for Virtual Visits (Telemedicine).  Patients are able to view lab/test results, encounter notes, upcoming appointments, etc.  Non-urgent messages can be sent to your provider as well.   To learn more about what you can do with MyChart, go to [email or web address].    Your next appointment:   12 month(s)  The format for your next appointment:   In Person  Provider:   You may see Larae Grooms, MD or one of the following Advanced Practice Providers on your designated Care Team:    Melina Copa, PA-C  Ermalinda Barrios, PA-C    Other Instructions  Your provider recommends that you maintain 150 minutes per week of moderate aerobic activity.    High-Fiber Eating  Plan Fiber, also called dietary fiber, is a type of carbohydrate. It is found foods such as fruits, vegetables, whole grains, and beans. A high-fiber diet can have many health benefits. Your health care provider may recommend a high-fiber diet to help:  Prevent constipation. Fiber can make your bowel movements more regular.  Lower your cholesterol.  Relieve the following conditions: ? Inflammation of veins in the anus (hemorrhoids). ? Inflammation of specific areas of the digestive tract (uncomplicated diverticulosis). ? A problem of the large intestine, also called the colon, that sometimes causes pain and diarrhea (irritable bowel syndrome, or IBS).  Prevent overeating as part of a weight-loss plan.  Prevent heart disease, type 2 diabetes, and certain cancers. What are tips for following this plan? Reading food labels  Check the nutrition facts label on food products for the amount of dietary fiber. Choose foods that have 5 grams of fiber or more per serving.  The goals for recommended daily fiber intake include: ? Men (age 74 or younger): 34-38 g. ? Men (over age 48): 28-34 g. ? Women (age 55 or younger): 25-28 g. ? Women (over age 58): 22-25 g. Your daily fiber goal is _____________ g.   Shopping  Choose whole fruits and vegetables instead of processed forms, such as apple juice or applesauce.  Choose a wide variety of high-fiber foods such as avocados, lentils, oats, and kidney beans.  Read the nutrition facts label of the foods you choose. Be aware of foods with added fiber. These foods often have  high sugar and sodium amounts per serving. Cooking  Use whole-grain flour for baking and cooking.  Cook with brown rice instead of white rice. Meal planning  Start the day with a breakfast that is high in fiber, such as a cereal that contains 5 g of fiber or more per serving.  Eat breads and cereals that are made with whole-grain flour instead of refined flour or white  flour.  Eat brown rice, bulgur wheat, or millet instead of white rice.  Use beans in place of meat in soups, salads, and pasta dishes.  Be sure that half of the grains you eat each day are whole grains. General information  You can get the recommended daily intake of dietary fiber by: ? Eating a variety of fruits, vegetables, grains, nuts, and beans. ? Taking a fiber supplement if you are not able to take in enough fiber in your diet. It is better to get fiber through food than from a supplement.  Gradually increase how much fiber you consume. If you increase your intake of dietary fiber too quickly, you may have bloating, cramping, or gas.  Drink plenty of water to help you digest fiber.  Choose high-fiber snacks, such as berries, raw vegetables, nuts, and popcorn. What foods should I eat? Fruits Berries. Pears. Apples. Oranges. Avocado. Prunes and raisins. Dried figs. Vegetables Sweet potatoes. Spinach. Kale. Artichokes. Cabbage. Broccoli. Cauliflower. Green peas. Carrots. Squash. Grains Whole-grain breads. Multigrain cereal. Oats and oatmeal. Brown rice. Barley. Bulgur wheat. [place]. Quinoa. Bran muffins. Popcorn. Rye wafer crackers. Meats and other proteins Navy beans, kidney beans, and pinto beans. Soybeans. Split peas. Lentils. Nuts and seeds. Dairy Fiber-fortified yogurt. Beverages Fiber-fortified soy milk. Fiber-fortified orange juice. Other foods Fiber bars. The items listed above may not be a complete list of recommended foods and beverages. Contact a dietitian for more information. What foods should I avoid? Fruits Fruit juice. Cooked, strained fruit. Vegetables Fried potatoes. Canned vegetables. Well-cooked vegetables. Grains White bread. Pasta made with refined flour. White rice. Meats and other proteins Fatty cuts of meat. Fried chicken or fried fish. Dairy Milk. Yogurt. Cream cheese. Sour cream. Fats and oils Butters. Beverages Soft drinks. Other  foods Cakes and pastries. The items listed above may not be a complete list of foods and beverages to avoid. Talk with your dietitian about what choices are best for you. Summary  Fiber is a type of carbohydrate. It is found in foods such as fruits, vegetables, whole grains, and beans.  A high-fiber diet has many benefits. It can help to prevent constipation, lower blood cholesterol, aid weight loss, and reduce your risk of heart disease, diabetes, and certain cancers.  Increase your intake of fiber gradually. Increasing fiber too quickly may cause cramping, bloating, and gas. Drink plenty of water while you increase the amount of fiber you consume.  The best sources of fiber include whole fruits and vegetables, whole grains, nuts, seeds, and beans. This information is not intended to replace advice given to you by your health care provider. Make sure you discuss any questions you have with your health care provider. Document Revised: 02/19/2020 Document Reviewed: 02/19/2020 Elsevier Patient Education  2021 [employer].

## 2020-12-27 DIAGNOSIS — H35341 Macular cyst, hole, or pseudohole, right eye: Secondary | ICD-10-CM | POA: Diagnosis not present

## 2020-12-27 DIAGNOSIS — H4051X3 Glaucoma secondary to other eye disorders, right eye, severe stage: Secondary | ICD-10-CM | POA: Diagnosis not present

## 2020-12-27 DIAGNOSIS — Z961 Presence of intraocular lens: Secondary | ICD-10-CM | POA: Diagnosis not present

## 2020-12-27 DIAGNOSIS — Z79899 Other long term (current) drug therapy: Secondary | ICD-10-CM | POA: Diagnosis not present

## 2020-12-27 DIAGNOSIS — R519 Headache, unspecified: Secondary | ICD-10-CM | POA: Diagnosis not present

## 2021-01-28 DIAGNOSIS — Z79899 Other long term (current) drug therapy: Secondary | ICD-10-CM | POA: Diagnosis not present

## 2021-01-28 DIAGNOSIS — E7800 Pure hypercholesterolemia, unspecified: Secondary | ICD-10-CM | POA: Diagnosis not present

## 2021-02-07 DIAGNOSIS — H4051X3 Glaucoma secondary to other eye disorders, right eye, severe stage: Secondary | ICD-10-CM | POA: Diagnosis not present

## 2021-02-09 ENCOUNTER — Telehealth: Payer: Self-pay

## 2021-02-09 NOTE — Telephone Encounter (Signed)
DOD call from Dr. Edilia Bo in to Dr. Burt Knack today. Dr. Burt Knack informed Dr. Edilia Bo the patient may take Timolol eye drops.

## 2021-02-16 DIAGNOSIS — H6123 Impacted cerumen, bilateral: Secondary | ICD-10-CM | POA: Diagnosis not present

## 2021-02-17 DIAGNOSIS — Z681 Body mass index (BMI) 19 or less, adult: Secondary | ICD-10-CM | POA: Diagnosis not present

## 2021-02-17 DIAGNOSIS — Z01419 Encounter for gynecological examination (general) (routine) without abnormal findings: Secondary | ICD-10-CM | POA: Diagnosis not present

## 2021-03-11 ENCOUNTER — Other Ambulatory Visit: Payer: Self-pay | Admitting: Interventional Cardiology

## 2021-03-14 DIAGNOSIS — H4051X3 Glaucoma secondary to other eye disorders, right eye, severe stage: Secondary | ICD-10-CM | POA: Diagnosis not present

## 2021-03-17 DIAGNOSIS — Z23 Encounter for immunization: Secondary | ICD-10-CM | POA: Diagnosis not present

## 2021-03-29 DIAGNOSIS — Z85820 Personal history of malignant melanoma of skin: Secondary | ICD-10-CM | POA: Diagnosis not present

## 2021-03-29 DIAGNOSIS — D1801 Hemangioma of skin and subcutaneous tissue: Secondary | ICD-10-CM | POA: Diagnosis not present

## 2021-03-29 DIAGNOSIS — D692 Other nonthrombocytopenic purpura: Secondary | ICD-10-CM | POA: Diagnosis not present

## 2021-03-29 DIAGNOSIS — D225 Melanocytic nevi of trunk: Secondary | ICD-10-CM | POA: Diagnosis not present

## 2021-03-29 DIAGNOSIS — L720 Epidermal cyst: Secondary | ICD-10-CM | POA: Diagnosis not present

## 2021-03-29 DIAGNOSIS — L821 Other seborrheic keratosis: Secondary | ICD-10-CM | POA: Diagnosis not present

## 2021-03-29 DIAGNOSIS — Z85828 Personal history of other malignant neoplasm of skin: Secondary | ICD-10-CM | POA: Diagnosis not present

## 2021-04-29 DIAGNOSIS — Z789 Other specified health status: Secondary | ICD-10-CM | POA: Diagnosis not present

## 2021-05-03 ENCOUNTER — Emergency Department (HOSPITAL_COMMUNITY)
Admission: EM | Admit: 2021-05-03 | Discharge: 2021-05-03 | Disposition: A | Discharge status: Home / Self Care | Payer: Medicare Other | Attending: Emergency Medicine | Admitting: Emergency Medicine

## 2021-05-03 ENCOUNTER — Other Ambulatory Visit: Payer: Self-pay

## 2021-05-03 ENCOUNTER — Encounter (HOSPITAL_COMMUNITY): Payer: Self-pay | Admitting: *Deleted

## 2021-05-03 ENCOUNTER — Emergency Department (HOSPITAL_COMMUNITY): Payer: Medicare Other

## 2021-05-03 DIAGNOSIS — R61 Generalized hyperhidrosis: Secondary | ICD-10-CM | POA: Insufficient documentation

## 2021-05-03 DIAGNOSIS — R1013 Epigastric pain: Secondary | ICD-10-CM | POA: Diagnosis not present

## 2021-05-03 DIAGNOSIS — R911 Solitary pulmonary nodule: Secondary | ICD-10-CM | POA: Diagnosis not present

## 2021-05-03 DIAGNOSIS — I447 Left bundle-branch block, unspecified: Secondary | ICD-10-CM | POA: Diagnosis not present

## 2021-05-03 DIAGNOSIS — R079 Chest pain, unspecified: Secondary | ICD-10-CM

## 2021-05-03 DIAGNOSIS — R0789 Other chest pain: Secondary | ICD-10-CM | POA: Diagnosis not present

## 2021-05-03 LAB — CBC
HCT: 43.9 % (ref 36.0–46.0)
Hemoglobin: 14.4 g/dL (ref 12.0–15.0)
MCH: 30 pg (ref 26.0–34.0)
MCHC: 32.8 g/dL (ref 30.0–36.0)
MCV: 91.5 fL (ref 80.0–100.0)
Platelets: 345 K/uL (ref 150–400)
RBC: 4.8 MIL/uL (ref 3.87–5.11)
RDW: 12.5 % (ref 11.5–15.5)
WBC: 7.1 K/uL (ref 4.0–10.5)
nRBC: 0 % (ref 0.0–0.2)

## 2021-05-03 LAB — TROPONIN I (HIGH SENSITIVITY)
Troponin I (High Sensitivity): 5 ng/L (ref <18)
Troponin I (High Sensitivity): 9 ng/L (ref <18)

## 2021-05-03 LAB — BASIC METABOLIC PANEL
Anion gap: 9 (ref 5–15)
BUN: 10 mg/dL (ref 8–23)
CO2: 25 mmol/L (ref 22–32)
Calcium: 9 mg/dL (ref 8.9–10.3)
Chloride: 98 mmol/L (ref 98–111)
Creatinine, Ser: 0.69 mg/dL (ref 0.44–1.00)
GFR, Estimated: >60 mL/min (ref >60)
Glucose, Bld: 93 mg/dL (ref 70–99)
Potassium: 3.7 mmol/L (ref 3.5–5.1)
Sodium: 132 mmol/L — ABNORMAL LOW (ref 135–145)

## 2021-05-03 NOTE — Discharge Instructions (Addendum)
You have been seen and discharged from the emergency department.  Your EKG was normal for you.  Chest x-ray was unremarkable and your blood work was normal, most importantly your heart enzymes were normal.  Follow-up with your cardiologist and primary provider for reevaluation and further care. Take home medications as prescribed. If you have any worsening symptoms or further concerns for your health please return to an emergency department for further evaluation.

## 2021-05-03 NOTE — ED Provider Notes (Signed)
Emergency Medicine Provider Triage Evaluation Note  Julia [name] , a 85 y.o. female  was evaluated in triage.  Pt complains of substernal chest pain with radiation to her jaw, drove herself home from [place] ASA x481 mg per EMS.  Prior history of SVT and left bundle branch block, followed by cardiologist Dr. Irish Lack.  No prior history of cardiac catheterization.  Review of Systems  Positive: Chest pain, shortness of breath  Negative: Fever  Physical Exam  SpO2 98%  Gen:   Awake, no distress   Resp:  Normal effort  MSK:   Moves extremities without difficulty  Other:    Medical Decision Making  Medically screening exam initiated at 12:50 PM.  Appropriate orders placed.  Julia [name] was informed that the remainder of the evaluation will be completed by another provider, this initial triage assessment does not replace that evaluation, and the importance of remaining in the ED until their evaluation is complete.     Julia Fitting, PA-C 05/03/21 1254    Julia Starch, MD 05/04/21 [phone]

## 2021-05-03 NOTE — ED Provider Notes (Signed)
Patient signed out to me by previous provider.  Please refer to their note for full HPI.  Briefly this is an 85 year old female who presented with concern for chest pain.  Pain lasted about 20 minutes and then did self resolve.  She currently follows with cardiology.  Lower suspicion for PE or dissection given pain pattern and resolution of symptoms/normal vitals.  EKG shows left bundle branch block which is baseline for the patient, chest x-ray is unremarkable, blood work is reassuring with a negative initial troponin.  We are pending repeat troponin and reevaluation. Physical Exam  BP 139/74   Pulse 78   Temp 98.9 F (37.2 C) (Oral)   Resp 15   SpO2 97%   Physical Exam Vitals and nursing note reviewed.  Constitutional:      Appearance: Normal appearance. She is not ill-appearing, toxic-appearing or diaphoretic.  HENT:     Head: Normocephalic.     Mouth/Throat:     Mouth: Mucous membranes are moist.  Cardiovascular:     Rate and Rhythm: Normal rate.  Pulmonary:     Effort: Pulmonary effort is normal. No tachypnea or respiratory distress.  Abdominal:     Palpations: Abdomen is soft.     Tenderness: There is no abdominal tenderness.  Musculoskeletal:     Right lower leg: No edema.     Left lower leg: No edema.  Skin:    General: Skin is warm.  Neurological:     Mental Status: She is alert and oriented to person, place, and time. Mental status is at baseline.  Psychiatric:        Mood and Affect: Mood normal.    ED Course/Procedures     Procedures  MDM   Repeat troponin is negative with no delta.  Patient has remained chest pain-free.  Low suspicion for acute ACS at this time but did recommend strict follow-up with her primary doctor and cardiologist, she will call both of their offices tomorrow.  Patient will be discharged and treated as an outpatient.  Discharge plan and strict return to ED precautions discussed, patient verbalizes understanding and agreement.        Lorelle Gibbs, DO 05/03/21 1742

## 2021-05-03 NOTE — ED Provider Notes (Signed)
[facility] EMERGENCY DEPARTMENT Provider Note   CSN: [id number] Arrival date & time: 05/03/21  1230     History Chief Complaint  Patient presents with   Chest Pain    Julia [name] is a 85 y.o. female.  The history is provided by the patient and medical records.  Chest Pain Julia [name] is a 85 y.o. female who presents to the Emergency Department complaining of chest pain. She presents the emergency department for evaluation of central chest pain that radiates to her jaw. Pain lasted about 20 minutes and started at 11 o'clock when she was driving home from [place]. Pain did resolve prior to ED arrival. She did have diaphoresis. She denies any associated fevers, cough, shortness of breath, abdominal pain, nausea, vomiting, leg swelling or pain. She has a history of SVT, hyperlipidemia, left bundle branch block. She does not smoke. No history of coronary artery disease    Past Medical History:  Diagnosis Date   Aortic insufficiency    a. mild by echo 2016.   Chest pain    Nuclear March, 2011, normal, ejection fraction 64%   Dyslipidemia    Elevated blood pressure reading    GERD (gastroesophageal reflux disease)    Patient sometimes has slight right lower jaw discomfort at the time of her reflux   LBBB (left bundle branch block)    Pericardial effusion    Echo December, 2009, small, incidental   Shoulder pain    Left scapular pain, appeared to be radicular    Patient Active Problem List   Diagnosis Date Noted   Shortness of breath 07/23/2015   Dyslipidemia    Blood pressure alteration    GERD (gastroesophageal reflux disease)    Ejection fraction    Aortic insufficiency    Shoulder pain    Chest pain    Pericardial effusion    Left bundle branch block 01/03/2010    Past Surgical History:  Procedure Laterality Date   25 GAUGE PARS PLANA VITRECTOMY WITH 20 GAUGE MVR PORT FOR MACULAR HOLE  2000   BREAST BIOPSY  1983   benign   [place]  2012, 2013   [place]  2002     OB History   No obstetric history on file.     Family History  Problem Relation Age of Onset   Emphysema Father    Pneumonia Father    COPD Father    Heart murmur Mother    Pneumonia Mother    Heart attack Neg Hx    Hypertension Neg Hx    Stroke Neg Hx     Social History   Tobacco Use   Smoking status: Never   Smokeless tobacco: Never  Vaping Use   Vaping Use: Never used  Substance Use Topics   Alcohol use: Yes   Drug use: No    Home Medications Prior to Admission medications   Medication Sig Start Date End Date Taking? Authorizing Provider  acetaminophen (TYLENOL) 500 MG tablet Take 500 mg by mouth as needed.    [provider]  ALPRAZolam Duanne Moron) 0.5 MG tablet Take 0.5 mg by mouth as needed for anxiety.    [provider]  Cholecalciferol (VITAMIN D3) 2000 UNITS capsule Take 2,000 Units by mouth daily.    [provider]  famotidine (PEPCID) 20 MG tablet Take 20 mg by mouth as needed for heartburn or indigestion.  [provider]  metoprolol tartrate (LOPRESSOR) 25 MG tablet TAKE 1/2 TABLET BY MOUTH TWICE DAILY AS NEEDED( PALPITATIONS) 03/11/21   [name], MD  Multiple Vitamin (MULTIVITAMIN) tablet Take 1 tablet by mouth daily.    [provider]  OLIVE LEAF EXTRACT PO Take 1 tablet by mouth 2 (two) times daily. 900mg  each    [provider]  Omega-3 Fatty Acids (FISH OIL CONCENTRATE PO) Take 1,600 mg by mouth daily.     [provider]  OVER THE COUNTER MEDICATION Take 1 capsule by mouth daily. STRONTIUM CITRATE ULTIMATE BONE SUPPORT    [provider]  Probiotic Product (PROBIOTIC PO) Take 1 tablet by mouth daily.    [provider]  Propylene Glycol 0.6 % SOLN Place 1 drop into both eyes as needed.    [provider]  THEANINE PO Takes at bedtime as needed for sleep  or stress    [provider]    Allergies    Pravastatin and Trazodone hcl  Review of Systems   Review of Systems  Cardiovascular:  Positive for chest pain.  All other systems reviewed and are negative.  Physical Exam Updated Vital Signs BP (!) 165/80   Pulse 72   Temp 98.9 F (37.2 C) (Oral)   Resp 17   SpO2 99%   Physical Exam Vitals and nursing note reviewed.  Constitutional:      Appearance: She is well-developed.  HENT:     Head: Normocephalic and atraumatic.  Cardiovascular:     Rate and Rhythm: Normal rate and regular rhythm.     Heart sounds: No murmur heard. Pulmonary:     Effort: Pulmonary effort is normal. No respiratory distress.     Breath sounds: Normal breath sounds.  Abdominal:     Palpations: Abdomen is soft.     Tenderness: There is no abdominal tenderness. There is no guarding or rebound.  Musculoskeletal:        General: No swelling or tenderness.  Skin:    General: Skin is warm and dry.  Neurological:     Mental Status: She is alert and oriented to person, place, and time.  Psychiatric:        Behavior: Behavior normal.    ED Results / Procedures / Treatments   Labs (all labs ordered are listed, but only abnormal results are displayed) Labs Reviewed  BASIC METABOLIC PANEL - Abnormal; Notable for the following components:      Result Value   Sodium 132 (*)    All other components within normal limits  CBC  TROPONIN I (HIGH SENSITIVITY)  TROPONIN I (HIGH SENSITIVITY)    EKG EKG Interpretation  Date/Time:  Tuesday May 03 2021 12:48:22 EDT Ventricular Rate:  78 PR Interval:  150 QRS Duration: 126 QT Interval:  408 QTC Calculation: 465 R Axis:   -45 Text Interpretation: Normal sinus rhythm Left axis deviation Left bundle branch block Abnormal ECG Confirmed by Quintella Reichert [id number]) on 05/03/2021 2:27:18 PM  Radiology DG Chest 2 View  Result Date: 05/03/2021 CLINICAL DATA:  Chest pain. EXAM: CHEST - 2 VIEW COMPARISON:  CT  09/08/2020.  Chest x-ray 12/31/2018. FINDINGS: Mediastinum and hilar structures normal. Heart size stable. Lungs are clear. Tiny previously described pulmonary nodules best identified on prior CT. No pleural effusion or pneumothorax. Stable mild basilar pleural thickening consistent scarring. Degenerative change thoracic spine. Postsurgical changes right shoulder. Degenerative changes left shoulder. IMPRESSION: No acute cardiopulmonary disease. Electronically Signed   By:  Lyman   On: 05/03/2021 13:32    Procedures Procedures   Medications Ordered in ED Medications - No data to display  ED Course  I have reviewed the triage vital signs and the nursing notes.  Pertinent labs & imaging results that were available during my care of the patient were reviewed by me and considered in my medical decision making (see chart for details).    MDM Rules/Calculators/A&P                         patient with history of SVT here for evaluation of 20 minute episode of chest pain. EKG with left bundle branch block, similar when compared to priors. Initial troponin is within normal limits. Patient care transferred pending repeat troponin. Presentation is not consistent with dissection, PE.  Final Clinical Impression(s) / ED Diagnoses Final diagnoses:  None    Rx / DC Orders ED Discharge Orders     None        Quintella Reichert, MD 05/03/21 1524

## 2021-05-03 NOTE — ED Triage Notes (Signed)
Pt arrived by [facility], had sudden onset of epigastric pain that radiates into her jaw. Pt was diaphoretic, pain relieved after drinking water. Iv started and received ASA pta.

## 2021-05-04 DIAGNOSIS — R079 Chest pain, unspecified: Secondary | ICD-10-CM | POA: Diagnosis not present

## 2021-05-25 DIAGNOSIS — H4051X3 Glaucoma secondary to other eye disorders, right eye, severe stage: Secondary | ICD-10-CM | POA: Diagnosis not present

## 2021-07-18 DIAGNOSIS — H4051X3 Glaucoma secondary to other eye disorders, right eye, severe stage: Secondary | ICD-10-CM | POA: Diagnosis not present

## 2021-07-20 DIAGNOSIS — Z20822 Contact with and (suspected) exposure to covid-19: Secondary | ICD-10-CM | POA: Diagnosis not present

## 2021-08-05 DIAGNOSIS — Z23 Encounter for immunization: Secondary | ICD-10-CM | POA: Diagnosis not present

## 2021-08-22 ENCOUNTER — Other Ambulatory Visit: Payer: Self-pay | Admitting: Geriatric Medicine

## 2021-08-22 DIAGNOSIS — Z1231 Encounter for screening mammogram for malignant neoplasm of breast: Secondary | ICD-10-CM

## 2021-08-31 ENCOUNTER — Ambulatory Visit: Payer: Medicare Other | Admitting: Interventional Cardiology

## 2021-08-31 DIAGNOSIS — I447 Left bundle-branch block, unspecified: Secondary | ICD-10-CM | POA: Diagnosis not present

## 2021-08-31 DIAGNOSIS — R000 Tachycardia, unspecified: Secondary | ICD-10-CM | POA: Diagnosis not present

## 2021-08-31 DIAGNOSIS — R457 State of emotional shock and stress, unspecified: Secondary | ICD-10-CM | POA: Diagnosis not present

## 2021-09-02 DIAGNOSIS — M810 Age-related osteoporosis without current pathological fracture: Secondary | ICD-10-CM | POA: Diagnosis not present

## 2021-09-02 DIAGNOSIS — G9331 Postviral fatigue syndrome: Secondary | ICD-10-CM | POA: Diagnosis not present

## 2021-09-02 DIAGNOSIS — Z79899 Other long term (current) drug therapy: Secondary | ICD-10-CM | POA: Diagnosis not present

## 2021-09-02 DIAGNOSIS — I700 Atherosclerosis of aorta: Secondary | ICD-10-CM | POA: Diagnosis not present

## 2021-09-02 DIAGNOSIS — R739 Hyperglycemia, unspecified: Secondary | ICD-10-CM | POA: Diagnosis not present

## 2021-09-16 DIAGNOSIS — Z8669 Personal history of other diseases of the nervous system and sense organs: Secondary | ICD-10-CM | POA: Diagnosis not present

## 2021-09-16 DIAGNOSIS — H43812 Vitreous degeneration, left eye: Secondary | ICD-10-CM | POA: Diagnosis not present

## 2021-09-16 DIAGNOSIS — Z961 Presence of intraocular lens: Secondary | ICD-10-CM | POA: Diagnosis not present

## 2021-09-16 DIAGNOSIS — G43109 Migraine with aura, not intractable, without status migrainosus: Secondary | ICD-10-CM | POA: Diagnosis not present

## 2021-09-16 DIAGNOSIS — H35341 Macular cyst, hole, or pseudohole, right eye: Secondary | ICD-10-CM | POA: Diagnosis not present

## 2021-09-19 DIAGNOSIS — H4051X3 Glaucoma secondary to other eye disorders, right eye, severe stage: Secondary | ICD-10-CM | POA: Diagnosis not present

## 2021-09-21 DIAGNOSIS — H6123 Impacted cerumen, bilateral: Secondary | ICD-10-CM | POA: Diagnosis not present

## 2021-09-29 ENCOUNTER — Ambulatory Visit
Admission: RE | Admit: 2021-09-29 | Discharge: 2021-09-29 | Disposition: A | Discharge status: Home / Self Care | Payer: Medicare Other | Source: Ambulatory Visit | Attending: Geriatric Medicine | Admitting: Geriatric Medicine

## 2021-09-29 DIAGNOSIS — Z1231 Encounter for screening mammogram for malignant neoplasm of breast: Secondary | ICD-10-CM | POA: Diagnosis not present

## 2021-10-10 DIAGNOSIS — F419 Anxiety disorder, unspecified: Secondary | ICD-10-CM | POA: Diagnosis not present

## 2021-10-10 DIAGNOSIS — E7800 Pure hypercholesterolemia, unspecified: Secondary | ICD-10-CM | POA: Diagnosis not present

## 2021-10-10 DIAGNOSIS — I700 Atherosclerosis of aorta: Secondary | ICD-10-CM | POA: Diagnosis not present

## 2021-10-10 DIAGNOSIS — Z1389 Encounter for screening for other disorder: Secondary | ICD-10-CM | POA: Diagnosis not present

## 2021-10-10 DIAGNOSIS — Z79899 Other long term (current) drug therapy: Secondary | ICD-10-CM | POA: Diagnosis not present

## 2021-10-10 DIAGNOSIS — M810 Age-related osteoporosis without current pathological fracture: Secondary | ICD-10-CM | POA: Diagnosis not present

## 2021-10-10 DIAGNOSIS — R634 Abnormal weight loss: Secondary | ICD-10-CM | POA: Diagnosis not present

## 2021-10-10 DIAGNOSIS — K219 Gastro-esophageal reflux disease without esophagitis: Secondary | ICD-10-CM | POA: Diagnosis not present

## 2021-10-10 DIAGNOSIS — Z0000 Encounter for general adult medical examination without abnormal findings: Secondary | ICD-10-CM | POA: Diagnosis not present

## 2021-10-13 ENCOUNTER — Other Ambulatory Visit: Payer: Self-pay

## 2021-10-13 ENCOUNTER — Encounter: Payer: Self-pay | Admitting: Interventional Cardiology

## 2021-10-13 ENCOUNTER — Ambulatory Visit (INDEPENDENT_AMBULATORY_CARE_PROVIDER_SITE_OTHER): Payer: Medicare Other | Admitting: Interventional Cardiology

## 2021-10-13 VITALS — BP 148/82 | HR 70 | Ht 65.5 in | Wt 111.2 lb

## 2021-10-13 DIAGNOSIS — E785 Hyperlipidemia, unspecified: Secondary | ICD-10-CM | POA: Diagnosis not present

## 2021-10-13 DIAGNOSIS — I351 Nonrheumatic aortic (valve) insufficiency: Secondary | ICD-10-CM | POA: Diagnosis not present

## 2021-10-13 DIAGNOSIS — I471 Supraventricular tachycardia: Secondary | ICD-10-CM | POA: Diagnosis not present

## 2021-10-13 DIAGNOSIS — I447 Left bundle-branch block, unspecified: Secondary | ICD-10-CM | POA: Diagnosis not present

## 2021-10-13 NOTE — Progress Notes (Signed)
Cardiology Office Note   Date:  10/13/2021   ID:  Julia [name], DOB Jun 05, 1936, MRN [id number]  PCP:  Lajean Manes, MD    No chief complaint on file.  Aortic atherosclerosis/palpitations  Wt Readings from Last 3 Encounters:  10/13/21 111 lb 3.2 oz (50.4 kg)  12/16/20 119 lb 6.4 oz (54.2 kg)  12/02/19 123 lb (55.8 kg)       History of Present Illness: Julia [name] is a 85 y.o. female  Who has had a LBBB and mild aortic insufficiency.  She had a normal stress test in 2011.  Echo in 2016 showed normal LV function with mild AI.    She was limited by torn foot ligaments in 2016. This resolved.   She was given a prescription for losartan/HCTZ 50/12.5 mg but did not want to take it.  BP in our office with her cuff correlated to our reading in the office n the past.     Known severe LVH by echo.  She declined ACE-I.    2018 echo: - Left ventricle: Abnormal septal motion. The cavity size was   mildly dilated. There was severe concentric hypertrophy. Systolic   function was normal. The estimated ejection fraction was in the   range of 50% to 55%. Wall motion was normal; there were no   regional wall motion abnormalities. Doppler parameters are   consistent with abnormal left ventricular relaxation (grade 1   diastolic dysfunction). - Aortic valve: There was mild regurgitation. - Mitral valve: There was mild regurgitation. - Atrial septum: No defect or patent foramen ovale was identified.    She goes to the [facility].  Walks other days.  Denies : Chest pain. Dizziness. Leg edema. Nitroglycerin use. Orthopnea.  Paroxysmal nocturnal dyspnea. Shortness of breath. Syncope.    Readings at home are well controlled.     Past Medical History:  Diagnosis Date   Aortic insufficiency    a. mild by echo 2016.   Chest pain    Nuclear March, 2011, normal, ejection fraction 64%   Dyslipidemia    Elevated blood pressure reading    GERD (gastroesophageal reflux disease)     Patient sometimes has slight right lower jaw discomfort at the time of her reflux   LBBB (left bundle branch block)    Pericardial effusion    Echo December, 2009, small, incidental   Shoulder pain    Left scapular pain, appeared to be radicular    Past Surgical History:  Procedure Laterality Date   25 GAUGE PARS PLANA VITRECTOMY WITH 20 GAUGE MVR PORT FOR MACULAR HOLE  2000   BREAST BIOPSY  1983   benign   CHOLECYSTECTOMY  1984   MELANOMA EXCISION  2012, 2013   [place]  2002     Current Outpatient Medications  Medication Sig Dispense Refill   acetaminophen (TYLENOL) 500 MG tablet Take 500 mg by mouth every 6 (six) hours as needed for moderate pain or headache.     ALPRAZolam (XANAX) 0.5 MG tablet Take 0.125 mg by mouth daily as needed for anxiety. Take 1/4 tablet (0.125 mg) daily PRN Anxiety     carboxymethylcellulose (REFRESH PLUS) 0.5 % SOLN Place 1 drop into both eyes 3 (three) times daily as needed (dry eyes).     Cholecalciferol (VITAMIN D3) 2000 UNITS capsule Take 2,000 Units by mouth daily.     COLLAGEN PO Take 5 mLs by mouth daily.  famotidine (PEPCID) 10 MG tablet Take 10 mg by mouth 2 (two) times daily.     Multiple Vitamin (MULTIVITAMIN) tablet Take 1 tablet by mouth daily.     multivitamin-lutein (OCUVITE-LUTEIN) CAPS capsule Take 1 capsule by mouth daily.     OLIVE LEAF EXTRACT PO Take 1 tablet by mouth 2 (two) times daily. 680 mg     OVER THE COUNTER MEDICATION Take 1 capsule by mouth every evening. STRONTIUM CITRATE ULTIMATE BONE SUPPORT     PREVIDENT 5000 BOOSTER PLUS 1.1 % PSTE Place 1 application onto teeth at bedtime.     Probiotic Product (PROBIOTIC PO) Take 1 capsule by mouth daily.     Sodium Chloride-Xylitol (XLEAR SINUS CARE SPRAY NA) Place 1 spray into the nose daily as needed (sinus congestion).     THEANINE PO Take 25-100 mg by mouth daily as needed (sleep & stress).     VYZULTA 0.024 % SOLN Place 1 drop  into the right eye at bedtime.     aspirin EC 81 MG tablet Take 81 mg by mouth daily. Swallow whole. (Patient not taking: Reported on 10/13/2021)     No current facility-administered medications for this visit.    Allergies:   Pravastatin and Trazodone hcl    Social History:  The patient  reports that she has never smoked. She has never used smokeless tobacco. She reports current alcohol use. She reports that she does not use drugs.   Family History:  The patient's family history includes COPD in her father; Emphysema in her father; Heart murmur in her mother; Pneumonia in her father and mother.    ROS:  Please see the history of present illness.   Otherwise, review of systems are positive for rare brief palpitations.   All other systems are reviewed and negative.    PHYSICAL EXAM: VS:  BP (!) 148/82    Pulse 70    Ht 5' 5.5" (1.664 m)    Wt 111 lb 3.2 oz (50.4 kg)    SpO2 95%    BMI 18.22 kg/m  , BMI Body mass index is 18.22 kg/m. GEN: Well nourished, well developed, in no acute distress HEENT: normal Neck: no JVD, carotid bruits, or masses Cardiac: RRR; no murmurs, rubs, or gallops,no edema  Respiratory:  clear to auscultation bilaterally, normal work of breathing GI: soft, nontender, nondistended, + BS MS: no deformity or atrophy Skin: warm and dry, no rash Neuro:  Strength and sensation are intact Psych: euthymic mood, full affect   EKG:   The ekg ordered today demonstrates NSR, LBBB   Recent Labs: 05/03/2021: BUN 10; Creatinine, Ser 0.69; Hemoglobin 14.4; Platelets 345; Potassium 3.7; Sodium 132   Lipid Panel No results found for: CHOL, TRIG, HDL, CHOLHDL, VLDL, LDLCALC, LDLDIRECT   Other studies Reviewed: Additional studies/ records that were reviewed today with results demonstrating: labs reviewed.   ASSESSMENT AND PLAN:  Aortic insufficiency: No CHF on exam.  Home blood pressure readings reviewed.  Well-controlled. SVT: no palpitations.  No metoprolol use.   Rare palpitations resolve with deep breathing.  Hyperlipidemia: Did not tolerate Crestor.  Plan for calcium scoring CT. LDL 118; HDL > 70.  If her calcium score is high, there would be more incentive to use statin.   Current medicines are reviewed at length with the patient today.  The patient concerns regarding her medicines were addressed.  The following changes have been made:  No change  Labs/ tests ordered today include:  No orders of the  defined types were placed in this encounter.   Recommend 150 minutes/week of aerobic exercise Low fat, low carb, high fiber diet recommended  Disposition:   FU in 1 year   Signed, Larae Grooms, MD  10/13/2021 10:22 AM    [facility] [place], [place], [place]  [address] Phone: [phone]; Fax: [phone]

## 2021-10-13 NOTE — Patient Instructions (Signed)
Medication Instructions:  Your physician recommends that you continue on your current medications as directed. Please refer to the Current Medication list given to you today.   *If you need a refill on your cardiac medications before your next appointment, please call your pharmacy*   Lab Work: none If you have labs (blood work) drawn today and your tests are completely normal, you will receive your results only by: [place] (if you have MyChart) OR A paper copy in the mail If you have any lab test that is abnormal or we need to change your treatment, we will call you to review the results.   Testing/Procedures: Dr Irish Lack recommends you have a Calcium Score CT Scan    Follow-Up: At [facility], you and your health needs are our priority.  As part of our continuing mission to provide you with exceptional heart care, we have created designated Provider Care Teams.  These Care Teams include your primary Cardiologist (physician) and Advanced Practice Providers (APPs -  Physician Assistants and Nurse Practitioners) who all work together to provide you with the care you need, when you need it.  We recommend signing up for the patient portal called "MyChart".  Sign up information is provided on this After Visit Summary.  MyChart is used to connect with patients for Virtual Visits (Telemedicine).  Patients are able to view lab/test results, encounter notes, upcoming appointments, etc.  Non-urgent messages can be sent to your provider as well.   To learn more about what you can do with MyChart, go to [email or web address].    Your next appointment:   12 month(s)  The format for your next appointment:   In Person  Provider:   Larae Grooms, MD     Other Instructions

## 2021-11-11 DIAGNOSIS — Z23 Encounter for immunization: Secondary | ICD-10-CM | POA: Diagnosis not present

## 2021-11-14 ENCOUNTER — Other Ambulatory Visit: Payer: Self-pay

## 2021-11-14 ENCOUNTER — Ambulatory Visit (INDEPENDENT_AMBULATORY_CARE_PROVIDER_SITE_OTHER)
Admission: RE | Admit: 2021-11-14 | Discharge: 2021-11-14 | Disposition: A | Discharge status: Home / Self Care | Payer: Self-pay | Source: Ambulatory Visit | Attending: Interventional Cardiology | Admitting: Interventional Cardiology

## 2021-11-14 DIAGNOSIS — I471 Supraventricular tachycardia: Secondary | ICD-10-CM

## 2021-11-14 DIAGNOSIS — I351 Nonrheumatic aortic (valve) insufficiency: Secondary | ICD-10-CM

## 2021-11-14 DIAGNOSIS — I447 Left bundle-branch block, unspecified: Secondary | ICD-10-CM

## 2021-11-14 DIAGNOSIS — E785 Hyperlipidemia, unspecified: Secondary | ICD-10-CM

## 2021-11-15 ENCOUNTER — Telehealth: Payer: Self-pay | Admitting: *Deleted

## 2021-11-15 ENCOUNTER — Encounter: Payer: Self-pay | Admitting: Interventional Cardiology

## 2021-11-15 NOTE — Telephone Encounter (Signed)
I spoke with patient and reviewed results with her.  She does not want to start Crestor at this time and if she does decide to start she would prefer to start at 5 mg daily.

## 2021-11-15 NOTE — Telephone Encounter (Signed)
-----   Message from [name], MD sent at 11/15/2021 12:38 AM EST ----- Calcium score > 100, but only 49th percentile.  Crestor 10 mg daily recommended.  Helathy diet and regular exercise.  If she starts Crestor, can plan for lipids and liver tests in 3 months,

## 2021-11-16 NOTE — Telephone Encounter (Signed)
[name], MD  You 39 minutes ago (3:22 PM)   OK. Would recommend the Crestor 5 mg at least  Patient notified.  She does not want to start Crestor at this time but will let us know if she decides she would like to start taking

## 2021-11-18 ENCOUNTER — Encounter: Payer: Self-pay | Admitting: Interventional Cardiology

## 2021-11-18 DIAGNOSIS — E785 Hyperlipidemia, unspecified: Secondary | ICD-10-CM

## 2021-11-18 MED ORDER — ROSUVASTATIN CALCIUM 5 MG PO TABS: 5.0000 mg | ORAL_TABLET | Freq: Every day | ORAL | 11 refills | Status: DC

## 2021-11-25 ENCOUNTER — Encounter: Payer: Self-pay | Admitting: Interventional Cardiology

## 2022-01-12 ENCOUNTER — Ambulatory Visit: Payer: Medicare Other | Admitting: Interventional Cardiology

## 2022-02-10 ENCOUNTER — Other Ambulatory Visit: Payer: Self-pay | Admitting: Geriatric Medicine

## 2022-02-10 DIAGNOSIS — R1313 Dysphagia, pharyngeal phase: Secondary | ICD-10-CM

## 2022-02-10 DIAGNOSIS — M810 Age-related osteoporosis without current pathological fracture: Secondary | ICD-10-CM | POA: Diagnosis not present

## 2022-02-10 DIAGNOSIS — R209 Unspecified disturbances of skin sensation: Secondary | ICD-10-CM | POA: Diagnosis not present

## 2022-02-10 DIAGNOSIS — E441 Mild protein-calorie malnutrition: Secondary | ICD-10-CM | POA: Diagnosis not present

## 2022-02-10 DIAGNOSIS — Z79899 Other long term (current) drug therapy: Secondary | ICD-10-CM | POA: Diagnosis not present

## 2022-02-10 DIAGNOSIS — R7309 Other abnormal glucose: Secondary | ICD-10-CM | POA: Diagnosis not present

## 2022-02-13 ENCOUNTER — Ambulatory Visit
Admission: RE | Admit: 2022-02-13 | Discharge: 2022-02-13 | Disposition: A | Discharge status: Home / Self Care | Payer: Medicare Other | Source: Ambulatory Visit | Attending: Geriatric Medicine | Admitting: Geriatric Medicine

## 2022-02-13 DIAGNOSIS — K2289 Other specified disease of esophagus: Secondary | ICD-10-CM | POA: Diagnosis not present

## 2022-02-13 DIAGNOSIS — R1313 Dysphagia, pharyngeal phase: Secondary | ICD-10-CM

## 2022-02-14 ENCOUNTER — Encounter: Payer: Self-pay | Admitting: Interventional Cardiology

## 2022-02-14 DIAGNOSIS — I471 Supraventricular tachycardia: Secondary | ICD-10-CM

## 2022-02-14 DIAGNOSIS — R209 Unspecified disturbances of skin sensation: Secondary | ICD-10-CM

## 2022-02-15 ENCOUNTER — Other Ambulatory Visit: Payer: Medicare Other

## 2022-02-15 NOTE — Telephone Encounter (Signed)
[name], MD   ? ?OK to add to labs, along with TSH   ? ?

## 2022-02-16 ENCOUNTER — Other Ambulatory Visit: Payer: Medicare Other

## 2022-02-16 DIAGNOSIS — R209 Unspecified disturbances of skin sensation: Secondary | ICD-10-CM

## 2022-02-16 DIAGNOSIS — I471 Supraventricular tachycardia: Secondary | ICD-10-CM | POA: Diagnosis not present

## 2022-02-16 DIAGNOSIS — E785 Hyperlipidemia, unspecified: Secondary | ICD-10-CM | POA: Diagnosis not present

## 2022-02-16 LAB — HEPATIC FUNCTION PANEL
ALT: 14 IU/L (ref 0–32)
AST: 18 IU/L (ref 0–40)
Albumin: 4.5 g/dL (ref 3.6–4.6)
Alkaline Phosphatase: 77 IU/L (ref 44–121)
Bilirubin Total: 0.6 mg/dL (ref 0.0–1.2)
Bilirubin, Direct: 0.17 mg/dL (ref 0.00–0.40)
Total Protein: 6.4 g/dL (ref 6.0–8.5)

## 2022-02-16 LAB — T3: T3, Total: 109 ng/dL (ref 71–180)

## 2022-02-16 LAB — T4: T4, Total: 6.9 ug/dL (ref 4.5–12.0)

## 2022-02-16 LAB — LIPID PANEL
Chol/HDL Ratio: 2.3 ratio (ref 0.0–4.4)
Cholesterol, Total: 189 mg/dL (ref 100–199)
HDL: 83 mg/dL (ref >39)
LDL Chol Calc (NIH): 95 mg/dL (ref 0–99)
Triglycerides: 58 mg/dL (ref 0–149)
VLDL Cholesterol Cal: 11 mg/dL (ref 5–40)

## 2022-02-22 DIAGNOSIS — H4051X3 Glaucoma secondary to other eye disorders, right eye, severe stage: Secondary | ICD-10-CM | POA: Diagnosis not present

## 2022-03-08 DIAGNOSIS — H4051X3 Glaucoma secondary to other eye disorders, right eye, severe stage: Secondary | ICD-10-CM | POA: Diagnosis not present

## 2022-03-08 DIAGNOSIS — Z961 Presence of intraocular lens: Secondary | ICD-10-CM | POA: Diagnosis not present

## 2022-03-08 DIAGNOSIS — Z8669 Personal history of other diseases of the nervous system and sense organs: Secondary | ICD-10-CM | POA: Diagnosis not present

## 2022-03-08 DIAGNOSIS — G43109 Migraine with aura, not intractable, without status migrainosus: Secondary | ICD-10-CM | POA: Diagnosis not present

## 2022-03-23 DIAGNOSIS — H6123 Impacted cerumen, bilateral: Secondary | ICD-10-CM | POA: Diagnosis not present

## 2022-04-13 DIAGNOSIS — Z8719 Personal history of other diseases of the digestive system: Secondary | ICD-10-CM | POA: Diagnosis not present

## 2022-04-13 DIAGNOSIS — R1310 Dysphagia, unspecified: Secondary | ICD-10-CM | POA: Diagnosis not present

## 2022-04-13 DIAGNOSIS — J3489 Other specified disorders of nose and nasal sinuses: Secondary | ICD-10-CM | POA: Diagnosis not present

## 2022-05-04 DIAGNOSIS — C44612 Basal cell carcinoma of skin of right upper limb, including shoulder: Secondary | ICD-10-CM | POA: Diagnosis not present

## 2022-05-04 DIAGNOSIS — Z85828 Personal history of other malignant neoplasm of skin: Secondary | ICD-10-CM | POA: Diagnosis not present

## 2022-05-04 DIAGNOSIS — L821 Other seborrheic keratosis: Secondary | ICD-10-CM | POA: Diagnosis not present

## 2022-05-04 DIAGNOSIS — C4441 Basal cell carcinoma of skin of scalp and neck: Secondary | ICD-10-CM | POA: Diagnosis not present

## 2022-05-04 DIAGNOSIS — L720 Epidermal cyst: Secondary | ICD-10-CM | POA: Diagnosis not present

## 2022-05-04 DIAGNOSIS — D2262 Melanocytic nevi of left upper limb, including shoulder: Secondary | ICD-10-CM | POA: Diagnosis not present

## 2022-05-04 DIAGNOSIS — D1801 Hemangioma of skin and subcutaneous tissue: Secondary | ICD-10-CM | POA: Diagnosis not present

## 2022-05-04 DIAGNOSIS — D2261 Melanocytic nevi of right upper limb, including shoulder: Secondary | ICD-10-CM | POA: Diagnosis not present

## 2022-05-04 DIAGNOSIS — Z85820 Personal history of malignant melanoma of skin: Secondary | ICD-10-CM | POA: Diagnosis not present

## 2022-05-15 DIAGNOSIS — H4051X3 Glaucoma secondary to other eye disorders, right eye, severe stage: Secondary | ICD-10-CM | POA: Diagnosis not present

## 2022-07-05 ENCOUNTER — Encounter: Payer: Self-pay | Admitting: Interventional Cardiology

## 2022-07-05 DIAGNOSIS — I447 Left bundle-branch block, unspecified: Secondary | ICD-10-CM | POA: Diagnosis not present

## 2022-07-05 DIAGNOSIS — R079 Chest pain, unspecified: Secondary | ICD-10-CM | POA: Diagnosis not present

## 2022-07-05 NOTE — Telephone Encounter (Signed)
Error

## 2022-07-10 NOTE — Progress Notes (Unsigned)
Cardiology Office Note:    Date:  07/11/2022   ID:  Julia [name], DOB Aug 29, 1936, MRN [id number]  PCP:  Lajean Manes, MD  [facility] Cardiologist:  Larae Grooms, MD    Referring MD: Lajean Manes, MD   Chief Complaint:  Chest Pain    Patient Profile: Coronary Ca2+ CT 10/2021: CAC score 143 (49th) Dilated ascending aorta CT 10/2021: 4.1 cm  Mild aortic insufficiency  Left Bundle Branch Block Severe LVH by echocardiogram  Echocardiogram 2018: severe LVH Echocardiogram 12.2021: normal wall thickness  Supraventricular Tachycardia  Hyperlipidemia  "[place] HTN" Aortic atherosclerosis   Prior CV Studies: CT CARDIAC SCORING (SELF PAY ONLY) 11/14/2021 IMPRESSION: Coronary calcium score of 143. This was 29 th percentile for age-, race-, and sex-matched controls. Dilated ascending thoracic aorta 4.1 cm IMPRESSION: 1.  No acute findings in the imaged extracardiac chest. 2.  Aortic Atherosclerosis (ICD10-I70.0). 3. Benign left lower lobe pulmonary nodules of maximally 5 mm.  ECHOCARDIOGRAM 09/29/2020 ([facility]) No WMA, normal LV thickness, EF 55.31, impaired relaxation, mild AI, RVSP 22.4, mild PI  LONG TERM MONITOR (3-7 DAYS) INTERPRETATION 11/19/2019 Narrative  Normal sinus rhythm.  Intermittent SVT, HR up to 179 bpm, lasting two two minutes, 30 seconds. Start metoprolol 25 mg BID.  ECHO COMPLETE WO IMAGING ENHANCING AGENT 07/11/2017 Severe concentric LVH, EF 50-55, normal wall motion, G1 DD, mild AI, mild MR    History of Present Illness:   Julia [name] is a 86 y.o. female with the above problem list.  She was last seen by Dr. Irish Lack in Dec 2022. She was seen by primary care recently for chest pain and was asked to f/u with Cardiology.   She is here today with her daughter.  She was awoken from sleep about 2 weeks ago with substernal chest discomfort.  It did not last very long (less than 5 minutes) after awakening.  She was able to go  back to sleep.  She did note associated jaw pain.  She did not have shortness of breath or nausea.  She thinks she may have been somewhat sweaty.  She has not had a recurrence.  She has noted occasional intrascapular ache.  This seems to change with positional changes.        Past Medical History:  Diagnosis Date   Aortic insufficiency    a. mild by echo 2016.   Chest pain    Nuclear March, 2011, normal, ejection fraction 64%   Dyslipidemia    Elevated blood pressure reading    GERD (gastroesophageal reflux disease)    Patient sometimes has slight right lower jaw discomfort at the time of her reflux   LBBB (left bundle branch block)    Pericardial effusion    Echo December, 2009, small, incidental   Shoulder pain    Left scapular pain, appeared to be radicular   Current Medications: Current Meds  Medication Sig   acetaminophen (TYLENOL) 500 MG tablet Take 500 mg by mouth every 6 (six) hours as needed for moderate pain or headache.   carboxymethylcellulose (REFRESH PLUS) 0.5 % SOLN Place 1 drop into both eyes 3 (three) times daily as needed (dry eyes).   Cholecalciferol (VITAMIN D3) 2000 UNITS capsule Take 2,000 Units by mouth daily.   COLLAGEN PO Take 5 mLs by mouth daily.   famotidine (PEPCID) 10 MG tablet Take 10 mg by mouth 2 (two) times daily.   metoprolol tartrate (LOPRESSOR) 100 MG tablet TAKE 1 TABLET BY MOUTH 2 HOURS  PRIOR TO THE CARDIAC C T   Multiple Vitamin (MULTIVITAMIN) tablet Take 1 tablet by mouth daily.   nitroGLYCERIN (NITROSTAT) 0.4 MG SL tablet Place 0.4 mg under the tongue every 5 (five) minutes as needed for chest pain.   OLIVE LEAF EXTRACT PO Take 1 tablet by mouth 2 (two) times daily. 680 mg   Omega-3 Fatty Acids (OMEGA 3 PO) Patient takes 1 1 tsp. (1560 mg) daily   PREVIDENT 5000 BOOSTER PLUS 1.1 % PSTE Place 1 application onto teeth at bedtime.   Probiotic Product (PROBIOTIC PO) Take 1 capsule by mouth daily.   Sodium Chloride-Xylitol (XLEAR SINUS CARE SPRAY  NA) Place 1 spray into the nose daily as needed (sinus congestion).   THEANINE PO Take 25-100 mg by mouth daily as needed (sleep & stress).   VYZULTA 0.024 % SOLN Place 1 drop into the right eye at bedtime.    Allergies:   Pravastatin and Trazodone hcl   Social History   Tobacco Use   Smoking status: Never   Smokeless tobacco: Never  Vaping Use   Vaping Use: Never used  Substance Use Topics   Alcohol use: Yes   Drug use: No    Family Hx: The patient's family history includes COPD in her father; Emphysema in her father; Heart murmur in her mother; Pneumonia in her father and mother. There is no history of Heart attack, Hypertension, or Stroke.  Review of Systems  Constitutional: Negative for fever.  Respiratory:  Negative for cough.   Gastrointestinal:  Negative for hematochezia.  Genitourinary:  Negative for hematuria.     EKGs/Labs/Other Test Reviewed:    EKG:  EKG is  ordered today.  The ekg ordered today demonstrates NSR, HR 92, left bundle branch block, QTc 467  Recent Labs: 02/16/2022: ALT 14   Recent Lipid Panel Recent Labs    02/16/22 0726  CHOL 189  TRIG 58  HDL 83  LDLCALC 95     Risk Assessment/Calculations/Metrics:              Physical Exam:    VS:  BP 118/80   Pulse 92   Ht 5' 5.5" (1.664 m)   Wt 109 lb 9.6 oz (49.7 kg)   SpO2 98%   BMI 17.96 kg/m     Wt Readings from Last 3 Encounters:  07/11/22 109 lb 9.6 oz (49.7 kg)  10/13/21 111 lb 3.2 oz (50.4 kg)  12/16/20 119 lb 6.4 oz (54.2 kg)    Constitutional:      Appearance: Healthy appearance. Not in distress.  Neck:     Vascular: JVD normal.  Pulmonary:     Effort: Pulmonary effort is normal.     Breath sounds: No wheezing. No rales.  Cardiovascular:     Normal rate. Regular rhythm. Normal S1. Normal S2.      Murmurs: There is no murmur.  Edema:    Peripheral edema absent.  Abdominal:     Palpations: Abdomen is soft.  Skin:    General: Skin is warm and dry.  Neurological:      Mental Status: Alert and oriented to person, place and time.         ASSESSMENT & PLAN:   Precordial chest pain She had an episode of substernal chest pain with associated jaw pain about 2 weeks ago.  This awoke her from sleep.  She had a similar episode a little over a year ago she went to the emergency room and had negative troponins.  At this point, checking troponins will not be helpful.  She has had a Left Bundle Branch Block. Therefore, it is not possible to determine if she has had a change on her EKG. She does have coronary calcification noted on prior CT.  Arrange coronary CTA to rule out significant coronary artery disease that would contribute to her symptoms.  Also, arrange an echocardiogram to rule out new wall motion abnormalities, reduced EF.  We discussed how to use nitroglycerin as needed.  This was prescribed to her by primary care.  She has stopped taking statin, ASA. I have asked her to take ASA 81 mg once daily. F/u in 3 mos or sooner if CCTA/Echocardiogram abnormal.   Coronary artery calcification seen on CT scan As noted, prior CT scan did demonstrate elevated calcium score.  This places her in the 49th percentile.  She does not necessarily have to take aspirin long-term but should be on statin therapy.  She stopped statin therapy recently.  Proceed with CCTA and echo as noted.  I have asked her to take aspirin 81 mg once daily.  We can revisit +/- on statin therapy at follow-up.  Aortic insufficiency Mild by echo in 2021.  Proceed with follow-up echocardiogram as noted.    Blood pressure alteration BP usually elevated in clinic but normal at home. Repeat BP by me was optimal.   Aortic atherosclerosis ([place]) Noted on CAC score CT earlier this year. Restart ASA. We can discuss statin Rx again at f/u.             Dispo:  Return in about 3 months (around 10/10/2022) for Routine Follow Up with Dr. Irish Lack.   Medication Adjustments/Labs and Tests Ordered: Current medicines  are reviewed at length with the patient today.  Concerns regarding medicines are outlined above.  Tests Ordered: Orders Placed This Encounter  Procedures   CT CORONARY MORPH W/CTA COR W/SCORE W/CA W/CM &/OR WO/CM   Basic metabolic panel   EKG 86-PYPP   ECHOCARDIOGRAM COMPLETE   Medication Changes: Meds ordered this encounter  Medications   metoprolol tartrate (LOPRESSOR) 100 MG tablet    Sig: TAKE 1 TABLET BY MOUTH 2 HOURS PRIOR TO THE CARDIAC C T    Dispense:  1 tablet    Refill:  0   Signed, Richardson Dopp, PA-C  07/11/2022 1:16 PM    [facility] [address], [place], [place]  [address] Phone: [phone]; Fax: [phone]

## 2022-07-11 ENCOUNTER — Encounter: Payer: Self-pay | Admitting: Physician Assistant

## 2022-07-11 ENCOUNTER — Ambulatory Visit: Payer: Medicare Other | Attending: Physician Assistant | Admitting: Physician Assistant

## 2022-07-11 ENCOUNTER — Telehealth: Payer: Self-pay | Admitting: Interventional Cardiology

## 2022-07-11 VITALS — BP 118/80 | HR 92 | Ht 65.5 in | Wt 109.6 lb

## 2022-07-11 DIAGNOSIS — I700 Atherosclerosis of aorta: Secondary | ICD-10-CM | POA: Insufficient documentation

## 2022-07-11 DIAGNOSIS — R072 Precordial pain: Secondary | ICD-10-CM | POA: Diagnosis not present

## 2022-07-11 DIAGNOSIS — R6889 Other general symptoms and signs: Secondary | ICD-10-CM | POA: Insufficient documentation

## 2022-07-11 DIAGNOSIS — I351 Nonrheumatic aortic (valve) insufficiency: Secondary | ICD-10-CM | POA: Insufficient documentation

## 2022-07-11 DIAGNOSIS — I2510 Atherosclerotic heart disease of native coronary artery without angina pectoris: Secondary | ICD-10-CM | POA: Insufficient documentation

## 2022-07-11 DIAGNOSIS — I471 Supraventricular tachycardia: Secondary | ICD-10-CM | POA: Diagnosis not present

## 2022-07-11 HISTORY — DX: Atherosclerotic heart disease of native coronary artery without angina pectoris: I25.10

## 2022-07-11 MED ORDER — METOPROLOL TARTRATE 100 MG PO TABS: ORAL_TABLET | ORAL | 0 refills | Status: DC

## 2022-07-11 NOTE — Patient Instructions (Signed)
Medication Instructions:  Your physician has recommended you make the following change in your medication:   START Aspirin 81 mg taking 1 daily  *If you need a refill on your cardiac medications before your next appointment, please call your pharmacy*   Lab Work: TODAY:  BMET  If you have labs (blood work) drawn today and your tests are completely normal, you will receive your results only by: [place] (if you have MyChart) OR A paper copy in the mail If you have any lab test that is abnormal or we need to change your treatment, we will call you to review the results.   Testing/Procedures: Your physician has requested that you have an echocardiogram. Echocardiography is a painless test that uses sound waves to create images of your heart. It provides your doctor with information about the size and shape of your heart and how well your heart's chambers and valves are working. This procedure takes approximately one hour. There are no restrictions for this procedure.  Your physician has requested that you have cardiac CT. Cardiac computed tomography (CT) is a painless test that uses an x-ray machine to take clear, detailed pictures of your heart. For further information please visit [email or web address]. Please follow instruction sheet BELOW:    Your cardiac CT will be scheduled at one of the below locations:   [facility] [address] [place], [place] [address] [phone]  Please arrive at the [facility] En[facility] (Entrance C2) of [facility] 30 minutes prior to test start time. You can use the FREE valet parking offered at entrance C (encouraged to control the heart rate for the test)  Proceed to the [facility] Radiology Department (first floor) to check-in and test prep.  All radiology patients and guests should use entrance C2 at [facility], accessed from [facility], even though the hospital's physical address listed is  [address]     Please follow these instructions carefully (unless otherwise directed):   On the Night Before the Test: Be sure to Drink plenty of water. Do not consume any caffeinated/decaffeinated beverages or chocolate 12 hours prior to your test. Do not take any antihistamines 12 hours prior to your test.   On the Day of the Test: Drink plenty of water until 1 hour prior to the test. You may take your regular medications prior to the test.  Take metoprolol (Lopressor) 100 mg two hours prior to test. THIS HAS BEEN SENT TO [place]- please wear underwire-free bra if available, avoid dresses & tight clothing       After the Test: Drink plenty of water. After receiving IV contrast, you may experience a mild flushed feeling. This is normal. On occasion, you may experience a mild rash up to 24 hours after the test. This is not dangerous. If this occurs, you can take Benadryl 25 mg and increase your fluid intake. If you experience trouble breathing, this can be serious. If it is severe call 911 IMMEDIATELY. If it is mild, please call our office.  We will call to schedule your test 2-4 weeks out understanding that some insurance companies will need an authorization prior to the service being performed.   For non-scheduling related questions, please contact the cardiac imaging nurse navigator should you have any questions/concerns: Marchia Bond, Cardiac Imaging Nurse Navigator Gordy Clement, Cardiac Imaging Nurse Navigator [facility] Direct Office Dial: [phone]   For scheduling needs, including cancellations and  rescheduling, please call [place], [phone].    Follow-Up: At [facility], you and your health needs are our priority.  As part of our continuing mission to provide you with exceptional heart care, we have created designated Provider Care Teams.  These Care Teams include your primary Cardiologist  (physician) and Advanced Practice Providers (APPs -  Physician Assistants and Nurse Practitioners) who all work together to provide you with the care you need, when you need it.  We recommend signing up for the patient portal called "MyChart".  Sign up information is provided on this After Visit Summary.  MyChart is used to connect with patients for Virtual Visits (Telemedicine).  Patients are able to view lab/test results, encounter notes, upcoming appointments, etc.  Non-urgent messages can be sent to your provider as well.   To learn more about what you can do with MyChart, go to [email or web address].    Your next appointment:   3 month(s)  The format for your next appointment:   In Person  Provider:   Larae Grooms, MD     Other Instructions   Important Information About [place]

## 2022-07-11 NOTE — Telephone Encounter (Signed)
Pt was in to see Richardson Dopp, PA and believes she needs to ask more questions in regards to CT test ordered and believes her concerns with feeling dizzy may be of concern more with possible stroke and she didn't get a clear understanding on if this test will help determine this. Please advise.

## 2022-07-11 NOTE — Assessment & Plan Note (Signed)
BP usually elevated in clinic but normal at home. Repeat BP by me was optimal.

## 2022-07-11 NOTE — Assessment & Plan Note (Signed)
Mild by echo in 2021.  Proceed with follow-up echocardiogram as noted.

## 2022-07-11 NOTE — Assessment & Plan Note (Addendum)
She had an episode of substernal chest pain with associated jaw pain about 2 weeks ago.  This awoke her from sleep.  She had a similar episode a little over a year ago she went to the emergency room and had negative troponins.  At this point, checking troponins will not be helpful.  She has had a Left Bundle Branch Block. Therefore, it is not possible to determine if she has had a change on her EKG. She does have coronary calcification noted on prior CT.  Arrange coronary CTA to rule out significant coronary artery disease that would contribute to her symptoms.  Also, arrange an echocardiogram to rule out new wall motion abnormalities, reduced EF.  We discussed how to use nitroglycerin as needed.  This was prescribed to her by primary care.  She has stopped taking statin, ASA. I have asked her to take ASA 81 mg once daily. F/u in 3 mos or sooner if CCTA/Echocardiogram abnormal.

## 2022-07-11 NOTE — Telephone Encounter (Signed)
Pt sent MyChart message. See message in chart. I have encouraged her to go ahead and get CCTA scheduled. Please make sure CCTA is scheduled after her echocardiogram. Richardson Dopp, PA-C    07/11/2022 4:35 PM

## 2022-07-11 NOTE — Assessment & Plan Note (Signed)
Noted on CAC score CT earlier this year. Restart ASA. We can discuss statin Rx again at f/u.

## 2022-07-11 NOTE — Telephone Encounter (Signed)
Echo scheduled for 9/19, Coronary CTA scheduled for 07/28/22.

## 2022-07-11 NOTE — Assessment & Plan Note (Addendum)
As noted, prior CT scan did demonstrate elevated calcium score.  This places her in the 49th percentile.  She does not necessarily have to take aspirin long-term but should be on statin therapy.  She stopped statin therapy recently.  Proceed with CCTA and echo as noted.  I have asked her to take aspirin 81 mg once daily.  We can revisit +/- on statin therapy at follow-up.

## 2022-07-12 LAB — BASIC METABOLIC PANEL
BUN/Creatinine Ratio: 21 (ref 12–28)
BUN: 15 mg/dL (ref 8–27)
CO2: 22 mmol/L (ref 20–29)
Calcium: 9.7 mg/dL (ref 8.7–10.3)
Chloride: 96 mmol/L (ref 96–106)
Creatinine, Ser: 0.72 mg/dL (ref 0.57–1.00)
Glucose: 95 mg/dL (ref 70–99)
Potassium: 4.8 mmol/L (ref 3.5–5.2)
Sodium: 134 mmol/L (ref 134–144)
eGFR: 82 mL/min/{1.73_m2} (ref >59)

## 2022-07-18 ENCOUNTER — Ambulatory Visit (HOSPITAL_COMMUNITY): Payer: Medicare Other | Attending: Physician Assistant

## 2022-07-18 DIAGNOSIS — R072 Precordial pain: Secondary | ICD-10-CM | POA: Insufficient documentation

## 2022-07-18 DIAGNOSIS — I351 Nonrheumatic aortic (valve) insufficiency: Secondary | ICD-10-CM | POA: Insufficient documentation

## 2022-07-18 LAB — ECHOCARDIOGRAM COMPLETE
Area-P 1/2: 5.27 cm2
S' Lateral: 3 cm

## 2022-07-20 ENCOUNTER — Encounter: Payer: Self-pay | Admitting: Physician Assistant

## 2022-07-20 MED ORDER — ISOSORBIDE MONONITRATE ER 30 MG PO TB24: 15.0000 mg | ORAL_TABLET | Freq: Every day | ORAL | 3 refills | Status: DC

## 2022-07-20 NOTE — Telephone Encounter (Signed)
Spoke with pt regarding message below.  She has also been made aware of her Echocardiogram results.  Will send in the Imdur 15 mg for pt to take.  She will let us know whether she does / does not start.

## 2022-07-20 NOTE — Telephone Encounter (Signed)
If pt is willing, send in Imdur 15 mg once daily. Richardson Dopp, PA-C    07/20/2022 9:11 AM

## 2022-07-26 ENCOUNTER — Telehealth (HOSPITAL_COMMUNITY): Payer: Self-pay | Admitting: *Deleted

## 2022-07-26 NOTE — Telephone Encounter (Signed)
Patient calling about her upcoming cardiac imaging study; pt verbalizes understanding of appt date/time, parking situation and where to check in, medications ordered, and verified current allergies; name and call back number provided for further questions should they arise  Gordy Clement RN Navigator Cardiac Imaging [place] [facility] [phone] office [phone] cell  Patient to take '100mg'$  metoprolol tartrate TWO hours prior to her cardiac CT scan. She denies being a difficult IV start and is aware to arrive at 12pm.

## 2022-07-28 ENCOUNTER — Ambulatory Visit (HOSPITAL_COMMUNITY)
Admission: RE | Admit: 2022-07-28 | Discharge: 2022-07-28 | Disposition: A | Discharge status: Home / Self Care | Payer: Medicare Other | Source: Ambulatory Visit | Attending: Physician Assistant | Admitting: Physician Assistant

## 2022-07-28 DIAGNOSIS — R072 Precordial pain: Secondary | ICD-10-CM | POA: Insufficient documentation

## 2022-07-28 MED ORDER — NITROGLYCERIN 0.4 MG SL SUBL
SUBLINGUAL_TABLET | SUBLINGUAL | Status: AC
  Filled 2022-07-28: qty 2

## 2022-07-28 MED ORDER — IOHEXOL 350 MG/ML SOLN
100.0000 mL | Freq: Once | INTRAVENOUS | Status: AC | PRN
  Administered 2022-07-28: 100 mL via INTRAVENOUS

## 2022-07-28 MED ORDER — NITROGLYCERIN 0.4 MG SL SUBL
0.8000 mg | SUBLINGUAL_TABLET | Freq: Once | SUBLINGUAL | Status: AC
  Administered 2022-07-28: 0.8 mg via SUBLINGUAL

## 2022-07-31 ENCOUNTER — Telehealth: Payer: Self-pay | Admitting: *Deleted

## 2022-07-31 ENCOUNTER — Encounter: Payer: Self-pay | Admitting: Physician Assistant

## 2022-07-31 DIAGNOSIS — I2510 Atherosclerotic heart disease of native coronary artery without angina pectoris: Secondary | ICD-10-CM

## 2022-07-31 MED ORDER — EZETIMIBE 10 MG PO TABS: 10.0000 mg | ORAL_TABLET | Freq: Every day | ORAL | 3 refills | Status: DC

## 2022-07-31 NOTE — Telephone Encounter (Signed)
-----   Message from Liliane Shi, [place] sent at 07/31/2022  8:05 AM EDT ----- CCTA shows mild non-obstructive coronary artery disease. This will not cause chest pain but does need to be managed medically. It looks like she could not tolerate Pravastatin in the past. I would recommend trying Zetia to get LDL to goal. PLAN:  -Start Zetia 10 mg once daily  -Continue ASA 81 mg once daily  -Fasting Lipids, ALT in 3 mos  -F/u was planned.  -Send copy of CCTA and Echocardiogram to PCP  Richardson Dopp, PA-C    07/31/2022 7:34 AM

## 2022-07-31 NOTE — Telephone Encounter (Signed)
Returned call to pt and she just wanted to confirm who we sent her results to.  She has been made aware that we only sent them to her PCP.  She thanked me for the call back.

## 2022-07-31 NOTE — Telephone Encounter (Signed)
Returned call to pt.  She has been reassured of her Cardiac CT Results and she thanked me for the call.

## 2022-07-31 NOTE — Telephone Encounter (Signed)
Results forwarded to Dr. Irish Lack. Please make sure echocardiogram and CCTA report goes to PCP. Richardson Dopp, PA-C    07/31/2022 8:21 AM

## 2022-07-31 NOTE — Telephone Encounter (Signed)
  Pt is calling back again, she apologize she said there's another question she forgot to ask Anderson Malta again

## 2022-07-31 NOTE — Telephone Encounter (Signed)
  Pt is calling back and would like to speak with Anderson Malta again, she said, she forgot to ask her something

## 2022-08-04 DIAGNOSIS — Z780 Asymptomatic menopausal state: Secondary | ICD-10-CM | POA: Diagnosis not present

## 2022-08-04 DIAGNOSIS — M810 Age-related osteoporosis without current pathological fracture: Secondary | ICD-10-CM | POA: Diagnosis not present

## 2022-08-04 DIAGNOSIS — M8588 Other specified disorders of bone density and structure, other site: Secondary | ICD-10-CM | POA: Diagnosis not present

## 2022-08-04 LAB — HM DEXA SCAN

## 2022-08-07 NOTE — Telephone Encounter (Signed)
Pt advised Scott's notes and recommendations... she denies any palpitations and no presyncope or syncope... she will alert [place] if any change and will call her PCP for a future appt to follow up on her current symptoms.

## 2022-08-07 NOTE — Telephone Encounter (Signed)
Please call the patient. Her Echocardiogram showed normal EF. There was a small pericardial effusion that was old when compared to an old echocardiogram. The CCTA showed mild non-obstructive CAD. Cardiac workup was reassuring and did not show anything that would explain her symptoms of lightheadedness, shakiness and not feeling sharp. I recommend she follow up with her PCP to investigate other causes. Ask patient if she is having palpitations, syncope or near syncope. If so, we can order a 14 day Zio XT to evaluate those symptoms. Otherwise, I would have her follow up with her PCP to evaluate non-cardiac causes for her symptoms.  Richardson Dopp, PA-C    08/07/2022 4:37 PM

## 2022-08-08 DIAGNOSIS — H43812 Vitreous degeneration, left eye: Secondary | ICD-10-CM | POA: Diagnosis not present

## 2022-08-08 DIAGNOSIS — Z961 Presence of intraocular lens: Secondary | ICD-10-CM | POA: Diagnosis not present

## 2022-08-08 DIAGNOSIS — G43109 Migraine with aura, not intractable, without status migrainosus: Secondary | ICD-10-CM | POA: Diagnosis not present

## 2022-08-08 DIAGNOSIS — H35341 Macular cyst, hole, or pseudohole, right eye: Secondary | ICD-10-CM | POA: Diagnosis not present

## 2022-08-08 DIAGNOSIS — Z8669 Personal history of other diseases of the nervous system and sense organs: Secondary | ICD-10-CM | POA: Diagnosis not present

## 2022-08-17 DIAGNOSIS — Z23 Encounter for immunization: Secondary | ICD-10-CM | POA: Diagnosis not present

## 2022-08-21 DIAGNOSIS — Z85820 Personal history of malignant melanoma of skin: Secondary | ICD-10-CM | POA: Diagnosis not present

## 2022-08-21 DIAGNOSIS — C44311 Basal cell carcinoma of skin of nose: Secondary | ICD-10-CM | POA: Diagnosis not present

## 2022-08-21 DIAGNOSIS — Z85828 Personal history of other malignant neoplasm of skin: Secondary | ICD-10-CM | POA: Diagnosis not present

## 2022-08-21 DIAGNOSIS — L720 Epidermal cyst: Secondary | ICD-10-CM | POA: Diagnosis not present

## 2022-08-21 DIAGNOSIS — L853 Xerosis cutis: Secondary | ICD-10-CM | POA: Diagnosis not present

## 2022-08-24 DIAGNOSIS — H4051X3 Glaucoma secondary to other eye disorders, right eye, severe stage: Secondary | ICD-10-CM | POA: Diagnosis not present

## 2022-08-25 DIAGNOSIS — Z23 Encounter for immunization: Secondary | ICD-10-CM | POA: Diagnosis not present

## 2022-08-29 ENCOUNTER — Encounter: Payer: Self-pay | Admitting: Interventional Cardiology

## 2022-08-30 ENCOUNTER — Other Ambulatory Visit: Payer: Self-pay | Admitting: Internal Medicine

## 2022-08-30 DIAGNOSIS — Z1231 Encounter for screening mammogram for malignant neoplasm of breast: Secondary | ICD-10-CM

## 2022-08-31 NOTE — Telephone Encounter (Signed)
Message from [place] some studies that showed it may be beneficial and others that didn't show much change.  Most promising from the European Heart Journal in October 2022.  Conclusion: Patients with no prior coronary disease randomized to vitamin K2 supplementation had a non-significant reduction in CAC development over a 2-year follow-up period. High-risk patients with CAC =400 AU had a significantly lower progression of CAC. Additionally, vitamin K2 supplementation significantly reduced the risk of AMI, revascularization and all-cause death.  Other studies I saw found negligible impact. Regardless, do not see any contraindications to her trying it.

## 2022-09-19 DIAGNOSIS — H838X3 Other specified diseases of inner ear, bilateral: Secondary | ICD-10-CM | POA: Diagnosis not present

## 2022-09-19 DIAGNOSIS — H6123 Impacted cerumen, bilateral: Secondary | ICD-10-CM | POA: Diagnosis not present

## 2022-09-19 DIAGNOSIS — H903 Sensorineural hearing loss, bilateral: Secondary | ICD-10-CM | POA: Diagnosis not present

## 2022-10-10 DIAGNOSIS — Z85820 Personal history of malignant melanoma of skin: Secondary | ICD-10-CM | POA: Diagnosis not present

## 2022-10-10 DIAGNOSIS — C44311 Basal cell carcinoma of skin of nose: Secondary | ICD-10-CM | POA: Diagnosis not present

## 2022-10-10 DIAGNOSIS — Z85828 Personal history of other malignant neoplasm of skin: Secondary | ICD-10-CM | POA: Diagnosis not present

## 2022-10-27 ENCOUNTER — Ambulatory Visit
Admission: RE | Admit: 2022-10-27 | Discharge: 2022-10-27 | Disposition: A | Discharge status: Home / Self Care | Payer: Medicare Other | Source: Ambulatory Visit | Attending: Internal Medicine | Admitting: Internal Medicine

## 2022-10-27 DIAGNOSIS — Z1231 Encounter for screening mammogram for malignant neoplasm of breast: Secondary | ICD-10-CM

## 2022-11-07 ENCOUNTER — Encounter: Payer: Self-pay | Admitting: Interventional Cardiology

## 2022-11-09 NOTE — Progress Notes (Signed)
Cardiology Office Note   Date:  11/10/2022   ID:  [name], DOB [birth year], MRN [id number]  PCP:  Charlane Ferretti, MD    No chief complaint on file.  Coronary artery calcification  Wt Readings from Last 3 Encounters:  11/10/22 105 lb 3.2 oz (47.7 kg)  07/11/22 109 lb 9.6 oz (49.7 kg)  10/13/21 111 lb 3.2 oz (50.4 kg)       History of Present Illness: Julia [name] is a 87 y.o. female   Who has had a LBBB and mild aortic insufficiency.  She had a normal stress test in 2011.  Echo in 2016 showed normal LV function with mild AI.    She was limited by torn foot ligaments in 2016. This resolved.   She was given a prescription for losartan/HCTZ 50/12.5 mg but did not want to take it.  BP in our office with her cuff correlated to our reading in the office n the past.     Known severe LVH by echo.  She declined ACE-I.   Coronary Ca2+ CT 10/2021: CAC score 143 (49th) Dilated ascending aorta CT 10/2021: 4.1 cm  Mild aortic insufficiency  Left Bundle Branch Block Severe LVH by echocardiogram  Echocardiogram 2018: severe LVH Echocardiogram 12.2021: normal wall thickness  Supraventricular Tachycardia  Hyperlipidemia  "White Coat HTN" Aortic atherosclerosis    Prior CV Studies: CT CARDIAC SCORING (SELF PAY ONLY) 11/14/2021 IMPRESSION: Coronary calcium score of 143. This was 4 th percentile for age-, race-, and sex-matched controls. Dilated ascending thoracic aorta 4.1 cm IMPRESSION: 1.  No acute findings in the imaged extracardiac chest. 2.  Aortic Atherosclerosis (ICD10-I70.0). 3. Benign left lower lobe pulmonary nodules of maximally 5 mm.   ECHOCARDIOGRAM 09/29/2020 ([facility]) No WMA, normal LV thickness, EF 55.31, impaired relaxation, mild AI, RVSP 22.4, mild PI   LONG TERM MONITOR (3-7 DAYS) INTERPRETATION 11/19/2019 Narrative  Normal sinus rhythm.  Intermittent SVT, HR up to 179 bpm, lasting two two minutes, 30 seconds. Start metoprolol 25 mg BID.   ECHO  COMPLETE WO IMAGING ENHANCING AGENT 07/11/2017 Severe concentric LVH, EF 50-55, normal wall motion, G1 DD, mild AI, mild MR   Denies : Chest pain. Dizziness. Leg edema. Nitroglycerin use. Orthopnea.  Paroxysmal nocturnal dyspnea. Shortness of breath. Syncope.    Rare palpitations, brief, relieved by deep breathing.   9/23 CTA coronaries: "Coronary Arteries: Right dominant with no anomalies   LM: No plaque or stenosis.   LAD system: Relatively small LAD. Calcified plaque with mild (25-49%) stenosis in the mid LAD.   Circumflex system: Small AV LCx, large OM1. Mixed plaque with mild (25-49%) stenosis in proximal OM1.   RCA system: Calcified plaque in the proximal RCA, mild (1-24%) stenosis. Calcified plaque in the PLV, mild (1-24%) stenosis.   IMPRESSION: 1.  Small pericardial effusion noted.   2. Coronary artery calcium score 158 Agatston units. This places the patient in the 49th percentile for age and gender, suggesting intermediate risk for future cardiac events.   3.  Nonobstructive CAD."  Past Medical History:  Diagnosis Date   Aortic insufficiency    a. mild by echo 2016. // Echo 06/2022: EF 50-55, no RWMA, septal movement consistent with LBBB, GR 1 DD, normal RVSF, small pericardial effusion, mild MR, mild AI, AV sclerosis without stenosis, RA pressure 3   Chest pain    Nuclear March, 2011, normal, ejection fraction 64%   Coronary artery disease involving native coronary artery of native heart without  angina pectoris 07/11/2022   CCTA 9/23: CAC score 158 (49th percentile); non-obstructive CAD (mLAD 25-49, pOM1 25-49, pRCA 1-24, PLV 1-24); small pericardial effusion   Dyslipidemia    Elevated blood pressure reading    GERD (gastroesophageal reflux disease)    Patient sometimes has slight right lower jaw discomfort at the time of her reflux   LBBB (left bundle branch block)    Pericardial effusion    Echo December, 2009, small, incidental   Shoulder pain    Left  scapular pain, appeared to be radicular    Past Surgical History:  Procedure Laterality Date   25 GAUGE PARS PLANA VITRECTOMY WITH 20 GAUGE MVR PORT FOR MACULAR HOLE  2000   BREAST BIOPSY  1983   benign   CHOLECYSTECTOMY  1984   MELANOMA EXCISION  2012, 2013   [place]  2002     Current Outpatient Medications  Medication Sig Dispense Refill   acetaminophen (TYLENOL) 500 MG tablet Take 500 mg by mouth every 6 (six) hours as needed for moderate pain or headache.     aspirin 81 MG chewable tablet Chew 81 mg by mouth daily.     carboxymethylcellulose (REFRESH PLUS) 0.5 % SOLN Place 1 drop into both eyes 3 (three) times daily as needed (dry eyes).     Cholecalciferol (VITAMIN D3) 2000 UNITS capsule Take 2,000 Units by mouth daily.     COLLAGEN PO Take 5 mLs by mouth daily.     ezetimibe (ZETIA) 10 MG tablet Take 1 tablet (10 mg total) by mouth daily. 90 tablet 3   famotidine (PEPCID) 10 MG tablet Take 10 mg by mouth 2 (two) times daily.     Multiple Vitamin (MULTIVITAMIN) tablet Take 1 tablet by mouth daily.     nitroGLYCERIN (NITROSTAT) 0.4 MG SL tablet Place 0.4 mg under the tongue every 5 (five) minutes as needed for chest pain.     PREVIDENT 5000 BOOSTER PLUS 1.1 % PSTE Place 1 application onto teeth at bedtime.     Probiotic Product (PROBIOTIC PO) Take 1 capsule by mouth daily.     Sodium Chloride-Xylitol (XLEAR SINUS CARE SPRAY NA) Place 1 spray into the nose daily as needed (sinus congestion).     THEANINE PO Take 25-100 mg by mouth daily as needed (sleep & stress).     VYZULTA 0.024 % SOLN Place 1 drop into the right eye at bedtime.     No current facility-administered medications for this visit.    Allergies:   Pravastatin and Trazodone hcl    Social History:  The patient  reports that she has never smoked. She has never used smokeless tobacco. She reports current alcohol use. She reports that she does not use drugs.   Family  History:  The patient's family history includes COPD in her father; Emphysema in her father; Heart murmur in her mother; Pneumonia in her father and mother.    ROS:  Please see the history of present illness.   Otherwise, review of systems are positive for palpitations.   All other systems are reviewed and negative.    PHYSICAL EXAM: VS:  BP 108/78   Pulse 83   Ht '5\' 6"'$  (1.676 m)   Wt 105 lb 3.2 oz (47.7 kg)   SpO2 99%   BMI 16.98 kg/m  , BMI Body mass index is 16.98 kg/m. GEN: Well nourished, well developed, in no acute distress HEENT: normal Neck: no JVD, carotid  bruits, or masses Cardiac: RRR; no murmurs, rubs, or gallops,no edema  Respiratory:  clear to auscultation bilaterally, normal work of breathing GI: soft, nontender, nondistended, + BS MS: no deformity or atrophy Skin: warm and dry, no rash Neuro:  Strength and sensation are intact Psych: euthymic mood, full affect   EKG:   The ekg ordered 9/23 demonstrates NSR, LBBB   Recent Labs: 02/16/2022: ALT 14 07/11/2022: BUN 15; Creatinine, Ser 0.72; Potassium 4.8; Sodium 134   Lipid Panel    Component Value Date/Time   CHOL 189 02/16/2022 0726   TRIG 58 02/16/2022 0726   HDL 83 02/16/2022 0726   CHOLHDL 2.3 02/16/2022 0726   LDLCALC 95 02/16/2022 0726     Other studies Reviewed: Additional studies/ records that were reviewed today with results demonstrating: labs reviewed.   ASSESSMENT AND PLAN:  Aortic insufficiency: No CHF sx.  Appears euvolemic.  SVT: No prolonged palpitations.   Hyperlipidemia/aortic atherosclerosis: LDL 95 in 4/23.  Lipids from today are pending.  She inquired about LP(a).  We discussed checking it but she is not wanting to take a statin.  Therefore, I do not think the test will change management so we will not check. Coronary artery calcification: No angina, even with reular exercise.  No longer taking Imdur due to lack of symptoms.  LBBB: chronic.   Current medicines are reviewed at  length with the patient today.  The patient concerns regarding her medicines were addressed.  The following changes have been made:  No change  Labs/ tests ordered today include:  No orders of the defined types were placed in this encounter.   Recommend 150 minutes/week of aerobic exercise Low fat, low carb, high fiber diet recommended  Disposition:   FU in 1 year   Signed, Larae Grooms, MD  11/10/2022 9:57 AM    [facility] [address], [place], [place]  [address] Phone: [phone]; Fax: [phone]

## 2022-11-10 ENCOUNTER — Ambulatory Visit: Payer: Medicare Other

## 2022-11-10 ENCOUNTER — Ambulatory Visit: Payer: Medicare Other | Attending: Interventional Cardiology | Admitting: Interventional Cardiology

## 2022-11-10 ENCOUNTER — Encounter: Payer: Self-pay | Admitting: Interventional Cardiology

## 2022-11-10 VITALS — BP 108/78 | HR 83 | Ht 66.0 in | Wt 105.2 lb

## 2022-11-10 DIAGNOSIS — I471 Supraventricular tachycardia, unspecified: Secondary | ICD-10-CM | POA: Insufficient documentation

## 2022-11-10 DIAGNOSIS — I351 Nonrheumatic aortic (valve) insufficiency: Secondary | ICD-10-CM | POA: Diagnosis not present

## 2022-11-10 DIAGNOSIS — I700 Atherosclerosis of aorta: Secondary | ICD-10-CM

## 2022-11-10 DIAGNOSIS — E785 Hyperlipidemia, unspecified: Secondary | ICD-10-CM

## 2022-11-10 DIAGNOSIS — I2510 Atherosclerotic heart disease of native coronary artery without angina pectoris: Secondary | ICD-10-CM | POA: Diagnosis not present

## 2022-11-10 DIAGNOSIS — I517 Cardiomegaly: Secondary | ICD-10-CM | POA: Insufficient documentation

## 2022-11-10 DIAGNOSIS — I447 Left bundle-branch block, unspecified: Secondary | ICD-10-CM | POA: Insufficient documentation

## 2022-11-10 LAB — HEPATIC FUNCTION PANEL
ALT: 16 IU/L (ref 0–32)
AST: 19 IU/L (ref 0–40)
Albumin: 4.5 g/dL (ref 3.7–4.7)
Alkaline Phosphatase: 67 IU/L (ref 44–121)
Bilirubin Total: 0.6 mg/dL (ref 0.0–1.2)
Bilirubin, Direct: 0.19 mg/dL (ref 0.00–0.40)
Total Protein: 6.5 g/dL (ref 6.0–8.5)

## 2022-11-10 LAB — LIPID PANEL
Chol/HDL Ratio: 2.2 ratio (ref 0.0–4.4)
Cholesterol, Total: 184 mg/dL (ref 100–199)
HDL: 83 mg/dL (ref >39)
LDL Chol Calc (NIH): 90 mg/dL (ref 0–99)
Triglycerides: 60 mg/dL (ref 0–149)
VLDL Cholesterol Cal: 11 mg/dL (ref 5–40)

## 2022-11-10 NOTE — Patient Instructions (Signed)

## 2022-11-13 ENCOUNTER — Encounter: Payer: Self-pay | Admitting: Interventional Cardiology

## 2022-11-17 ENCOUNTER — Encounter: Payer: Self-pay | Admitting: Interventional Cardiology

## 2022-11-24 DIAGNOSIS — K219 Gastro-esophageal reflux disease without esophagitis: Secondary | ICD-10-CM | POA: Diagnosis not present

## 2022-11-24 DIAGNOSIS — E559 Vitamin D deficiency, unspecified: Secondary | ICD-10-CM | POA: Diagnosis not present

## 2022-11-24 DIAGNOSIS — K909 Intestinal malabsorption, unspecified: Secondary | ICD-10-CM | POA: Diagnosis not present

## 2022-11-24 DIAGNOSIS — R079 Chest pain, unspecified: Secondary | ICD-10-CM | POA: Diagnosis not present

## 2022-11-24 DIAGNOSIS — Z79899 Other long term (current) drug therapy: Secondary | ICD-10-CM | POA: Diagnosis not present

## 2022-11-24 DIAGNOSIS — M810 Age-related osteoporosis without current pathological fracture: Secondary | ICD-10-CM | POA: Diagnosis not present

## 2022-11-24 DIAGNOSIS — E7800 Pure hypercholesterolemia, unspecified: Secondary | ICD-10-CM | POA: Diagnosis not present

## 2022-11-24 DIAGNOSIS — I700 Atherosclerosis of aorta: Secondary | ICD-10-CM | POA: Diagnosis not present

## 2022-11-30 DIAGNOSIS — Z23 Encounter for immunization: Secondary | ICD-10-CM | POA: Diagnosis not present

## 2022-12-04 ENCOUNTER — Encounter: Payer: Self-pay | Admitting: Interventional Cardiology

## 2022-12-06 NOTE — Progress Notes (Unsigned)
   I, Peterson Lombard, LAT, ATC acting as a scribe for Lynne Leader, MD.  Subjective:    CC: Back pain  HPI: Pt is an 87 y/o female c/o back pain x /. Pt locates pain  Radiating pain: LE numbness/tingling: LE weakness: Aggravates: Treatments tried:  Pertinent review of Systems: ***  Relevant historical information: ***   Objective:   There were no vitals filed for this visit. General: Well Developed, well nourished, and in no acute distress.   MSK: ***  Lab and Radiology Results No results found for this or any previous visit (from the past 72 hour(s)). No results found.    Impression and Recommendations:    Assessment and Plan: 87 y.o. female with ***.  PDMP not reviewed this encounter. No orders of the defined types were placed in this encounter.  No orders of the defined types were placed in this encounter.   Discussed warning signs or symptoms. Please see discharge instructions. Patient expresses understanding.   ***

## 2022-12-07 ENCOUNTER — Encounter: Payer: Self-pay | Admitting: Interventional Cardiology

## 2022-12-07 ENCOUNTER — Ambulatory Visit (INDEPENDENT_AMBULATORY_CARE_PROVIDER_SITE_OTHER): Payer: Medicare Other

## 2022-12-07 ENCOUNTER — Ambulatory Visit (INDEPENDENT_AMBULATORY_CARE_PROVIDER_SITE_OTHER): Payer: Medicare Other | Admitting: Family Medicine

## 2022-12-07 ENCOUNTER — Encounter: Payer: Self-pay | Admitting: Family Medicine

## 2022-12-07 VITALS — BP 128/74 | HR 97 | Ht 66.0 in | Wt 110.0 lb

## 2022-12-07 DIAGNOSIS — S32010A Wedge compression fracture of first lumbar vertebra, initial encounter for closed fracture: Secondary | ICD-10-CM | POA: Diagnosis not present

## 2022-12-07 DIAGNOSIS — M545 Low back pain, unspecified: Secondary | ICD-10-CM | POA: Diagnosis not present

## 2022-12-07 DIAGNOSIS — I2510 Atherosclerotic heart disease of native coronary artery without angina pectoris: Secondary | ICD-10-CM

## 2022-12-07 MED ORDER — TIZANIDINE HCL 4 MG PO TABS: 2.0000 mg | ORAL_TABLET | Freq: Every evening | ORAL | 1 refills | Status: DC | PRN

## 2022-12-07 NOTE — Patient Instructions (Addendum)
Thank you for coming in today.   Please get an Xray today before you leave   I've referred you to Physical Therapy.  Let us know if you don't hear from them in one week.   Ok to try tizanidine at bedtime.  It will make you sleepy.   Use a heating pad.   Recheck in 6 weeks.   Let me know if this is not working or if you are getting worse.

## 2022-12-08 ENCOUNTER — Encounter: Payer: Self-pay | Admitting: Family Medicine

## 2022-12-08 NOTE — Progress Notes (Signed)
As we spoke on the phone yesterday lumbar spine x-ray shows a compression fracture at L1.  I am planning an MRI to further evaluate this and plan for kyphoplasty.

## 2022-12-09 ENCOUNTER — Encounter: Payer: Self-pay | Admitting: Family Medicine

## 2022-12-12 ENCOUNTER — Ambulatory Visit (INDEPENDENT_AMBULATORY_CARE_PROVIDER_SITE_OTHER): Payer: Medicare Other

## 2022-12-12 DIAGNOSIS — M545 Low back pain, unspecified: Secondary | ICD-10-CM | POA: Diagnosis not present

## 2022-12-12 DIAGNOSIS — M47816 Spondylosis without myelopathy or radiculopathy, lumbar region: Secondary | ICD-10-CM | POA: Diagnosis not present

## 2022-12-12 DIAGNOSIS — S32010A Wedge compression fracture of first lumbar vertebra, initial encounter for closed fracture: Secondary | ICD-10-CM

## 2022-12-12 DIAGNOSIS — M4316 Spondylolisthesis, lumbar region: Secondary | ICD-10-CM | POA: Diagnosis not present

## 2022-12-13 ENCOUNTER — Other Ambulatory Visit: Payer: Self-pay | Admitting: Family Medicine

## 2022-12-13 ENCOUNTER — Telehealth: Payer: Self-pay | Admitting: Family Medicine

## 2022-12-13 ENCOUNTER — Encounter: Payer: Self-pay | Admitting: Family Medicine

## 2022-12-13 DIAGNOSIS — S32010A Wedge compression fracture of first lumbar vertebra, initial encounter for closed fracture: Secondary | ICD-10-CM

## 2022-12-13 NOTE — Telephone Encounter (Signed)
Rollen Sox, [facility]  to Me     12/13/22  3:50 PM Advil recommendations would be the same as the Aleve.  Recommend she reach out to ortho for appropriate pain relief options

## 2022-12-13 NOTE — Telephone Encounter (Signed)
Refer to interventional radiology.

## 2022-12-13 NOTE — Progress Notes (Signed)
MRI confirms a vertebral compression fracture at L1 likely saw on the x-ray.  It looks to be new or acute should be treatable with kyphoplasty or vertebroplasty.  I have just referred you to the interventional radiology clinic at [facility].  You should hear soon about scheduling with them.  Typically will meet with the radiologist for an office type visit and I will talk to you about treatment options.  You should hear from them soon.

## 2022-12-14 ENCOUNTER — Encounter: Payer: Self-pay | Admitting: Family Medicine

## 2022-12-14 NOTE — Telephone Encounter (Signed)
Multiple MyChart correspondence over the last less than 7 days occupying a total of greater than 21 minutes of physician time.

## 2022-12-18 ENCOUNTER — Encounter: Payer: Self-pay | Admitting: Family Medicine

## 2022-12-19 ENCOUNTER — Ambulatory Visit
Admission: RE | Admit: 2022-12-19 | Discharge: 2022-12-19 | Disposition: A | Discharge status: Home / Self Care | Payer: Medicare Other | Source: Ambulatory Visit | Attending: Family Medicine | Admitting: Family Medicine

## 2022-12-19 ENCOUNTER — Encounter: Payer: Self-pay | Admitting: Family Medicine

## 2022-12-19 ENCOUNTER — Other Ambulatory Visit: Payer: Self-pay | Admitting: Family Medicine

## 2022-12-19 DIAGNOSIS — S32010A Wedge compression fracture of first lumbar vertebra, initial encounter for closed fracture: Secondary | ICD-10-CM

## 2022-12-19 DIAGNOSIS — M8008XA Age-related osteoporosis with current pathological fracture, vertebra(e), initial encounter for fracture: Secondary | ICD-10-CM

## 2022-12-19 HISTORY — PX: IR RADIOLOGIST EVAL & MGMT: IMG5224

## 2022-12-19 NOTE — Consult Note (Signed)
Chief Complaint: Patient was seen in consultation today for painful L1 compression fracture at the request of Blue [place] S  Referring Physician(s): Corey,Evan S  History of Present Illness: Julia [name] is a 87 y.o. female Who was in her usual state of relatively good health until she woke up the morning of Sunday, 4 February with significant back pain.  She had terrible pain getting out of bed.  Initial conservative therapy failed to relieve her symptoms.  An MRI was obtained the following week on February 13 which demonstrated an acute/subacute L1 vertebral body compression fracture with marrow edema and approximately 50% height loss.  Her pain is quite severe.  She rates it an 8-10 out of 10 on a 10 point scale.  She is taking over-the-counter pain medicine up to 3 times daily without relief.  Her pain is worse when she tries to sit for long periods of time or walk.  Her she finds her pain quite debilitating.  She scored an 18 out of 24 on the [employer] disability questionnaire.  She denies lower extremity weakness, paresthesias, or changes in bowel or bladder function.  Past Medical History:  Diagnosis Date   Aortic insufficiency    a. mild by echo 2016. // Echo 06/2022: EF 50-55, no RWMA, septal movement consistent with LBBB, GR 1 DD, normal RVSF, small pericardial effusion, mild MR, mild AI, AV sclerosis without stenosis, RA pressure 3   Chest pain    Nuclear March, 2011, normal, ejection fraction 64%   Coronary artery disease involving native coronary artery of native heart without angina pectoris 07/11/2022   CCTA 9/23: CAC score 158 (49th percentile); non-obstructive CAD (mLAD 25-49, pOM1 25-49, pRCA 1-24, PLV 1-24); small pericardial effusion   Dyslipidemia    Elevated blood pressure reading    GERD (gastroesophageal reflux disease)    Patient sometimes has slight right lower jaw discomfort at the time of her reflux   LBBB (left bundle branch block)    Pericardial  effusion    Echo December, 2009, small, incidental   Shoulder pain    Left scapular pain, appeared to be radicular    Past Surgical History:  Procedure Laterality Date   25 GAUGE PARS PLANA VITRECTOMY WITH 20 GAUGE MVR PORT FOR MACULAR HOLE  2000   BREAST BIOPSY  1983   benign   CHOLECYSTECTOMY  1984   IR RADIOLOGIST EVAL & MGMT  12/19/2022   MELANOMA EXCISION  2012, 2013   [place]  2002    Allergies: Pravastatin and Trazodone hcl  Medications: Prior to Admission medications   Medication Sig Start Date End Date Taking? Authorizing Provider  acetaminophen (TYLENOL) 500 MG tablet Take 500 mg by mouth every 6 (six) hours as needed for moderate pain or headache.   Yes [provider]  aspirin 81 MG chewable tablet Chew 81 mg by mouth daily.   Yes [provider]  carboxymethylcellulose (REFRESH PLUS) 0.5 % SOLN Place 1 drop into both eyes 3 (three) times daily as needed (dry eyes).   Yes [provider]  Cholecalciferol (VITAMIN D3) 2000 UNITS capsule Take 2,000 Units by mouth daily.   Yes [provider]  COLLAGEN PO Take 5 mLs by mouth daily.   Yes [provider]  ezetimibe (ZETIA) 10 MG tablet Take 1 tablet (10 mg total) by mouth daily. 07/31/22  Yes Weaver, Scott T, PA-C  famotidine (PEPCID) 10 MG tablet Take 10  mg by mouth 2 (two) times daily.   Yes [provider]  Multiple Vitamin (MULTIVITAMIN) tablet Take 1 tablet by mouth daily.   Yes [provider]  nitroGLYCERIN (NITROSTAT) 0.4 MG SL tablet Place 0.4 mg under the tongue every 5 (five) minutes as needed for chest pain. 07/05/22  Yes [provider]  PREVIDENT 5000 BOOSTER PLUS 1.1 % PSTE Place 1 application onto teeth at bedtime. 03/17/21  Yes [provider]  Probiotic Product (PROBIOTIC PO) Take 1 capsule by mouth daily.   Yes [provider]  Sodium Chloride-Xylitol (XLEAR SINUS CARE SPRAY NA)  Place 1 spray into the nose daily as needed (sinus congestion).   Yes [provider]  THEANINE PO Take 25-100 mg by mouth daily as needed (sleep & stress).   Yes [provider]  tiZANidine (ZANAFLEX) 4 MG tablet Take 0.5-1 tablets (2-4 mg total) by mouth at bedtime as needed for muscle spasms. 12/07/22  Yes Gregor Hams, MD  VYZULTA 0.024 % SOLN Place 1 drop into the right eye at bedtime. 04/05/21  Yes [provider]     Family History  Problem Relation Age of Onset   Emphysema Father    Pneumonia Father    COPD Father    Heart murmur Mother    Pneumonia Mother    Heart attack Neg Hx    Hypertension Neg Hx    Stroke Neg Hx     Social History   Socioeconomic History   Marital status: Married    Spouse name: Not on file   Number of children: Not on file   Years of education: Not on file   Highest education level: Not on file  Occupational History   Occupation: Retired  Tobacco Use   Smoking status: Never   Smokeless tobacco: Never  Vaping Use   Vaping Use: Never used  Substance and Sexual Activity   Alcohol use: Yes   Drug use: No   Sexual activity: Not on file  Other Topics Concern   Not on file  Social History Narrative   Not on file   Social Determinants of Health   Financial Resource Strain: Not on file  Food Insecurity: Not on file  Transportation Needs: Not on file  Physical Activity: Not on file  Stress: Not on file  Social Connections: Not on file     Review of Systems: A 12 point ROS discussed and pertinent positives are indicated in the HPI above.  All other systems are negative.  Review of Systems  Vital Signs: BP (!) 172/98 (BP Location: Left Arm, Patient Position: Sitting, Cuff Size: Normal)   Pulse 96   Temp 98.3 F (36.8 C) (Oral)   Resp 14   Ht 5' 6"$  (1.676 m)   Wt 47.6 kg   SpO2 96%   BMI 16.95 kg/m      Physical Exam Constitutional:      General: She is not in acute distress.    Appearance: Normal  appearance. She is normal weight.  HENT:     Head: Normocephalic and atraumatic.  Eyes:     General: No scleral icterus. Cardiovascular:     Rate and Rhythm: Normal rate.  Pulmonary:     Effort: Pulmonary effort is normal.  Abdominal:     General: Abdomen is flat. There is no distension.     Palpations: Abdomen is soft.  Musculoskeletal:       Back:     Comments: Only mild TTP  at L1 but tender lower down and in the paraspinal muscles   Skin:    General: Skin is warm and dry.  Neurological:     Mental Status: She is alert and oriented to person, place, and time.  Psychiatric:        Mood and Affect: Mood normal.        Behavior: Behavior normal.         Imaging: IR Radiologist Eval & Mgmt  Result Date: 12/19/2022 EXAM: NEW PATIENT OFFICE VISIT CHIEF COMPLAINT: SEE EPIC NOTE HISTORY OF PRESENT ILLNESS: SEE EPIC NOTE REVIEW OF SYSTEMS: SEE EPIC NOTE PHYSICAL EXAMINATION: SEE EPIC NOTE ASSESSMENT AND PLAN: SEE EPIC NOTE Electronically Signed   By: Jacqulynn Cadet M.D.   On: 12/19/2022 15:30   MR Lumbar Spine Wo Contrast  Result Date: 12/12/2022 CLINICAL DATA:  Severe low back pain for 2 weeks.  No known injury. EXAM: MRI LUMBAR SPINE WITHOUT CONTRAST TECHNIQUE: Multiplanar, multisequence MR imaging of the lumbar spine was performed. No intravenous contrast was administered. COMPARISON:  None Available. FINDINGS: Segmentation:  Standard. Alignment: Minimal retrolisthesis of L2 on L3 and L3 on L4. Minimal grade 1 anterolisthesis of L5 on S1. Vertebrae: No aggressive osseous lesion. No discitis or osteomyelitis. Acute-subacute L1 vertebral body compression fracture with marrow edema within the vertebral body, 4 mm retropulsion of the superior posterior margin of the L1 vertebral body, and approximately 50% height loss. Conus medullaris and cauda equina: Conus extends to the L1 level. Conus and cauda equina appear normal. Paraspinal and other soft tissues: No acute paraspinal  abnormality. Disc levels: Disc spaces: Degenerative disease with disc height loss at T11-12, T12-L1, L1-2, L2-3 and L3-4. T11-12: Broad-based disc bulge. Mild bilateral facet arthropathy. No foraminal or central canal stenosis. T12-L1: Mild broad-based disc bulge. Mild bilateral facet arthropathy. No foraminal or central canal stenosis. L1-L2: Broad-based disc bulge. Mild bilateral facet arthropathy. Mild spinal stenosis. Mild bilateral foraminal stenosis. L2-L3: Broad-based disc bulge. Mild bilateral facet arthropathy. Mild spinal stenosis. No foraminal stenosis. L3-L4: Broad-based disc bulge. Mild bilateral facet arthropathy. Mild-moderate spinal stenosis. Moderate bilateral foraminal stenosis. L4-L5: Broad-based disc bulge. Moderate bilateral facet arthropathy. Bilateral lateral recess stenosis. Mild spinal stenosis. Mild bilateral foraminal stenosis. L5-S1: Mild broad-based disc bulge. Moderate right and mild left facet arthropathy. No foraminal or central canal stenosis. IMPRESSION: 1. Acute-subacute L1 vertebral body compression fracture with marrow edema within the vertebral body, 4 mm retropulsion of the superior posterior margin of the L1 vertebral body, and approximately 50% height loss. 2. Diffuse lumbar spine spondylosis as described above. Electronically Signed   By: Kathreen Devoid M.D.   On: 12/12/2022 14:24   DG Lumbar Spine 2-3 Views  Result Date: 12/07/2022 CLINICAL DATA:  Low back pain for 1 week, no known injury EXAM: LUMBAR SPINE - 2-3 VIEW COMPARISON:  CT abdomen and pelvis 01/15/2019 FINDINGS: Osseous demineralization. Five non-rib-bearing lumbar vertebra. Multilevel facet degenerative changes. Multilevel disc space narrowing and endplate spur formation. Probable mild superior endplate compression fracture of L1, new since 2020. No additional fracture, subluxation, or bone destruction. SI joints preserved. IMPRESSION: Mild superior endplate compression deformity of L1 vertebral body new since  2020. Osseous demineralization with multilevel degenerative disc and facet disease changes lumbar spine. Electronically Signed   By: Lavonia Dana M.D.   On: 12/07/2022 14:42    Labs:  CBC: No results for input(s): "WBC", "HGB", "HCT", "PLT" in the last 8760 hours.  COAGS: No results for input(s): "INR", "APTT" in the last  8760 hours.  BMP: Recent Labs    07/11/22 1127  NA 134  K 4.8  CL 96  CO2 22  GLUCOSE 95  BUN 15  CALCIUM 9.7  CREATININE 0.72    LIVER FUNCTION TESTS: Recent Labs    02/16/22 0726 11/10/22 0916  BILITOT 0.6 0.6  AST 18 19  ALT 14 16  ALKPHOS 77 67  PROT 6.4 6.5  ALBUMIN 4.5 4.5    TUMOR MARKERS: No results for input(s): "AFPTM", "CEA", "CA199", "CHROMGRNA" in the last 8760 hours.  Assessment & Plan:   Patient has suffered subacute osteoporotic fracture of the L1 vertebra.   History and exam have demonstrated the following:  Acute/Subacute fracture by imaging dated 12/12/22, Failure of conservative therapy and pain refractory to narcotic pain mediation, and Significant disability on the [place] with 18/24 positive symptoms, reflecting significant impact/impairment of (ADLs)   ICD-10-CM Codes that Support Medical Necessity ([email or web address])  M80.08XA    Age-related osteoporosis with current pathological fracture, vertebra(e), initial encounter for fracture and S32.010A    Wedge compression fracture of first lumbar vertebra, initial encounter for closed fracture    Plan:  L1 vertebral body augmentation with balloon kyphoplasty  Post-procedure disposition: outpatient [facility]  Medication holds: None  The patient has suffered a fracture of the L1 vertebral body. It is recommended that patients aged 46 years or older be evaluated for possible testing or treatment of osteoporosis. A copy of this consult report is sent to the patient's referring physician.      Total time spent on today's visit was over 24 Minutes including both face-to-face time and non face-to-face time, personally spent on review of chart (including labs and relevant imaging), discussing further workup and treatment options, referral to specialist if needed, reviewing outside records if pertinent, answering patient questions, and coordinating care regarding osteoporotic fracture of L1 as well as management strategy.    Electronically Signed: Criselda Peaches 12/19/2022, 3:34 PM

## 2022-12-21 ENCOUNTER — Ambulatory Visit
Admission: RE | Admit: 2022-12-21 | Discharge: 2022-12-21 | Disposition: A | Discharge status: Home / Self Care | Payer: PRIVATE HEALTH INSURANCE | Source: Ambulatory Visit | Attending: Family Medicine | Admitting: Family Medicine

## 2022-12-21 DIAGNOSIS — M8008XA Age-related osteoporosis with current pathological fracture, vertebra(e), initial encounter for fracture: Secondary | ICD-10-CM

## 2022-12-21 HISTORY — PX: IR KYPHO LUMBAR INC FX REDUCE BONE BX UNI/BIL CANNULATION INC/IMAGING: IMG5519

## 2022-12-21 MED ORDER — ACETAMINOPHEN 10 MG/ML IV SOLN
1000.0000 mg | Freq: Once | INTRAVENOUS | Status: AC
  Administered 2022-12-21: 1000 mg via INTRAVENOUS

## 2022-12-21 MED ORDER — MIDAZOLAM HCL 2 MG/2ML IJ SOLN
1.0000 mg | INTRAMUSCULAR | Status: DC | PRN
  Administered 2022-12-21 (×2): 1 mg via INTRAVENOUS

## 2022-12-21 MED ORDER — FENTANYL CITRATE PF 50 MCG/ML IJ SOSY
25.0000 ug | PREFILLED_SYRINGE | INTRAMUSCULAR | Status: DC | PRN
  Administered 2022-12-21 (×2): 25 ug via INTRAVENOUS

## 2022-12-21 MED ORDER — SODIUM CHLORIDE 0.9 % IV SOLN: INTRAVENOUS | Status: DC

## 2022-12-21 MED ORDER — CEFAZOLIN SODIUM-DEXTROSE 2-4 GM/100ML-% IV SOLN
2.0000 g | INTRAVENOUS | Status: AC
  Administered 2022-12-21: 2 g via INTRAVENOUS

## 2022-12-21 MED ORDER — KETOROLAC TROMETHAMINE 30 MG/ML IJ SOLN
30.0000 mg | Freq: Once | INTRAMUSCULAR | Status: AC
  Administered 2022-12-21: 30 mg via INTRAVENOUS

## 2022-12-21 NOTE — Discharge Instructions (Signed)
Kyphoplasty Post Procedure Discharge Instructions  May resume a regular diet and any medications that you routinely take (including pain medications). However, if you are taking Aspirin or an anticoagulant/blood thinner you will be told when you can resume taking these by the healthcare provider. No driving day of procedure. The day of your procedure take it easy. You may use an ice pack as needed to injection sites on back.  Ice to back 30 minutes on and 30 minutes off, as needed. May remove bandaids tomorrow after taking a shower. Replace daily with a clean bandaid until healed.  Do not lift anything heavier than a milk jug for 1-2 weeks or determined by your physician.  Follow up with your physician in 2 weeks.    Please contact our office at [phone] for the following symptoms or if you have any questions:  Fever greater than 100 degrees Increased swelling, pain, or redness at injection site. Increased back and/or leg pain New numbness or change in symptoms from before the procedure.    Thank you for visiting [facility].  May resume aspirin immediately after procedure.

## 2022-12-21 NOTE — Progress Notes (Signed)
Pt back in nursing recovery area. Pt still drowsy from procedure but will wake up when spoken to. Pt follows commands, talks in complete sentences and has no complaints at this time. Pt will remain in nursing station until discharge.  ?

## 2022-12-27 ENCOUNTER — Telehealth: Payer: Self-pay

## 2022-12-27 NOTE — Telephone Encounter (Signed)
Phone call to pt to follow up from her kyphoplasty on 12/21/22. Pt reports her pain is completely gone post procedure but is still having pain but describes it as soreness. Pt reports she is able to move around a little better. Pt denies any signs of infection, redness at the site, draining or fever. Pt has no complaints at this time and will be scheduled for a telephone follow up with Dr. Laurence Ferrari next week. Pt advised to call back if anything were to change or any concerns arise and we will arrange an in person appointment. Pt verbalized understanding.

## 2022-12-28 ENCOUNTER — Other Ambulatory Visit: Payer: Self-pay | Admitting: Interventional Radiology

## 2022-12-28 DIAGNOSIS — M8008XA Age-related osteoporosis with current pathological fracture, vertebra(e), initial encounter for fracture: Secondary | ICD-10-CM

## 2022-12-31 ENCOUNTER — Encounter: Payer: Self-pay | Admitting: Family Medicine

## 2023-01-01 NOTE — Telephone Encounter (Signed)
I called Julia [name].  She is feeling a little better than she was yesterday. No neuro symptoms.,  I scheduled her an appointment with me on March 5th at 11am

## 2023-01-02 ENCOUNTER — Telehealth: Payer: Self-pay

## 2023-01-02 ENCOUNTER — Ambulatory Visit (INDEPENDENT_AMBULATORY_CARE_PROVIDER_SITE_OTHER): Payer: Medicare Other

## 2023-01-02 ENCOUNTER — Ambulatory Visit (INDEPENDENT_AMBULATORY_CARE_PROVIDER_SITE_OTHER): Payer: Medicare Other | Admitting: Family Medicine

## 2023-01-02 ENCOUNTER — Encounter: Payer: Self-pay | Admitting: Family Medicine

## 2023-01-02 VITALS — BP 140/88 | HR 79 | Ht 66.0 in | Wt 105.6 lb

## 2023-01-02 DIAGNOSIS — M545 Low back pain, unspecified: Secondary | ICD-10-CM

## 2023-01-02 DIAGNOSIS — M8000XD Age-related osteoporosis with current pathological fracture, unspecified site, subsequent encounter for fracture with routine healing: Secondary | ICD-10-CM | POA: Diagnosis not present

## 2023-01-02 DIAGNOSIS — I2510 Atherosclerotic heart disease of native coronary artery without angina pectoris: Secondary | ICD-10-CM

## 2023-01-02 DIAGNOSIS — M810 Age-related osteoporosis without current pathological fracture: Secondary | ICD-10-CM | POA: Insufficient documentation

## 2023-01-02 NOTE — Telephone Encounter (Signed)
VOB initiated for Prolia for OP.

## 2023-01-02 NOTE — Progress Notes (Signed)
I, Peterson Lombard, LAT, ATC acting as a scribe for Lynne Leader, MD.  Julia [name] is a 87 y.o. female who presents to [place] at [facility] today for f/u after kyphoplasty and new area of back pain. Pt was last seen by Dr. Georgina Snell on 12/07/22 w/ XR revealing a compression fx at L1. L-spine MRI was ordered and kyphoplasty was preformed on 12/21/22. Today, pt reports post surgical pain in the lower back. Pain wraps around into the hips. Pain at the SI joints, constant. Has tried Tylenol with no relief. Has tried heat/ice. Has concerns about compression fracture in the lumbar spine.   Dx imaging: 12/12/22 L-spine MRI  12/07/22 L-spine XR   Pertinent review of systems: No fevers or chills  Relevant historical information: Coronary artery disease Osteoporosis   Exam:  BP (!) 140/88   Pulse 79   Ht '5\' 6"'$  (1.676 m)   Wt 105 lb 9.6 oz (47.9 kg)   SpO2 96%   BMI 17.04 kg/m  General: Well Developed, well nourished, and in no acute distress.   MSK: L-spine: Normal appearing Nontender palpation spinal midline.  Tender palpation lumbar paraspinal musculature.  Paraspinal musculature is rigid to palpation. Decreased lumbar motion.    Lab and Radiology Results  X-ray images lumbar spine obtained today personally and independently interpreted. Kyphoplasty at L1 is visible.  No new fractures are visible. Await formal radiology review   Assessment and Plan: 87 y.o. female with new or exacerbation of low back pain following kyphoplasty.  Today's pain I think is due to muscle spasm and dysfunction.  That may have been the pain that actually brought her into my clinic in early February.  I am not sure how much of her pain was due to the compression fracture at L1 seen on x-ray and subsequent lumbar spine MRI.  Will treat today's pain with physical therapy.  Physical therapy should be gentle not to further stress the spine but at this point she is safe for PT in my  opinion.  Extensive discussion and review of imaging and imaging pictures of her lumbar spine x-rays and MRIs and kyphoplasty.  Answered questions.  Total encounter time 30 minutes including face-to-face time with the patient and, reviewing past medical record, and charting on the date of service.    We also talked about osteoporosis.  She has significant osteoporosis based on a DEXA scan.  She has completed 6-year course of Fosamax in the past.  Will work on authorization for [employer].  Based on her CAD history Prolia may be a better choice.  PDMP not reviewed this encounter. Orders Placed This Encounter  Procedures   DG Lumbar Spine 2-3 Views    Standing Status:   Future    Number of Occurrences:   1    Standing Expiration Date:   02/02/2023    Order Specific Question:   Reason for Exam (SYMPTOM  OR DIAGNOSIS REQUIRED)    Answer:   low back pain s/p kyphoplasty    Order Specific Question:   Preferred imaging location?    Answer:   [facility]   Ambulatory referral to Physical Therapy    Referral Priority:   Routine    Referral Type:   Physical Medicine    Referral Reason:   Specialty Services Required    Requested Specialty:   Physical Therapy    Number of Visits Requested:   1   No orders of the defined types  were placed in this encounter.    Discussed warning signs or symptoms. Please see discharge instructions. Patient expresses understanding.   The above documentation has been reviewed and is accurate and complete Lynne Leader, M.D.

## 2023-01-02 NOTE — Patient Instructions (Addendum)
Thank you for coming in today.   Please get an Xray today before you leave   I've referred you to Physical Therapy.  Let us know if you don't hear from them in one week.   Recheck with me in 1 month   We will work on [employer] for osteoperosis for bone density.

## 2023-01-02 NOTE — Telephone Encounter (Signed)
Check benefits for Prolia and Even[employer]  DEXA done with [facility], brought a copy of report to today's visit.

## 2023-01-03 ENCOUNTER — Telehealth: Payer: Self-pay | Admitting: Family Medicine

## 2023-01-03 NOTE — Progress Notes (Signed)
Lumbar spine x-ray shows where you had your kyphoplasty otherwise it has not changed.  No new fractures.

## 2023-01-03 NOTE — Telephone Encounter (Signed)
Spoke to the patient. She asked if there was anything that she could do that may help her pain?

## 2023-01-03 NOTE — Telephone Encounter (Signed)
Spoke to patient. She would like to hold off at this point. She will contact us when she is ready.

## 2023-01-03 NOTE — Telephone Encounter (Signed)
Pt ready for scheduling on or after 01/03/23  Out-of-pocket cost due at time of visit: $0  Primary: Medicare Prolia co-insurance: 20% (approximately $302) Admin fee co-insurance: 20% (approximately $25)  Deductible: $240 of $240 met  Secondary: AARP Medicare Supp Prolia co-insurance: Covers Medicare Part B co-insurance Admin fee co-insurance: Covers Medicare Part B co-insurance  Deductible:  Covered by secondary  Prior Auth: NOT required PA# Valid:   ** This summary of benefits is an estimation of the patient's out-of-pocket cost. Exact cost may vary based on individual plan coverage.

## 2023-01-04 ENCOUNTER — Ambulatory Visit
Admission: RE | Admit: 2023-01-04 | Discharge: 2023-01-04 | Disposition: A | Discharge status: Home / Self Care | Payer: Medicare Other | Source: Ambulatory Visit | Attending: Interventional Radiology | Admitting: Interventional Radiology

## 2023-01-04 DIAGNOSIS — S32010D Wedge compression fracture of first lumbar vertebra, subsequent encounter for fracture with routine healing: Secondary | ICD-10-CM | POA: Diagnosis not present

## 2023-01-04 DIAGNOSIS — M8008XA Age-related osteoporosis with current pathological fracture, vertebra(e), initial encounter for fracture: Secondary | ICD-10-CM

## 2023-01-04 DIAGNOSIS — M8008XD Age-related osteoporosis with current pathological fracture, vertebra(e), subsequent encounter for fracture with routine healing: Secondary | ICD-10-CM | POA: Diagnosis not present

## 2023-01-04 HISTORY — PX: IR RADIOLOGIST EVAL & MGMT: IMG5224

## 2023-01-04 NOTE — Telephone Encounter (Signed)
Tylenol is the safest medicine you can take.  The tizanidine muscle relaxer that I prescribed in February can be used especially at bedtime.  Tizanidine is sedating so use with caution.  You can use ibuprofen or Aleve intermittently for pain.  However this medicines are not safe to use regularly.  Stronger medicines like tramadol or hydrocodone are both opiates and I can prescribe them if you need me to.  However they have side effects that are potentially addictive and can cause confusion and constipation.  Would you like me to prescribe you these stronger pain medicines?

## 2023-01-04 NOTE — Progress Notes (Signed)
Chief Complaint: Patient was consulted remotely today (TeleHealth) for L1 compression fracture, low back pain at the request of Teofila Bowery K.    Referring Physician(s): Joangel Vanosdol K  History of Present Illness: Julia [name] is a 87 y.o. female With a long history of low back pain recently exacerbated and who was found to have a subacute L1 compression fracture.  She underwent cement augmentation with balloon kyphoplasty on 12/21/2022.  I spoke to her over the phone today as well as her husband for her 2-week follow-up evaluation.  The kyphoplasty site has completely healed.  She has no pain or symptoms related to this location.  Unfortunately, she continues to have generalized pain in her lower back at the waistline.  Otherwise, she has no new symptoms.  Past Medical History:  Diagnosis Date   Aortic insufficiency    a. mild by echo 2016. // Echo 06/2022: EF 50-55, no RWMA, septal movement consistent with LBBB, GR 1 DD, normal RVSF, small pericardial effusion, mild MR, mild AI, AV sclerosis without stenosis, RA pressure 3   Chest pain    Nuclear March, 2011, normal, ejection fraction 64%   Coronary artery disease involving native coronary artery of native heart without angina pectoris 07/11/2022   CCTA 9/23: CAC score 158 (49th percentile); non-obstructive CAD (mLAD 25-49, pOM1 25-49, pRCA 1-24, PLV 1-24); small pericardial effusion   Dyslipidemia    Elevated blood pressure reading    GERD (gastroesophageal reflux disease)    Patient sometimes has slight right lower jaw discomfort at the time of her reflux   LBBB (left bundle branch block)    Pericardial effusion    Echo December, 2009, small, incidental   Shoulder pain    Left scapular pain, appeared to be radicular    Past Surgical History:  Procedure Laterality Date   25 GAUGE PARS PLANA VITRECTOMY WITH 20 GAUGE MVR PORT FOR MACULAR HOLE  2000   BREAST BIOPSY  1983   benign   CHOLECYSTECTOMY  1984   IR  KYPHO LUMBAR INC FX REDUCE BONE BX UNI/BIL CANNULATION INC/IMAGING  12/21/2022   IR RADIOLOGIST EVAL & MGMT  12/19/2022   IR RADIOLOGIST EVAL & MGMT  01/04/2023   MELANOMA EXCISION  2012, 2013   [place]burtis  2002    Allergies: Pravastatin and Trazodone hcl  Medications: Prior to Admission medications   Medication Sig Start Date End Date Taking? Authorizing Provider  acetaminophen (TYLENOL) 500 MG tablet Take 500 mg by mouth every 6 (six) hours as needed for moderate pain or headache.    [provider]  aspirin 81 MG chewable tablet Chew 81 mg by mouth daily.    [provider]  carboxymethylcellulose (REFRESH PLUS) 0.5 % SOLN Place 1 drop into both eyes 3 (three) times daily as needed (dry eyes).    [provider]  Cholecalciferol (VITAMIN D3) 2000 UNITS capsule Take 2,000 Units by mouth daily.    [provider]  COLLAGEN PO Take 5 mLs by mouth daily.    [provider]  ezetimibe (ZETIA) 10 MG tablet Take 1 tablet (10 mg total) by mouth daily. 07/31/22   Richardson Dopp T, PA-C  famotidine (PEPCID) 10 MG tablet Take 10 mg by mouth 2 (two) times daily.    [provider]  Multiple Vitamin (MULTIVITAMIN) tablet Take 1 tablet by mouth daily.    [provider]  nitroGLYCERIN (NITROSTAT) 0.4 MG SL tablet Place 0.4  mg under the tongue every 5 (five) minutes as needed for chest pain. 07/05/22   [provider]  PREVIDENT 5000 BOOSTER PLUS 1.1 % PSTE Place 1 application onto teeth at bedtime. 03/17/21   [provider]  Probiotic Product (PROBIOTIC PO) Take 1 capsule by mouth daily.    [provider]  Sodium Chloride-Xylitol (XLEAR SINUS CARE SPRAY NA) Place 1 spray into the nose daily as needed (sinus congestion).    [provider]  THEANINE PO Take 25-100 mg by mouth daily as needed (sleep & stress).    [provider]  tiZANidine (ZANAFLEX) 4 MG tablet  Take 0.5-1 tablets (2-4 mg total) by mouth at bedtime as needed for muscle spasms. 12/07/22   Gregor Hams, MD  VYZULTA 0.024 % SOLN Place 1 drop into the right eye at bedtime. 04/05/21   [provider]     Family History  Problem Relation Age of Onset   Emphysema Father    Pneumonia Father    COPD Father    Heart murmur Mother    Pneumonia Mother    Heart attack Neg Hx    Hypertension Neg Hx    Stroke Neg Hx     Social History   Socioeconomic History   Marital status: Married    Spouse name: Not on file   Number of children: Not on file   Years of education: Not on file   Highest education level: Not on file  Occupational History   Occupation: Retired  Tobacco Use   Smoking status: Never   Smokeless tobacco: Never  Vaping Use   Vaping Use: Never used  Substance and Sexual Activity   Alcohol use: Yes   Drug use: No   Sexual activity: Not on file  Other Topics Concern   Not on file  Social History Narrative   Not on file   Social Determinants of Health   Financial Resource Strain: Not on file  Food Insecurity: Not on file  Transportation Needs: Not on file  Physical Activity: Not on file  Stress: Not on file  Social Connections: Not on file    Review of Systems  Review of Systems: A 12 point ROS discussed and pertinent positives are indicated in the HPI above.  All other systems are negative.   Physical Exam No direct physical exam was performed (except for noted visual exam findings with Video Visits).   Vital Signs: There were no vitals taken for this visit.  Imaging: IR Radiologist Eval & Mgmt  Result Date: 01/04/2023 EXAM: ESTABLISHED PATIENT OFFICE VISIT CHIEF COMPLAINT: SEE EPIC NOTE HISTORY OF PRESENT ILLNESS: SEE EPIC NOTE REVIEW OF SYSTEMS: SEE EPIC NOTE PHYSICAL EXAMINATION: SEE EPIC NOTE ASSESSMENT AND PLAN: SEE EPIC NOTE Electronically Signed   By: Jacqulynn Cadet M.D.   On: 01/04/2023 15:55   DG Lumbar Spine 2-3 Views  Result  Date: 01/03/2023 CLINICAL DATA:  Low back pain after kyphoplasty. EXAM: LUMBAR SPINE - 2-3 VIEW COMPARISON:  December 07, 2022 FINDINGS: The patient is status post kyphoplasty at L1. The L1 vertebral body height is 18 mm centrally today, not significantly changed since the December 12, 2022 MRI. There is mild kyphosis centered at this level. Grade 1 retrolisthesis of L2 versus L3 and L3 versus L4. No other malalignment. Multilevel degenerative disc disease and lower lumbar facet degenerative changes. No other interval changes. IMPRESSION: 1. Status post kyphoplasty at L1. The L1 vertebral body height is 18 mm centrally today, not significantly  changed since the December 12, 2022 MRI. 2. Degenerative changes as above. Electronically Signed   By: Dorise Bullion III M.D.   On: 01/03/2023 10:23   IR KYPHO LUMBAR INC FX REDUCE BONE BX UNI/BIL CANNULATION INC/IMAGING  Result Date: 12/21/2022 CLINICAL DATA:  87 year old female with highly symptomatic subacute L1 compression fracture. She presents for cement augmentation with balloon kyphoplasty. M80.08XA Age-related osteoporosis with current pathological fracture, vertebra(e), initial encounter for fracture S32.010A Wedge compression fracture of first lumbar vertebra, initial encounter for closed fracture EXAM: FLUOROSCOPIC GUIDED KYPHOPLASTY OF THE L1 VERTEBRAL BODY COMPARISON:  None Available. MEDICATIONS: As antibiotic prophylaxis, 2 g Ancef was ordered pre-procedure and administered intravenously within 1 hour of incision. 1000 mg Tylenol administered postprocedure. ANESTHESIA/SEDATION: Moderate (conscious) sedation was employed during this procedure. A total of Versed 2 mg and Fentanyl 50 mcg was administered intravenously. Moderate Sedation Time: 27 minutes. The patient's level of consciousness and vital signs were monitored continuously by radiology nursing throughout the procedure under my direct supervision. FLUOROSCOPY TIME:  Radiation exposure index: 38.5 mGy  reference air kerma COMPLICATIONS: None immediate. PROCEDURE: The procedure, risks (including but not limited to bleeding, infection, organ damage), benefits, and alternatives were explained to the patient. Questions regarding the procedure were encouraged and answered. The patient understands and consents to the procedure. The patient was placed prone on the fluoroscopic table. The skin overlying the upper thoracic region was then prepped and draped in the usual sterile fashion. Maximal barrier sterile technique was utilized including caps, mask, sterile gowns, sterile gloves, sterile drape, hand hygiene and skin antiseptic. Intravenous Fentanyl and Versed were administered as conscious sedation during continuous cardiorespiratory monitoring by the radiology RN. The left pedicle at L1 was then infiltrated with 1% lidocaine followed by the advancement of a Kyphon trocar needle through the left pedicle into the posterior one-third of the vertebral body. Subsequently, the osteo drill was advanced to the anterior third of the vertebral body. The osteo drill was retracted. Through the working cannula, a Kyphon inflatable bone tamp 15 x 2.5 was advanced and positioned with the distal marker approximately 5 mm from the anterior aspect of the cortex. Appropriate positioning was confirmed on the AP projection. At this time, the balloon was expanded using contrast via a Kyphon inflation syringe device via micro tubing. In similar fashion, the right L1 pedicle was infiltrated with 1% lidocaine followed by the advancement of a second Kyphon trocar needle through the right pedicle into the posterior third of the vertebral body. Subsequently, the osteo drill was coaxially advanced to the anterior right third. The osteo drill was exchanged for a Kyphon inflatable bone tamp 15 x 2.5, advanced to the 5 mm of the anterior aspect of the cortex. The balloon was then expanded using contrast as above. Inflations were continued until  there was near apposition with the superior end plate. At this time, methylmethacrylate mixture was reconstituted in the Kyphon bone mixing device system. This was then loaded into the delivery mechanism, attached to Kyphon bone fillers. The balloons were deflated and removed followed by the instillation of methylmethacrylate mixture with excellent filling in the AP and lateral projections. No extravasation was noted in the disk spaces or posteriorly into the spinal canal. No epidural venous contamination was seen. The working cannulae and the bone filler were then retrieved and removed. Hemostasis was achieved with manual compression. The patient tolerated the procedure well without immediate postprocedural complication. IMPRESSION: 1. Technically successful L1 vertebral body augmentation using balloon kyphoplasty. 2. Per  CMS PQRS reporting requirements (PQRS Measure 24): Given the patient's age of greater than 66 and the fracture site (hip, distal radius, or spine), the patient should be tested for osteoporosis using DXA, and the appropriate treatment considered based on the DXA results. Electronically Signed   By: Jacqulynn Cadet M.D.   On: 12/21/2022 12:25   IR Radiologist Eval & Mgmt  Result Date: 12/19/2022 EXAM: NEW PATIENT OFFICE VISIT CHIEF COMPLAINT: SEE EPIC NOTE HISTORY OF PRESENT ILLNESS: SEE EPIC NOTE REVIEW OF SYSTEMS: SEE EPIC NOTE PHYSICAL EXAMINATION: SEE EPIC NOTE ASSESSMENT AND PLAN: SEE EPIC NOTE Electronically Signed   By: Jacqulynn Cadet M.D.   On: 12/19/2022 15:30   MR Lumbar Spine Wo Contrast  Result Date: 12/12/2022 CLINICAL DATA:  Severe low back pain for 2 weeks.  No known injury. EXAM: MRI LUMBAR SPINE WITHOUT CONTRAST TECHNIQUE: Multiplanar, multisequence MR imaging of the lumbar spine was performed. No intravenous contrast was administered. COMPARISON:  None Available. FINDINGS: Segmentation:  Standard. Alignment: Minimal retrolisthesis of L2 on L3 and L3 on L4. Minimal  grade 1 anterolisthesis of L5 on S1. Vertebrae: No aggressive osseous lesion. No discitis or osteomyelitis. Acute-subacute L1 vertebral body compression fracture with marrow edema within the vertebral body, 4 mm retropulsion of the superior posterior margin of the L1 vertebral body, and approximately 50% height loss. Conus medullaris and cauda equina: Conus extends to the L1 level. Conus and cauda equina appear normal. Paraspinal and other soft tissues: No acute paraspinal abnormality. Disc levels: Disc spaces: Degenerative disease with disc height loss at T11-12, T12-L1, L1-2, L2-3 and L3-4. T11-12: Broad-based disc bulge. Mild bilateral facet arthropathy. No foraminal or central canal stenosis. T12-L1: Mild broad-based disc bulge. Mild bilateral facet arthropathy. No foraminal or central canal stenosis. L1-L2: Broad-based disc bulge. Mild bilateral facet arthropathy. Mild spinal stenosis. Mild bilateral foraminal stenosis. L2-L3: Broad-based disc bulge. Mild bilateral facet arthropathy. Mild spinal stenosis. No foraminal stenosis. L3-L4: Broad-based disc bulge. Mild bilateral facet arthropathy. Mild-moderate spinal stenosis. Moderate bilateral foraminal stenosis. L4-L5: Broad-based disc bulge. Moderate bilateral facet arthropathy. Bilateral lateral recess stenosis. Mild spinal stenosis. Mild bilateral foraminal stenosis. L5-S1: Mild broad-based disc bulge. Moderate right and mild left facet arthropathy. No foraminal or central canal stenosis. IMPRESSION: 1. Acute-subacute L1 vertebral body compression fracture with marrow edema within the vertebral body, 4 mm retropulsion of the superior posterior margin of the L1 vertebral body, and approximately 50% height loss. 2. Diffuse lumbar spine spondylosis as described above. Electronically Signed   By: Kathreen Devoid M.D.   On: 12/12/2022 14:24   DG Lumbar Spine 2-3 Views  Result Date: 12/07/2022 CLINICAL DATA:  Low back pain for 1 week, no known injury EXAM: LUMBAR  SPINE - 2-3 VIEW COMPARISON:  CT abdomen and pelvis 01/15/2019 FINDINGS: Osseous demineralization. Five non-rib-bearing lumbar vertebra. Multilevel facet degenerative changes. Multilevel disc space narrowing and endplate spur formation. Probable mild superior endplate compression fracture of L1, new since 2020. No additional fracture, subluxation, or bone destruction. SI joints preserved. IMPRESSION: Mild superior endplate compression deformity of L1 vertebral body new since 2020. Osseous demineralization with multilevel degenerative disc and facet disease changes lumbar spine. Electronically Signed   By: Lavonia Dana M.D.   On: 12/07/2022 14:42    Labs:  CBC: No results for input(s): "WBC", "HGB", "HCT", "PLT" in the last 8760 hours.  COAGS: No results for input(s): "INR", "APTT" in the last 8760 hours.  BMP: Recent Labs    07/11/22 1127  NA 134  K 4.8  CL 96  CO2 22  GLUCOSE 95  BUN 15  CALCIUM 9.7  CREATININE 0.72    LIVER FUNCTION TESTS: Recent Labs    02/16/22 0726 11/10/22 0916  BILITOT 0.6 0.6  AST 18 19  ALT 14 16  ALKPHOS 77 67  PROT 6.4 6.5  ALBUMIN 4.5 4.5    TUMOR MARKERS: No results for input(s): "AFPTM", "CEA", "CA199", "CHROMGRNA" in the last 8760 hours.  Assessment and Plan:  Pleasant 87 year old female.  Overall, she is doing well.  She has no pain at the L1 fracture site and has healed completely.  Unfortunately, she continues to struggle with low back pain.  It appears that her low back pain was in fact not referred pain from the L1 fracture.  She begins physical therapy next week and will continue to see Dr. Georgina Snell for possible musculoskeletal pain.    No further scheduled follow-up with [place] although we would be happy to treat with epidural steroid injection if Dr. Georgina Snell felt this would offer her benefit.     Electronically Signed: Criselda Peaches 01/04/2023, 4:11 PM   I spent a total of  15 Minutes in remote  clinical consultation, greater  than 50% of which was counseling/coordinating care for L1 compression fracture.    Visit type: Audio only (telephone). Audio (no video) only due to patient preference. Alternative for in-person consultation at [facility], [address] [place], [place], [place]. This visit type was conducted due to national recommendations for restrictions regarding the COVID-19 Pandemic (e.g. social distancing).  This format is felt to be most appropriate for this patient at this time.  All issues noted in this document were discussed and addressed.

## 2023-01-04 NOTE — Telephone Encounter (Signed)
Spoke to pt who  reported she "got rid of her tizanidine" and wasn't interested in taking that. Pt also declined the options of tramadol or hydrocodone. She decided that she would just try the Tylenol, IBU, and naproxen and was hopeful that some PT would help w/ her pain. Pt verbalized understanding.

## 2023-01-05 ENCOUNTER — Ambulatory Visit: Payer: Medicare Other | Admitting: Physical Therapy

## 2023-01-05 ENCOUNTER — Ambulatory Visit (INDEPENDENT_AMBULATORY_CARE_PROVIDER_SITE_OTHER): Payer: Medicare Other | Admitting: Physical Therapy

## 2023-01-05 DIAGNOSIS — M5459 Other low back pain: Secondary | ICD-10-CM

## 2023-01-05 NOTE — Therapy (Signed)
OUTPATIENT PHYSICAL THERAPY THORACOLUMBAR EVALUATION   Patient Name: Julia [name] MRN: [id number] DOB:[birth year], 87 y.o., female Today's Date: 01/05/2023  END OF SESSION:   Past Medical History:  Diagnosis Date   Aortic insufficiency    a. mild by echo 2016. // Echo 06/2022: EF 50-55, no RWMA, septal movement consistent with LBBB, GR 1 DD, normal RVSF, small pericardial effusion, mild MR, mild AI, AV sclerosis without stenosis, RA pressure 3   Chest pain    Nuclear March, 2011, normal, ejection fraction 64%   Coronary artery disease involving native coronary artery of native heart without angina pectoris 07/11/2022   CCTA 9/23: CAC score 158 (49th percentile); non-obstructive CAD (mLAD 25-49, pOM1 25-49, pRCA 1-24, PLV 1-24); small pericardial effusion   Dyslipidemia    Elevated blood pressure reading    GERD (gastroesophageal reflux disease)    Patient sometimes has slight right lower jaw discomfort at the time of her reflux   LBBB (left bundle branch block)    Pericardial effusion    Echo December, 2009, small, incidental   Shoulder pain    Left scapular pain, appeared to be radicular   Past Surgical History:  Procedure Laterality Date   25 GAUGE PARS PLANA VITRECTOMY WITH 20 GAUGE MVR PORT FOR MACULAR HOLE  2000   BREAST BIOPSY  1983   benign   CHOLECYSTECTOMY  1984   IR KYPHO LUMBAR INC FX REDUCE BONE BX UNI/BIL CANNULATION INC/IMAGING  12/21/2022   IR RADIOLOGIST EVAL & MGMT  12/19/2022   IR RADIOLOGIST EVAL & MGMT  01/04/2023   MELANOMA EXCISION  2012, 2013   [place]  2002   Patient Active Problem List   Diagnosis Date Noted   Osteoporosis 01/02/2023   Coronary artery disease involving native coronary artery of native heart without angina pectoris 07/11/2022   Aortic atherosclerosis ([place]) 07/11/2022   Dyslipidemia    Blood pressure alteration    GERD (gastroesophageal reflux disease)    Aortic insufficiency    Shoulder pain     Precordial chest pain    Left bundle branch block 01/03/2010    PCP: ***  REFERRING PROVIDER: ***  REFERRING DIAG: ***  Rationale for Evaluation and Treatment: Rehabilitation  THERAPY DIAG:  No diagnosis found.  ONSET DATE: ***  SUBJECTIVE:                                                                                                                                                                                           SUBJECTIVE STATEMENT: 12/21/22.  L1.  Fx, no fall, etc. No other breaks.  Pian in bil low back L>R.   No pain meds helping/tylenol etc.   Better: heat, constant pain,  Did go to [facility], classes, no aquatic,  has walked around house,   PERTINENT HISTORY:  osteoporosis,   PAIN:  Are you having pain? Yes: NPRS scale: 9/10 Pain location: *** Pain description: *** Aggravating factors: *** Relieving factors: ***  PRECAUTIONS: {Therapy precautions:24002}  WEIGHT BEARING RESTRICTIONS: No  FALLS:  Has patient fallen in last 6 months? No  LIVING ENVIRONMENT: Lives with: {[facility] lives with:25569::"lives with their family"} Lives in: {Lives in:25570} Stairs: {opstairs:27293} Has following equipment at home: {Assistive devices:23999}  OCCUPATION: ***  PLOF: {PLOF:24004}  PATIENT GOALS: ***  NEXT MD VISIT: ***  OBJECTIVE:   DIAGNOSTIC FINDINGS:  ***  PATIENT SURVEYS:  {rehab surveys:24030}  SCREENING FOR RED FLAGS: Bowel or bladder incontinence: {Yes/No:304960894} Spinal tumors: {Yes/No:304960894} Cauda equina syndrome: {Yes/No:304960894} Compression fracture: {Yes/No:304960894} Abdominal aneurysm: {Yes/No:304960894}  COGNITION: Overall cognitive status: {cognition:24006}     SENSATION: {sensation:27233}  MUSCLE LENGTH: Hamstrings: Right *** deg; Left *** deg Thomas test: Right *** deg; Left *** deg  POSTURE: {posture:25561}  PALPATION: ***  LUMBAR ROM:   AROM eval  Flexion   Extension   Right lateral flexion   Left  lateral flexion   Right rotation   Left rotation    (Blank rows = not tested)  LOWER EXTREMITY ROM:     {AROM/PROM:27142}  Right eval Left eval  Hip flexion    Hip extension    Hip abduction    Hip adduction    Hip internal rotation    Hip external rotation    Knee flexion    Knee extension    Ankle dorsiflexion    Ankle plantarflexion    Ankle inversion    Ankle eversion     (Blank rows = not tested)  LOWER EXTREMITY MMT:    MMT Right eval Left eval  Hip flexion    Hip extension    Hip abduction    Hip adduction    Hip internal rotation    Hip external rotation    Knee flexion    Knee extension    Ankle dorsiflexion    Ankle plantarflexion    Ankle inversion    Ankle eversion     (Blank rows = not tested)  LUMBAR SPECIAL TESTS:  {lumbar special test:25242}  FUNCTIONAL TESTS:  {Functional tests:24029}  GAIT: Distance walked: *** Assistive device utilized: {Assistive devices:23999} Level of assistance: {Levels of assistance:24026} Comments: ***  TODAY'S TREATMENT:                                                                                                                              DATE: ***    PATIENT EDUCATION:  Education details: *** Person educated: {Person educated:25204} Education method: {Education Method:25205} Education comprehension: {Education Comprehension:25206}  HOME EXERCISE PROGRAM: ***  ASSESSMENT:  CLINICAL IMPRESSION: Patient is a *** y.o. *** who was seen today  for physical therapy evaluation and treatment for ***.   OBJECTIVE IMPAIRMENTS: {opptimpairments:25111}.   ACTIVITY LIMITATIONS: {activitylimitations:27494}  PARTICIPATION LIMITATIONS: {participationrestrictions:25113}  PERSONAL FACTORS: {Personal factors:25162} are also affecting patient's functional outcome.   REHAB POTENTIAL: {rehabpotential:25112}  CLINICAL DECISION MAKING: {clinical decision making:25114}  EVALUATION COMPLEXITY: {Evaluation  complexity:25115}   GOALS: Goals reviewed with patient? {yes/no:20286}  SHORT TERM GOALS: Target date: ***  *** Baseline: Goal status: {GOALSTATUS:25110}  2.  *** Baseline:  Goal status: {GOALSTATUS:25110}  3.  *** Baseline:  Goal status: {GOALSTATUS:25110}  4.  *** Baseline:  Goal status: {GOALSTATUS:25110}  5.  *** Baseline:  Goal status: {GOALSTATUS:25110}  6.  *** Baseline:  Goal status: {GOALSTATUS:25110}  LONG TERM GOALS: Target date: ***  *** Baseline:  Goal status: {GOALSTATUS:25110}  2.  *** Baseline:  Goal status: {GOALSTATUS:25110}  3.  *** Baseline:  Goal status: {GOALSTATUS:25110}  4.  *** Baseline:  Goal status: {GOALSTATUS:25110}  5.  *** Baseline:  Goal status: {GOALSTATUS:25110}  6.  *** Baseline:  Goal status: {GOALSTATUS:25110}  PLAN:  PT FREQUENCY: {rehab frequency:25116}  PT DURATION: {rehab duration:25117}  PLANNED INTERVENTIONS: {rehab planned interventions:25118::"Therapeutic exercises","Therapeutic activity","Neuromuscular re-education","Balance training","Gait training","Patient/Family education","Self Care","Joint mobilization"}.  PLAN FOR NEXT SESSION: ***   Lyndee Hensen, PT 01/05/2023, 11:02 AM

## 2023-01-07 ENCOUNTER — Encounter: Payer: Self-pay | Admitting: Physical Therapy

## 2023-01-08 ENCOUNTER — Encounter: Payer: Self-pay | Admitting: Physical Therapy

## 2023-01-08 ENCOUNTER — Ambulatory Visit (INDEPENDENT_AMBULATORY_CARE_PROVIDER_SITE_OTHER): Payer: Medicare Other | Admitting: Physical Therapy

## 2023-01-08 DIAGNOSIS — M5459 Other low back pain: Secondary | ICD-10-CM

## 2023-01-08 NOTE — Therapy (Signed)
OUTPATIENT PHYSICAL THERAPY THORACOLUMBAR TREATMENT   Patient Name: Julia [name] MRN: [id number] DOB:1936-03-02, 87 y.o., female Today's Date: 01/08/2023  END OF SESSION:  PT End of Session - 01/08/23 1513     Visit Number 2    Number of Visits 16    Date for PT Re-Evaluation 03/02/23    Authorization Type Medicare    PT Start Time 1517    PT Stop Time 1600    PT Time Calculation (min) 43 min    Activity Tolerance Patient tolerated treatment well    Behavior During Therapy [facility] for tasks assessed/performed             Past Medical History:  Diagnosis Date   Aortic insufficiency    a. mild by echo 2016. // Echo 06/2022: EF 50-55, no RWMA, septal movement consistent with LBBB, GR 1 DD, normal RVSF, small pericardial effusion, mild MR, mild AI, AV sclerosis without stenosis, RA pressure 3   Chest pain    Nuclear March, 2011, normal, ejection fraction 64%   Coronary artery disease involving native coronary artery of native heart without angina pectoris 07/11/2022   CCTA 9/23: CAC score 158 (49th percentile); non-obstructive CAD (mLAD 25-49, pOM1 25-49, pRCA 1-24, PLV 1-24); small pericardial effusion   Dyslipidemia    Elevated blood pressure reading    GERD (gastroesophageal reflux disease)    Patient sometimes has slight right lower jaw discomfort at the time of her reflux   LBBB (left bundle branch block)    Pericardial effusion    Echo December, 2009, small, incidental   Shoulder pain    Left scapular pain, appeared to be radicular   Past Surgical History:  Procedure Laterality Date   25 GAUGE PARS PLANA VITRECTOMY WITH 20 GAUGE MVR PORT FOR MACULAR HOLE  2000   BREAST BIOPSY  1983   benign   CHOLECYSTECTOMY  1984   IR KYPHO LUMBAR INC FX REDUCE BONE BX UNI/BIL CANNULATION INC/IMAGING  12/21/2022   IR RADIOLOGIST EVAL & MGMT  12/19/2022   IR RADIOLOGIST EVAL & MGMT  01/04/2023   MELANOMA EXCISION  2012, 2013   [place]  2002    Patient Active Problem List   Diagnosis Date Noted   Osteoporosis 01/02/2023   Coronary artery disease involving native coronary artery of native heart without angina pectoris 07/11/2022   Aortic atherosclerosis ([place]) 07/11/2022   Dyslipidemia    Blood pressure alteration    GERD (gastroesophageal reflux disease)    Aortic insufficiency    Shoulder pain    Precordial chest pain    Left bundle branch block 01/03/2010    PCP: Rochele Raring  REFERRING PROVIDER: Lynne Leader   REFERRING DIAG: Lynne Leader  Rationale for Evaluation and Treatment: Rehabilitation  THERAPY DIAG:  Other low back pain  ONSET DATE:   SUBJECTIVE:  SUBJECTIVE STATEMENT: 01/08/2023 Pt states continued pain in back. States pain with just sitting In chair at times.    Eval: 12/21/22 pt had kyphoplasty for L1 fracture. States no fall, etc, does have Osteoporosis. Reports No other breaks.  States Pain in bil low back L>R.  No pain meds helping/tylenol etc.   Better: heat, constant pain,  Did go to [facility], classes, no aquatic, is walking around house some for exercise.    PERTINENT HISTORY:  osteoporosis,   PAIN:  Are you having pain? Yes: NPRS scale: 9/10 Pain location: bil low lumbar Pain description: achey Aggravating factors: sitting, standing, unable to state Relieving factors: none stated   PRECAUTIONS: Other: Osteoporosis  WEIGHT BEARING RESTRICTIONS: No  FALLS:  Has patient fallen in last 6 months? No  PLOF: Independent  PATIENT GOALS: Decreased pain in back  NEXT MD VISIT:   OBJECTIVE:   DIAGNOSTIC FINDINGS:    PATIENT SURVEYS:    COGNITION: Overall cognitive status: Within functional limits for tasks assessed     SENSATION:   POSTURE: sway back posture   PALPATION: Minimal pain to palpate  today. Tightness in bil mid and low thoracic paraspinals , lumbar and QL.   LUMBAR ROM:   AROM eval  Flexion Mod limitation  Extension Mod/significant limitation  Right lateral flexion Mod limitation  Left lateral flexion Mod limitation  Right rotation   Left rotation    (Blank rows = not tested)  LOWER EXTREMITY ROM:    Hips: mild limitation for ER and IR bil,  Knees: WFL  LOWER EXTREMITY MMT:    MMT Right eval Left eval  Hip flexion 4 4  Hip extension    Hip abduction 4 4  Hip adduction    Hip internal rotation    Hip external rotation    Knee flexion 5 5  Knee extension 5 5  Ankle dorsiflexion    Ankle plantarflexion    Ankle inversion    Ankle eversion     (Blank rows = not tested)  LUMBAR SPECIAL TESTS:   GAIT:   TODAY'S TREATMENT:                                                                                                                              DATE:   01/08/23: Therapeutic Exercise: Aerobic: Supine:  SKTC 30 sec x 3;  Hip IR ROM x 10 bil;  pelvic tilts x 15; Supine posture with knees bent, with education on finding comfortable position for sleeping. Supine to sit transfer with education on log roll for back pain;  Seated: seated posture, with education on finding comfortable position in chair at home and car.  Standing: Stretches:  Seated fwd flexion 15 sec x 3 (pain in back ) ;  Neuromuscular Re-education: Manual Therapy: STM to L lumbar paraspinals Therapeutic Activity: Self Care:   PATIENT EDUCATION:  Education details: Updated and reviewed HEP Person educated: Patient Education method: Explanation, Demonstration, Tactile cues,  Verbal cues, and Handouts Education comprehension: verbalized understanding, returned demonstration, verbal cues required, tactile cues required, and needs further education  HOME EXERCISE PROGRAM: Access Code: [email or web address] URL: [email or web address] Date: 01/07/2023 Prepared by: Lyndee Hensen  Exercises - Supine Posterior Pelvic Tilt  - 2 x daily - 1 sets - 10 reps - Single Knee to Chest Stretch  - 2 x daily - 3 reps - 30 hold - Supine Bilateral Hip Internal Rotation Stretch  - 2 x daily - 1 sets - 10 reps - Seated Correct Posture   ASSESSMENT:  CLINICAL IMPRESSION: 01/08/2023 Pt with tenderness in L paraspinals, and in high lumbar region with palpation today. Pt with pain in seated position today, discussed ways to find comfortable positioning. Pt to benefit from continued manual as needed for pain, lumbar decompression and education on lumbar mobility  Eval: Patient presents with primary complaint of increased pain in low back, following recent surgery for compression fracture. She has increased muscle tension and tenderness in bil low thoracic and lumbar region. She has increased stiffness in t and l spine, with decreased ROM, and poor movement mechanics. Pt with decreased ability for full functional activities, and will benefit from skilled PT to improve deficits and pain.   OBJECTIVE IMPAIRMENTS: decreased activity tolerance, decreased mobility, difficulty walking, decreased ROM, decreased strength, hypomobility, impaired perceived functional ability, increased muscle spasms, impaired flexibility, improper body mechanics, and pain.   ACTIVITY LIMITATIONS: carrying, lifting, bending, sitting, standing, squatting, transfers, and locomotion level  PARTICIPATION LIMITATIONS: meal prep, cleaning, laundry, driving, shopping, and community activity  PERSONAL FACTORS:  none  are also affecting patient's functional outcome.   REHAB POTENTIAL: Good  CLINICAL DECISION MAKING: Stable/uncomplicated  EVALUATION COMPLEXITY: Low   GOALS: Goals reviewed with patient? Yes  SHORT TERM GOALS: Target date: 01/19/2023  Pt to be independent with initial HEP  Goal status: INITIAL  2.  Pt to report comfortable seated positioning at home in chair or couch.   Goal status:  INITIAL   LONG TERM GOALS: Target date: 03/02/2023    Pt to be independent with final HEP  Goal status: INITIAL  2.  Pt to report decreased pain in back to 0-3/10 with activity and IADLs.   Goal status: INITIAL  3.  Pt to demo improved postural awareness for standing and sitting, for decreased pressure and pain.   Goal status: INITIAL  4.  Pt to demo optimal bend, lift, squat mechanics to improve pain and ability for IADLs.    Goal status: INITIAL   PLAN:  PT FREQUENCY: 1-2x/week  PT DURATION: 8 weeks  PLANNED INTERVENTIONS: Therapeutic exercises, Therapeutic activity, Neuromuscular re-education, Balance training, Gait training, Patient/Family education, Self Care, Joint mobilization, Joint manipulation, Orthotic/Fit training, DME instructions, Aquatic Therapy, Dry Needling, Electrical stimulation, Spinal manipulation, Spinal mobilization, Cryotherapy, Moist heat, Taping, Traction, Ultrasound, Ionotophoresis '4mg'$ /ml Dexamethasone, and Manual therapy.  PLAN FOR NEXT SESSION: light flexion/lumbar/hip mobility, decompressive positioning, review HEP   Lyndee Hensen, PT, DPT 9:20 PM  01/08/23

## 2023-01-09 ENCOUNTER — Encounter: Payer: Self-pay | Admitting: Interventional Cardiology

## 2023-01-11 ENCOUNTER — Encounter: Payer: Self-pay | Admitting: Physical Therapy

## 2023-01-11 ENCOUNTER — Ambulatory Visit: Payer: Medicare Other | Admitting: Physical Therapy

## 2023-01-11 ENCOUNTER — Ambulatory Visit (INDEPENDENT_AMBULATORY_CARE_PROVIDER_SITE_OTHER): Payer: Medicare Other | Admitting: Physical Therapy

## 2023-01-11 DIAGNOSIS — M5459 Other low back pain: Secondary | ICD-10-CM | POA: Diagnosis not present

## 2023-01-11 NOTE — Therapy (Signed)
OUTPATIENT PHYSICAL THERAPY THORACOLUMBAR TREATMENT   Patient Name: Julia [name] MRN: [id number] DOB:03/10/1936, 87 y.o., female Today's Date: 01/11/2023  END OF SESSION:  PT End of Session - 01/11/23 1511     Visit Number 3    Number of Visits 16    Date for PT Re-Evaluation 03/02/23    Authorization Type Medicare    PT Start Time 1515    PT Stop Time 1555    PT Time Calculation (min) 40 min    Activity Tolerance Patient tolerated treatment well    Behavior During Therapy [facility] for tasks assessed/performed             Past Medical History:  Diagnosis Date   Aortic insufficiency    a. mild by echo 2016. // Echo 06/2022: EF 50-55, no RWMA, septal movement consistent with LBBB, GR 1 DD, normal RVSF, small pericardial effusion, mild MR, mild AI, AV sclerosis without stenosis, RA pressure 3   Chest pain    Nuclear March, 2011, normal, ejection fraction 64%   Coronary artery disease involving native coronary artery of native heart without angina pectoris 07/11/2022   CCTA 9/23: CAC score 158 (49th percentile); non-obstructive CAD (mLAD 25-49, pOM1 25-49, pRCA 1-24, PLV 1-24); small pericardial effusion   Dyslipidemia    Elevated blood pressure reading    GERD (gastroesophageal reflux disease)    Patient sometimes has slight right lower jaw discomfort at the time of her reflux   LBBB (left bundle branch block)    Pericardial effusion    Echo December, 2009, small, incidental   Shoulder pain    Left scapular pain, appeared to be radicular   Past Surgical History:  Procedure Laterality Date   25 GAUGE PARS PLANA VITRECTOMY WITH 20 GAUGE MVR PORT FOR MACULAR HOLE  2000   BREAST BIOPSY  1983   benign   CHOLECYSTECTOMY  1984   IR KYPHO LUMBAR INC FX REDUCE BONE BX UNI/BIL CANNULATION INC/IMAGING  12/21/2022   IR RADIOLOGIST EVAL & MGMT  12/19/2022   IR RADIOLOGIST EVAL & MGMT  01/04/2023   MELANOMA EXCISION  2012, 2013   [place]  2002    Patient Active Problem List   Diagnosis Date Noted   Osteoporosis 01/02/2023   Coronary artery disease involving native coronary artery of native heart without angina pectoris 07/11/2022   Aortic atherosclerosis ([place]) 07/11/2022   Dyslipidemia    Blood pressure alteration    GERD (gastroesophageal reflux disease)    Aortic insufficiency    Shoulder pain    Precordial chest pain    Left bundle branch block 01/03/2010    PCP: Rochele Raring  REFERRING PROVIDER: Lynne Leader   REFERRING DIAG: Lynne Leader  Rationale for Evaluation and Treatment: Rehabilitation  THERAPY DIAG:  Other low back pain  ONSET DATE:   SUBJECTIVE:  SUBJECTIVE STATEMENT: 01/11/2023 Pt states continued pain in back.    Eval: 12/21/22 pt had kyphoplasty for L1 fracture. States no fall, etc, does have Osteoporosis. Reports No other breaks.  States Pain in bil low back L>R.  No pain meds helping/tylenol etc.   Better: heat, constant pain,  Did go to [facility], classes, no aquatic, is walking around house some for exercise.    PERTINENT HISTORY:  osteoporosis,   PAIN:  Are you having pain? Yes: NPRS scale: 9/10 Pain location: bil low lumbar Pain description: achey Aggravating factors: sitting, standing, unable to state Relieving factors: none stated   PRECAUTIONS: Other: Osteoporosis  WEIGHT BEARING RESTRICTIONS: No  FALLS:  Has patient fallen in last 6 months? No  PLOF: Independent  PATIENT GOALS: Decreased pain in back  NEXT MD VISIT:   OBJECTIVE:   DIAGNOSTIC FINDINGS:    PATIENT SURVEYS:    COGNITION: Overall cognitive status: Within functional limits for tasks assessed     SENSATION:   POSTURE: sway back posture   PALPATION: Minimal pain to palpate today. Tightness in bil mid and low thoracic  paraspinals , lumbar and QL.   LUMBAR ROM:   AROM eval  Flexion Mod limitation  Extension Mod/significant limitation  Right lateral flexion Mod limitation  Left lateral flexion Mod limitation  Right rotation   Left rotation    (Blank rows = not tested)  LOWER EXTREMITY ROM:    Hips: mild limitation for ER and IR bil,  Knees: WFL  LOWER EXTREMITY MMT:    MMT Right eval Left eval  Hip flexion 4 4  Hip extension    Hip abduction 4 4  Hip adduction    Hip internal rotation    Hip external rotation    Knee flexion 5 5  Knee extension 5 5  Ankle dorsiflexion    Ankle plantarflexion    Ankle inversion    Ankle eversion     (Blank rows = not tested)  LUMBAR SPECIAL TESTS:   GAIT:   TODAY'S TREATMENT:                                                                                                                              DATE:   01/11/23: Therapeutic Exercise: Aerobic: Supine:  SKTC 30 sec x 3;  Hip IR ROM x 10 bil;  pelvic tilts x 15; OP heel press x 10 bil;  Seated: seated posture, with education on finding comfortable position in chair at home and car.  Standing: Stretches:  LTR x 10;   Neuromuscular Re-education: Manual Therapy: STM to L lumbar paraspinals  Therapeutic Activity: Self Care:   01/08/23: Therapeutic Exercise: Aerobic: Supine:  SKTC 30 sec x 3;  Hip IR ROM x 10 bil;  pelvic tilts x 15; Supine posture with knees bent, with education on finding comfortable position for sleeping. Supine to sit transfer with education on log roll for back pain;  Seated: seated posture, with  education on finding comfortable position in chair at home and car.  Standing: Stretches:  Seated fwd flexion 15 sec x 3 (pain in back ) ;  Neuromuscular Re-education: Manual Therapy: STM to L lumbar paraspinals Therapeutic Activity: Self Care:   PATIENT EDUCATION:  Education details: Updated and reviewed HEP Person educated: Patient Education method: Explanation,  Demonstration, Tactile cues, Verbal cues, and Handouts Education comprehension: verbalized understanding, returned demonstration, verbal cues required, tactile cues required, and needs further education  HOME EXERCISE PROGRAM: Access Code: [email or web address] URL: [email or web address] Date: 01/07/2023 Prepared by: Lyndee Hensen  Exercises - Supine Posterior Pelvic Tilt  - 2 x daily - 1 sets - 10 reps - Single Knee to Chest Stretch  - 2 x daily - 3 reps - 30 hold - Supine Bilateral Hip Internal Rotation Stretch  - 2 x daily - 1 sets - 10 reps - Seated Correct Posture   ASSESSMENT:  CLINICAL IMPRESSION: 01/11/2023  Pt with tenderness in L paraspinals, but decreased from previous visit. .Continued education on posture and positioning for  comfort and pain relief. Pt to benefit from continued manual as needed for pain, lumbar decompression and education on lumbar mobility  Eval: Patient presents with primary complaint of increased pain in low back, following recent surgery for compression fracture. She has increased muscle tension and tenderness in bil low thoracic and lumbar region. She has increased stiffness in t and l spine, with decreased ROM, and poor movement mechanics. Pt with decreased ability for full functional activities, and will benefit from skilled PT to improve deficits and pain.   OBJECTIVE IMPAIRMENTS: decreased activity tolerance, decreased mobility, difficulty walking, decreased ROM, decreased strength, hypomobility, impaired perceived functional ability, increased muscle spasms, impaired flexibility, improper body mechanics, and pain.   ACTIVITY LIMITATIONS: carrying, lifting, bending, sitting, standing, squatting, transfers, and locomotion level  PARTICIPATION LIMITATIONS: meal prep, cleaning, laundry, driving, shopping, and community activity  PERSONAL FACTORS:  none  are also affecting patient's functional outcome.   REHAB POTENTIAL: Good  CLINICAL DECISION  MAKING: Stable/uncomplicated  EVALUATION COMPLEXITY: Low   GOALS: Goals reviewed with patient? Yes  SHORT TERM GOALS: Target date: 01/19/2023  Pt to be independent with initial HEP  Goal status: INITIAL  2.  Pt to report comfortable seated positioning at home in chair or couch.   Goal status: INITIAL   LONG TERM GOALS: Target date: 03/02/2023    Pt to be independent with final HEP  Goal status: INITIAL  2.  Pt to report decreased pain in back to 0-3/10 with activity and IADLs.   Goal status: INITIAL  3.  Pt to demo improved postural awareness for standing and sitting, for decreased pressure and pain.   Goal status: INITIAL  4.  Pt to demo optimal bend, lift, squat mechanics to improve pain and ability for IADLs.    Goal status: INITIAL   PLAN:  PT FREQUENCY: 1-2x/week  PT DURATION: 8 weeks  PLANNED INTERVENTIONS: Therapeutic exercises, Therapeutic activity, Neuromuscular re-education, Balance training, Gait training, Patient/Family education, Self Care, Joint mobilization, Joint manipulation, Orthotic/Fit training, DME instructions, Aquatic Therapy, Dry Needling, Electrical stimulation, Spinal manipulation, Spinal mobilization, Cryotherapy, Moist heat, Taping, Traction, Ultrasound, Ionotophoresis 4mg /ml Dexamethasone, and Manual therapy.  PLAN FOR NEXT SESSION: light flexion/lumbar/hip mobility, decompressive positioning, review HEP   Lyndee Hensen, PT, DPT 3:12 PM  01/11/23

## 2023-01-13 ENCOUNTER — Encounter: Payer: Self-pay | Admitting: Physical Therapy

## 2023-01-15 ENCOUNTER — Encounter: Payer: Self-pay | Admitting: Physical Therapy

## 2023-01-15 ENCOUNTER — Ambulatory Visit (INDEPENDENT_AMBULATORY_CARE_PROVIDER_SITE_OTHER): Payer: Medicare Other | Admitting: Physical Therapy

## 2023-01-15 DIAGNOSIS — M5459 Other low back pain: Secondary | ICD-10-CM | POA: Diagnosis not present

## 2023-01-15 NOTE — Therapy (Signed)
OUTPATIENT PHYSICAL THERAPY THORACOLUMBAR TREATMENT   Patient Name: Julia [name] MRN: [id number] DOB:21-Dec-1935, 86 y.o., female Today's Date: 01/15/2023  END OF SESSION:  PT End of Session - 01/15/23 2130     Visit Number 4    Number of Visits 16    Date for PT Re-Evaluation 03/02/23    Authorization Type Medicare    PT Start Time 1520    PT Stop Time 1600    PT Time Calculation (min) 40 min    Activity Tolerance Patient tolerated treatment well    Behavior During Therapy [facility] for tasks assessed/performed              Past Medical History:  Diagnosis Date   Aortic insufficiency    a. mild by echo 2016. // Echo 06/2022: EF 50-55, no RWMA, septal movement consistent with LBBB, GR 1 DD, normal RVSF, small pericardial effusion, mild MR, mild AI, AV sclerosis without stenosis, RA pressure 3   Chest pain    Nuclear March, 2011, normal, ejection fraction 64%   Coronary artery disease involving native coronary artery of native heart without angina pectoris 07/11/2022   CCTA 9/23: CAC score 158 (49th percentile); non-obstructive CAD (mLAD 25-49, pOM1 25-49, pRCA 1-24, PLV 1-24); small pericardial effusion   Dyslipidemia    Elevated blood pressure reading    GERD (gastroesophageal reflux disease)    Patient sometimes has slight right lower jaw discomfort at the time of her reflux   LBBB (left bundle branch block)    Pericardial effusion    Echo December, 2009, small, incidental   Shoulder pain    Left scapular pain, appeared to be radicular   Past Surgical History:  Procedure Laterality Date   25 GAUGE PARS PLANA VITRECTOMY WITH 20 GAUGE MVR PORT FOR MACULAR HOLE  2000   BREAST BIOPSY  1983   benign   CHOLECYSTECTOMY  1984   IR KYPHO LUMBAR INC FX REDUCE BONE BX UNI/BIL CANNULATION INC/IMAGING  12/21/2022   IR RADIOLOGIST EVAL & MGMT  12/19/2022   IR RADIOLOGIST EVAL & MGMT  01/04/2023   MELANOMA EXCISION  2012, 2013   [place]  2002    Patient Active Problem List   Diagnosis Date Noted   Osteoporosis 01/02/2023   Coronary artery disease involving native coronary artery of native heart without angina pectoris 07/11/2022   Aortic atherosclerosis ([place]) 07/11/2022   Dyslipidemia    Blood pressure alteration    GERD (gastroesophageal reflux disease)    Aortic insufficiency    Shoulder pain    Precordial chest pain    Left bundle branch block 01/03/2010    PCP: Rochele Raring  REFERRING PROVIDER: Lynne Leader   REFERRING DIAG: Lynne Leader  Rationale for Evaluation and Treatment: Rehabilitation  THERAPY DIAG:  Other low back pain  ONSET DATE:   SUBJECTIVE:  SUBJECTIVE STATEMENT: 01/15/2023 Pt states she has had some time that has been pain free. Still feeling pain, mostly more in the morning .    Eval: 12/21/22 pt had kyphoplasty for L1 fracture. States no fall, etc, does have Osteoporosis. Reports No other breaks.  States Pain in bil low back L>R.  No pain meds helping/tylenol etc.   Better: heat, constant pain,  Did go to [facility], classes, no aquatic, is walking around house some for exercise.    PERTINENT HISTORY:  osteoporosis,   PAIN:  Are you having pain? Yes: NPRS scale: 9/10 Pain location: bil low lumbar Pain description: achey Aggravating factors: sitting, standing, unable to state Relieving factors: none stated   PRECAUTIONS: Other: Osteoporosis  WEIGHT BEARING RESTRICTIONS: No  FALLS:  Has patient fallen in last 6 months? No  PLOF: Independent  PATIENT GOALS: Decreased pain in back  NEXT MD VISIT:   OBJECTIVE:   DIAGNOSTIC FINDINGS:    PATIENT SURVEYS:    COGNITION: Overall cognitive status: Within functional limits for tasks assessed     SENSATION:   POSTURE: sway back posture    PALPATION: Minimal pain to palpate today. Tightness in bil mid and low thoracic paraspinals , lumbar and QL.   LUMBAR ROM:   AROM eval  Flexion Mod limitation  Extension Mod/significant limitation  Right lateral flexion Mod limitation  Left lateral flexion Mod limitation  Right rotation   Left rotation    (Blank rows = not tested)  LOWER EXTREMITY ROM:    Hips: mild limitation for ER and IR bil,  Knees: WFL  LOWER EXTREMITY MMT:    MMT Right eval Left eval  Hip flexion 4 4  Hip extension    Hip abduction 4 4  Hip adduction    Hip internal rotation    Hip external rotation    Knee flexion 5 5  Knee extension 5 5  Ankle dorsiflexion    Ankle plantarflexion    Ankle inversion    Ankle eversion     (Blank rows = not tested)  LUMBAR SPECIAL TESTS:   GAIT:   TODAY'S TREATMENT:                                                                                                                              DATE:   01/15/23: Therapeutic Exercise: Aerobic: Supine:  SKTC 30 sec x 3;  pelvic tilts x 15;  OP heel press x 10 bil; supine march with TA x 10;  TA x 10 with education on achieving contraction;   Seated: Pelvic tilts x 15;  sit to stand 2 x 5 with education on back posture and TA;  Standing: Stretches:  LTR x 10;  Seated HSS 30 sec x 4 bil;  Neuromuscular Re-education: Manual Therapy: STM to L lumbar paraspinals and QL in s/l.  Therapeutic Activity: Self Care:  01/11/23: Therapeutic Exercise: Aerobic: Supine:  SKTC 30 sec x 3;  Hip IR  ROM x 10 bil;  pelvic tilts x 15; OP heel press x 10 bil;  Seated: seated posture, with education on finding comfortable position in chair at home and car.  Standing: Stretches:  LTR x 10;   Neuromuscular Re-education: Manual Therapy: STM to L lumbar paraspinals  Therapeutic Activity: Self Care:   01/08/23: Therapeutic Exercise: Aerobic: Supine:  SKTC 30 sec x 3;  Hip IR ROM x 10 bil;  pelvic tilts x 15; Supine posture  with knees bent, with education on finding comfortable position for sleeping. Supine to sit transfer with education on log roll for back pain;  Seated: seated posture, with education on finding comfortable position in chair at home and car.  Standing: Stretches:  Seated fwd flexion 15 sec x 3 (pain in back ) ;  Neuromuscular Re-education: Manual Therapy: STM to L lumbar paraspinals Therapeutic Activity: Self Care:   PATIENT EDUCATION:  Education details: Updated and reviewed HEP Person educated: Patient Education method: Explanation, Demonstration, Tactile cues, Verbal cues, and Handouts Education comprehension: verbalized understanding, returned demonstration, verbal cues required, tactile cues required, and needs further education  HOME EXERCISE PROGRAM: Access Code: [email or web address]   ASSESSMENT:  CLINICAL IMPRESSION: 01/15/2023  Pt states pain in L QL and into side, but has minimal soreness in this region with deep palpation. Continues to have muscle tension in L paraspinals with manual today. Discussed trying to do some mobility and HEP in AM, and when she is in pain to see if she can change pain levels. Pt to benefit from continued care.   Eval: Patient presents with primary complaint of increased pain in low back, following recent surgery for compression fracture. She has increased muscle tension and tenderness in bil low thoracic and lumbar region. She has increased stiffness in t and l spine, with decreased ROM, and poor movement mechanics. Pt with decreased ability for full functional activities, and will benefit from skilled PT to improve deficits and pain.   OBJECTIVE IMPAIRMENTS: decreased activity tolerance, decreased mobility, difficulty walking, decreased ROM, decreased strength, hypomobility, impaired perceived functional ability, increased muscle spasms, impaired flexibility, improper body mechanics, and pain.   ACTIVITY LIMITATIONS: carrying, lifting, bending, sitting,  standing, squatting, transfers, and locomotion level  PARTICIPATION LIMITATIONS: meal prep, cleaning, laundry, driving, shopping, and community activity  PERSONAL FACTORS:  none  are also affecting patient's functional outcome.   REHAB POTENTIAL: Good  CLINICAL DECISION MAKING: Stable/uncomplicated  EVALUATION COMPLEXITY: Low   GOALS: Goals reviewed with patient? Yes  SHORT TERM GOALS: Target date: 01/19/2023  Pt to be independent with initial HEP  Goal status: INITIAL  2.  Pt to report comfortable seated positioning at home in chair or couch.   Goal status: INITIAL   LONG TERM GOALS: Target date: 03/02/2023    Pt to be independent with final HEP  Goal status: INITIAL  2.  Pt to report decreased pain in back to 0-3/10 with activity and IADLs.   Goal status: INITIAL  3.  Pt to demo improved postural awareness for standing and sitting, for decreased pressure and pain.   Goal status: INITIAL  4.  Pt to demo optimal bend, lift, squat mechanics to improve pain and ability for IADLs.    Goal status: INITIAL   PLAN:  PT FREQUENCY: 1-2x/week  PT DURATION: 8 weeks  PLANNED INTERVENTIONS: Therapeutic exercises, Therapeutic activity, Neuromuscular re-education, Balance training, Gait training, Patient/Family education, Self Care, Joint mobilization, Joint manipulation, Orthotic/Fit training, DME instructions, Aquatic Therapy, Dry Needling, Electrical stimulation, Spinal manipulation,  Spinal mobilization, Cryotherapy, Moist heat, Taping, Traction, Ultrasound, Ionotophoresis 4mg /ml Dexamethasone, and Manual therapy.  PLAN FOR NEXT SESSION: light flexion/lumbar/hip mobility, decompressive positioning, review HEP   Lyndee Hensen, PT, DPT 9:31 PM  01/15/23

## 2023-01-18 ENCOUNTER — Ambulatory Visit (INDEPENDENT_AMBULATORY_CARE_PROVIDER_SITE_OTHER): Payer: Medicare Other | Admitting: Physical Therapy

## 2023-01-18 ENCOUNTER — Ambulatory Visit: Payer: Medicare Other | Admitting: Family Medicine

## 2023-01-18 DIAGNOSIS — M5459 Other low back pain: Secondary | ICD-10-CM

## 2023-01-18 NOTE — Therapy (Signed)
OUTPATIENT PHYSICAL THERAPY THORACOLUMBAR TREATMENT   Patient Name: Julia [name] MRN: [id number] DOB:09-Jun-1936, 87 y.o., female Today's Date: 01/18/2023  END OF SESSION:  PT End of Session - 01/18/23 1521     Visit Number 5    Number of Visits 16    Date for PT Re-Evaluation 03/02/23    Authorization Type Medicare    PT Start Time 1520    PT Stop Time 1600    PT Time Calculation (min) 40 min    Activity Tolerance Patient tolerated treatment well    Behavior During Therapy [facility] for tasks assessed/performed               Past Medical History:  Diagnosis Date   Aortic insufficiency    a. mild by echo 2016. // Echo 06/2022: EF 50-55, no RWMA, septal movement consistent with LBBB, GR 1 DD, normal RVSF, small pericardial effusion, mild MR, mild AI, AV sclerosis without stenosis, RA pressure 3   Chest pain    Nuclear March, 2011, normal, ejection fraction 64%   Coronary artery disease involving native coronary artery of native heart without angina pectoris 07/11/2022   CCTA 9/23: CAC score 158 (49th percentile); non-obstructive CAD (mLAD 25-49, pOM1 25-49, pRCA 1-24, PLV 1-24); small pericardial effusion   Dyslipidemia    Elevated blood pressure reading    GERD (gastroesophageal reflux disease)    Patient sometimes has slight right lower jaw discomfort at the time of her reflux   LBBB (left bundle branch block)    Pericardial effusion    Echo December, 2009, small, incidental   Shoulder pain    Left scapular pain, appeared to be radicular   Past Surgical History:  Procedure Laterality Date   25 GAUGE PARS PLANA VITRECTOMY WITH 20 GAUGE MVR PORT FOR MACULAR HOLE  2000   BREAST BIOPSY  1983   benign   CHOLECYSTECTOMY  1984   IR KYPHO LUMBAR INC FX REDUCE BONE BX UNI/BIL CANNULATION INC/IMAGING  12/21/2022   IR RADIOLOGIST EVAL & MGMT  12/19/2022   IR RADIOLOGIST EVAL & MGMT  01/04/2023   MELANOMA EXCISION  2012, 2013   [place]   2002   Patient Active Problem List   Diagnosis Date Noted   Osteoporosis 01/02/2023   Coronary artery disease involving native coronary artery of native heart without angina pectoris 07/11/2022   Aortic atherosclerosis ([place]) 07/11/2022   Dyslipidemia    Blood pressure alteration    GERD (gastroesophageal reflux disease)    Aortic insufficiency    Shoulder pain    Precordial chest pain    Left bundle branch block 01/03/2010    PCP: Rochele Raring  REFERRING PROVIDER: Lynne Leader   REFERRING DIAG: Lynne Leader  Rationale for Evaluation and Treatment: Rehabilitation  THERAPY DIAG:  Other low back pain  ONSET DATE:   SUBJECTIVE:  SUBJECTIVE STATEMENT: 01/19/2023 Pt states continued pain, mostly in L low back, and over to her side    Eval: 12/21/22 pt had kyphoplasty for L1 fracture. States no fall, etc, does have Osteoporosis. Reports No other breaks.  States Pain in bil low back L>R.  No pain meds helping/tylenol etc.   Better: heat, constant pain,  Did go to [facility], classes, no aquatic, is walking around house some for exercise.    PERTINENT HISTORY:  osteoporosis,   PAIN:  Are you having pain? Yes: NPRS scale: 9/10 Pain location: bil low lumbar Pain description: achey Aggravating factors: sitting, standing, unable to state Relieving factors: none stated   PRECAUTIONS: Other: Osteoporosis  WEIGHT BEARING RESTRICTIONS: No  FALLS:  Has patient fallen in last 6 months? No  PLOF: Independent  PATIENT GOALS: Decreased pain in back  NEXT MD VISIT:   OBJECTIVE:   DIAGNOSTIC FINDINGS:    PATIENT SURVEYS:    COGNITION: Overall cognitive status: Within functional limits for tasks assessed     SENSATION:   POSTURE: sway back posture   PALPATION: Minimal pain to palpate today.  Tightness in bil mid and low thoracic paraspinals , lumbar and QL.   LUMBAR ROM:   AROM eval  Flexion Mod limitation  Extension Mod/significant limitation  Right lateral flexion Mod limitation  Left lateral flexion Mod limitation  Right rotation   Left rotation    (Blank rows = not tested)  LOWER EXTREMITY ROM:    Hips: mild limitation for ER and IR bil,  Knees: WFL  LOWER EXTREMITY MMT:    MMT Right eval Left eval  Hip flexion 4 4  Hip extension    Hip abduction 4 4  Hip adduction    Hip internal rotation    Hip external rotation    Knee flexion 5 5  Knee extension 5 5  Ankle dorsiflexion    Ankle plantarflexion    Ankle inversion    Ankle eversion     (Blank rows = not tested)  LUMBAR SPECIAL TESTS:   GAIT:   TODAY'S TREATMENT:                                                                                                                              DATE:   01/18/23: Therapeutic Exercise: Aerobic: Supine:  SKTC 30 sec x 3;  pelvic tilts x 15;  decompression reaches 5 sec x 10;  supine march with TA x 10;   trial bridging x 5 (difficult) ;  Seated: Pelvic tilts x 15;  seated hip hinge x 15;  Standing: Stretches:  LTR x 10;  Seated HSS 30 sec x 4 bil;  Neuromuscular Re-education: Manual Therapy:  Therapeutic Activity: Self Care:  01/15/23: Therapeutic Exercise: Aerobic: Supine:  SKTC 30 sec x 3;  pelvic tilts x 15;  OP heel press x 10 bil; supine march with TA x 10;  TA x 10 with education on achieving contraction;  Seated: Pelvic tilts x 15;  sit to stand 2 x 5 with education on back posture and TA;  Standing: Stretches:  LTR x 10;  Seated HSS 30 sec x 4 bil;  Neuromuscular Re-education: Manual Therapy: STM to L lumbar paraspinals and QL in s/l.  Therapeutic Activity: Self Care:  01/11/23: Therapeutic Exercise: Aerobic: Supine:  SKTC 30 sec x 3;  Hip IR ROM x 10 bil;  pelvic tilts x 15; OP heel press x 10 bil;  Seated: seated posture, with  education on finding comfortable position in chair at home and car.  Standing: Stretches:  LTR x 10;   Neuromuscular Re-education: Manual Therapy: STM to L lumbar paraspinals  Therapeutic Activity: Self Care:  PATIENT EDUCATION:  Education details: Updated and reviewed HEP Person educated: Patient Education method: Explanation, Demonstration, Tactile cues, Verbal cues, and Handouts Education comprehension: verbalized understanding, returned demonstration, verbal cues required, tactile cues required, and needs further education  HOME EXERCISE PROGRAM: Access Code: [email or web address]   ASSESSMENT:  CLINICAL IMPRESSION: 01/19/2023  Pt states pain L side, above pelvic crest. Minimal pain to palpate here. Pt with much stiffness in t and L spine. Still very challenged with mobility for back, pelvic tilts. Still having pain with sitting, continued discussion on positioning and decreasing PPT in seated position. Pt still having pain, states minimal relief thus far.   Eval: Patient presents with primary complaint of increased pain in low back, following recent surgery for compression fracture. She has increased muscle tension and tenderness in bil low thoracic and lumbar region. She has increased stiffness in t and l spine, with decreased ROM, and poor movement mechanics. Pt with decreased ability for full functional activities, and will benefit from skilled PT to improve deficits and pain.   OBJECTIVE IMPAIRMENTS: decreased activity tolerance, decreased mobility, difficulty walking, decreased ROM, decreased strength, hypomobility, impaired perceived functional ability, increased muscle spasms, impaired flexibility, improper body mechanics, and pain.   ACTIVITY LIMITATIONS: carrying, lifting, bending, sitting, standing, squatting, transfers, and locomotion level  PARTICIPATION LIMITATIONS: meal prep, cleaning, laundry, driving, shopping, and community activity  PERSONAL FACTORS:  none  are also affecting  patient's functional outcome.   REHAB POTENTIAL: Good  CLINICAL DECISION MAKING: Stable/uncomplicated  EVALUATION COMPLEXITY: Low   GOALS: Goals reviewed with patient? Yes  SHORT TERM GOALS: Target date: 01/19/2023  Pt to be independent with initial HEP  Goal status: INITIAL  2.  Pt to report comfortable seated positioning at home in chair or couch.   Goal status: INITIAL   LONG TERM GOALS: Target date: 03/02/2023    Pt to be independent with final HEP  Goal status: INITIAL  2.  Pt to report decreased pain in back to 0-3/10 with activity and IADLs.   Goal status: INITIAL  3.  Pt to demo improved postural awareness for standing and sitting, for decreased pressure and pain.   Goal status: INITIAL  4.  Pt to demo optimal bend, lift, squat mechanics to improve pain and ability for IADLs.    Goal status: INITIAL   PLAN:  PT FREQUENCY: 1-2x/week  PT DURATION: 8 weeks  PLANNED INTERVENTIONS: Therapeutic exercises, Therapeutic activity, Neuromuscular re-education, Balance training, Gait training, Patient/Family education, Self Care, Joint mobilization, Joint manipulation, Orthotic/Fit training, DME instructions, Aquatic Therapy, Dry Needling, Electrical stimulation, Spinal manipulation, Spinal mobilization, Cryotherapy, Moist heat, Taping, Traction, Ultrasound, Ionotophoresis 4mg /ml Dexamethasone, and Manual therapy.  PLAN FOR NEXT SESSION: light flexion/lumbar/hip mobility, decompressive positioning, review HEP   Lyndee Hensen, PT, DPT 12:17 PM  01/19/23 

## 2023-01-19 ENCOUNTER — Encounter: Payer: Self-pay | Admitting: Physical Therapy

## 2023-01-19 DIAGNOSIS — H4051X3 Glaucoma secondary to other eye disorders, right eye, severe stage: Secondary | ICD-10-CM | POA: Diagnosis not present

## 2023-01-19 DIAGNOSIS — H524 Presbyopia: Secondary | ICD-10-CM | POA: Diagnosis not present

## 2023-01-19 DIAGNOSIS — H52221 Regular astigmatism, right eye: Secondary | ICD-10-CM | POA: Diagnosis not present

## 2023-01-19 DIAGNOSIS — Z961 Presence of intraocular lens: Secondary | ICD-10-CM | POA: Diagnosis not present

## 2023-01-23 ENCOUNTER — Ambulatory Visit (INDEPENDENT_AMBULATORY_CARE_PROVIDER_SITE_OTHER): Payer: Medicare Other | Admitting: Physical Therapy

## 2023-01-23 ENCOUNTER — Encounter: Payer: Self-pay | Admitting: Physical Therapy

## 2023-01-23 DIAGNOSIS — M5459 Other low back pain: Secondary | ICD-10-CM | POA: Diagnosis not present

## 2023-01-23 NOTE — Therapy (Signed)
OUTPATIENT PHYSICAL THERAPY THORACOLUMBAR TREATMENT   Patient Name: Julia [name] MRN: [id number] DOB:01-27-1936, 87 y.o., female Today's Date: 01/23/2023  END OF SESSION:  PT End of Session - 01/23/23 1348     Visit Number 6    Number of Visits 16    Date for PT Re-Evaluation 03/02/23    Authorization Type Medicare    PT Start Time 1346    PT Stop Time 1429    PT Time Calculation (min) 43 min    Activity Tolerance Patient tolerated treatment well    Behavior During Therapy [facility] for tasks assessed/performed               Past Medical History:  Diagnosis Date   Aortic insufficiency    a. mild by echo 2016. // Echo 06/2022: EF 50-55, no RWMA, septal movement consistent with LBBB, GR 1 DD, normal RVSF, small pericardial effusion, mild MR, mild AI, AV sclerosis without stenosis, RA pressure 3   Chest pain    Nuclear March, 2011, normal, ejection fraction 64%   Coronary artery disease involving native coronary artery of native heart without angina pectoris 07/11/2022   CCTA 9/23: CAC score 158 (49th percentile); non-obstructive CAD (mLAD 25-49, pOM1 25-49, pRCA 1-24, PLV 1-24); small pericardial effusion   Dyslipidemia    Elevated blood pressure reading    GERD (gastroesophageal reflux disease)    Patient sometimes has slight right lower jaw discomfort at the time of her reflux   LBBB (left bundle branch block)    Pericardial effusion    Echo December, 2009, small, incidental   Shoulder pain    Left scapular pain, appeared to be radicular   Past Surgical History:  Procedure Laterality Date   25 GAUGE PARS PLANA VITRECTOMY WITH 20 GAUGE MVR PORT FOR MACULAR HOLE  2000   BREAST BIOPSY  1983   benign   CHOLECYSTECTOMY  1984   IR KYPHO LUMBAR INC FX REDUCE BONE BX UNI/BIL CANNULATION INC/IMAGING  12/21/2022   IR RADIOLOGIST EVAL & MGMT  12/19/2022   IR RADIOLOGIST EVAL & MGMT  01/04/2023   MELANOMA EXCISION  2012, 2013   [place]   2002   Patient Active Problem List   Diagnosis Date Noted   Osteoporosis 01/02/2023   Coronary artery disease involving native coronary artery of native heart without angina pectoris 07/11/2022   Aortic atherosclerosis ([place]) 07/11/2022   Dyslipidemia    Blood pressure alteration    GERD (gastroesophageal reflux disease)    Aortic insufficiency    Shoulder pain    Precordial chest pain    Left bundle branch block 01/03/2010    PCP: Rochele Raring  REFERRING PROVIDER: Lynne Leader   REFERRING DIAG: Lynne Leader  Rationale for Evaluation and Treatment: Rehabilitation  THERAPY DIAG:  Other low back pain  ONSET DATE:   SUBJECTIVE:  SUBJECTIVE STATEMENT: 01/23/2023 Pt states a couple days of less pain, not all day. Mornings have been better, still painful in afternoons, even when sitting.    Eval: 12/21/22 pt had kyphoplasty for L1 fracture. States no fall, etc, does have Osteoporosis. Reports No other breaks.  States Pain in bil low back L>R.  No pain meds helping/tylenol etc.   Better: heat, constant pain,  Did go to [facility], classes, no aquatic, is walking around house some for exercise.    PERTINENT HISTORY:  osteoporosis,   PAIN:  Are you having pain? Yes: NPRS scale: 9/10 Pain location: bil low lumbar Pain description: achey Aggravating factors: sitting, standing, unable to state Relieving factors: none stated   PRECAUTIONS: Other: Osteoporosis  WEIGHT BEARING RESTRICTIONS: No  FALLS:  Has patient fallen in last 6 months? No  PLOF: Independent  PATIENT GOALS: Decreased pain in back  NEXT MD VISIT:   OBJECTIVE:   DIAGNOSTIC FINDINGS:   PATIENT SURVEYS:   COGNITION: Overall cognitive status: Within functional limits for tasks assessed     SENSATION:   POSTURE: sway back  posture   PALPATION: Minimal pain to palpate today. Tightness in bil mid and low thoracic paraspinals , lumbar and QL.   LUMBAR ROM:   AROM eval  Flexion Mod limitation  Extension Mod/significant limitation  Right lateral flexion Mod limitation  Left lateral flexion Mod limitation  Right rotation   Left rotation    (Blank rows = not tested)  LOWER EXTREMITY ROM:    Hips: mild limitation for ER and IR bil,  Knees: WFL  LOWER EXTREMITY MMT:    MMT Right eval Left eval  Hip flexion 4 4  Hip extension    Hip abduction 4 4  Hip adduction    Hip internal rotation    Hip external rotation    Knee flexion 5 5  Knee extension 5 5  Ankle dorsiflexion    Ankle plantarflexion    Ankle inversion    Ankle eversion     (Blank rows = not tested)  LUMBAR SPECIAL TESTS:   GAIT:   TODAY'S TREATMENT:                                                                                                                              DATE:   01/23/23: Therapeutic Exercise: Aerobic: Supine:    pelvic tilts x 15;  decompression reaches 5 sec x 10 bil;   SLR 2 x 5 bil; supine march with TA   2 x 10;  Clams with TA x 20;  bridging x 10 (difficult, no pain ) ;  Seated:  seated hip hinge x 15;  Standing: education with visual cuing in mirror, on optimal standing posture to decrease sway back, with standing and walking.  Stretches:  LTR x 10;  Neuromuscular Re-education: Manual Therapy:  Long leg distraction x 2 min bil;  Therapeutic Activity: Self Care: Modalities: heat pack to lumbar spine  with supine ther ex.   01/18/23: Therapeutic Exercise: Aerobic: Supine:  SKTC 30 sec x 3;  pelvic tilts x 15;  decompression reaches 5 sec x 5;  supine march with TA x 10;   trial bridging x 5 (difficult) ;  Seated: Pelvic tilts x 15;  seated hip hinge x 15;  Standing: Stretches:  LTR x 10;  Seated HSS 30 sec x 4 bil;  Neuromuscular Re-education: Manual Therapy:  Therapeutic Activity: Self  Care:  01/15/23: Therapeutic Exercise: Aerobic: Supine:  SKTC 30 sec x 3;  pelvic tilts x 15;  OP heel press x 10 bil; supine march with TA x 10;  TA x 10 with education on achieving contraction;   Seated: Pelvic tilts x 15;  sit to stand 2 x 5 with education on back posture and TA;  Standing: Stretches:  LTR x 10;  Seated HSS 30 sec x 4 bil;  Neuromuscular Re-education: Manual Therapy: STM to L lumbar paraspinals and QL in s/l.  Therapeutic Activity: Self Care:   PATIENT EDUCATION:  Education details: Updated and reviewed HEP Person educated: Patient Education method: Explanation, Demonstration, Tactile cues, Verbal cues, and Handouts Education comprehension: verbalized understanding, returned demonstration, verbal cues required, tactile cues required, and needs further education  HOME EXERCISE PROGRAM: Access Code: [email or web address]   ASSESSMENT:  CLINICAL IMPRESSION: 01/23/2023  Pt has had some mild pain relief just since last visit. Continued progression of light strength today. Focus on education for optimal standing posture, for trying to decrease sway back posture in standing and with walking, for decreased pressure and pain.   Eval: Patient presents with primary complaint of increased pain in low back, following recent surgery for compression fracture. She has increased muscle tension and tenderness in bil low thoracic and lumbar region. She has increased stiffness in t and l spine, with decreased ROM, and poor movement mechanics. Pt with decreased ability for full functional activities, and will benefit from skilled PT to improve deficits and pain.   OBJECTIVE IMPAIRMENTS: decreased activity tolerance, decreased mobility, difficulty walking, decreased ROM, decreased strength, hypomobility, impaired perceived functional ability, increased muscle spasms, impaired flexibility, improper body mechanics, and pain.   ACTIVITY LIMITATIONS: carrying, lifting, bending, sitting, standing,  squatting, transfers, and locomotion level  PARTICIPATION LIMITATIONS: meal prep, cleaning, laundry, driving, shopping, and community activity  PERSONAL FACTORS:  none  are also affecting patient's functional outcome.   REHAB POTENTIAL: Good  CLINICAL DECISION MAKING: Stable/uncomplicated  EVALUATION COMPLEXITY: Low   GOALS: Goals reviewed with patient? Yes  SHORT TERM GOALS: Target date: 01/19/2023  Pt to be independent with initial HEP  Goal status: INITIAL  2.  Pt to report comfortable seated positioning at home in chair or couch.   Goal status: INITIAL   LONG TERM GOALS: Target date: 03/02/2023    Pt to be independent with final HEP  Goal status: INITIAL  2.  Pt to report decreased pain in back to 0-3/10 with activity and IADLs.   Goal status: INITIAL  3.  Pt to demo improved postural awareness for standing and sitting, for decreased pressure and pain.   Goal status: INITIAL  4.  Pt to demo optimal bend, lift, squat mechanics to improve pain and ability for IADLs.    Goal status: INITIAL   PLAN:  PT FREQUENCY: 1-2x/week  PT DURATION: 8 weeks  PLANNED INTERVENTIONS: Therapeutic exercises, Therapeutic activity, Neuromuscular re-education, Balance training, Gait training, Patient/Family education, Self Care, Joint mobilization, Joint manipulation, Orthotic/Fit training, DME instructions, Aquatic Therapy,  Dry Needling, Electrical stimulation, Spinal manipulation, Spinal mobilization, Cryotherapy, Moist heat, Taping, Traction, Ultrasound, Ionotophoresis 4mg /ml Dexamethasone, and Manual therapy.  PLAN FOR NEXT SESSION: light flexion/lumbar/hip mobility, decompressive positioning, review HEP   Lyndee Hensen, PT, DPT 7:52 PM  01/23/23

## 2023-01-25 ENCOUNTER — Encounter: Payer: Medicare Other | Admitting: Physical Therapy

## 2023-01-29 NOTE — Progress Notes (Unsigned)
   Shirlyn Goltz, PhD, LAT, ATC acting as a scribe for Lynne Leader, MD.  Julia [name] is a 87 y.o. female who presents to [place] [facility] today for f/u low back pain. A kyphoplasty was preformed on 12/21/22 for her L1 compression fx. Pt was last seen by Dr. Georgina Snell on 01/02/23 and was referred to PT, completing 6 visits. Today, pt reports ***  Dx imaging: 12/12/22 L-spine MRI             12/07/22 L-spine XR  Pertinent review of systems: ***  Relevant historical information: ***   Exam:  There were no vitals taken for this visit. General: Well Developed, well nourished, and in no acute distress.   MSK: ***    Lab and Radiology Results No results found for this or any previous visit (from the past 72 hour(s)). No results found.     Assessment and Plan: 87 y.o. female with ***   PDMP not reviewed this encounter. No orders of the defined types were placed in this encounter.  No orders of the defined types were placed in this encounter.    Discussed warning signs or symptoms. Please see discharge instructions. Patient expresses understanding.   ***

## 2023-01-30 ENCOUNTER — Encounter: Payer: Self-pay | Admitting: Family Medicine

## 2023-01-30 ENCOUNTER — Ambulatory Visit (INDEPENDENT_AMBULATORY_CARE_PROVIDER_SITE_OTHER): Payer: Medicare Other | Admitting: Family Medicine

## 2023-01-30 VITALS — BP 124/82 | HR 80 | Ht 66.0 in | Wt 104.6 lb

## 2023-01-30 DIAGNOSIS — S32010S Wedge compression fracture of first lumbar vertebra, sequela: Secondary | ICD-10-CM

## 2023-01-30 DIAGNOSIS — S32010A Wedge compression fracture of first lumbar vertebra, initial encounter for closed fracture: Secondary | ICD-10-CM | POA: Insufficient documentation

## 2023-01-30 DIAGNOSIS — G8929 Other chronic pain: Secondary | ICD-10-CM | POA: Insufficient documentation

## 2023-01-30 DIAGNOSIS — M545 Low back pain, unspecified: Secondary | ICD-10-CM | POA: Insufficient documentation

## 2023-01-30 DIAGNOSIS — M8000XD Age-related osteoporosis with current pathological fracture, unspecified site, subsequent encounter for fracture with routine healing: Secondary | ICD-10-CM

## 2023-01-30 LAB — COMPREHENSIVE METABOLIC PANEL
ALT: 15 U/L (ref 0–35)
AST: 17 U/L (ref 0–37)
Albumin: 4.3 g/dL (ref 3.5–5.2)
Alkaline Phosphatase: 67 U/L (ref 39–117)
BUN: 14 mg/dL (ref 6–23)
CO2: 31 mEq/L (ref 19–32)
Calcium: 9.7 mg/dL (ref 8.4–10.5)
Chloride: 99 mEq/L (ref 96–112)
Creatinine, Ser: 0.74 mg/dL (ref 0.40–1.20)
GFR: 74.2 mL/min (ref >60.00)
Glucose, Bld: 111 mg/dL — ABNORMAL HIGH (ref 70–99)
Potassium: 4.5 mEq/L (ref 3.5–5.1)
Sodium: 134 mEq/L — ABNORMAL LOW (ref 135–145)
Total Bilirubin: 0.5 mg/dL (ref 0.2–1.2)
Total Protein: 6.9 g/dL (ref 6.0–8.3)

## 2023-01-30 LAB — MAGNESIUM: Magnesium: 2.4 mg/dL (ref 1.5–2.5)

## 2023-01-30 LAB — VITAMIN D 25 HYDROXY (VIT D DEFICIENCY, FRACTURES): VITD: 60.54 ng/mL (ref 30.00–100.00)

## 2023-01-30 LAB — PHOSPHORUS: Phosphorus: 4.1 mg/dL (ref 2.3–4.6)

## 2023-01-30 NOTE — Patient Instructions (Addendum)
Thank you for coming in today.   Please get labs today before you leave   Facet arthritis  We will check with your cardiologist about [place]  Check back in 1 month

## 2023-02-01 ENCOUNTER — Encounter: Payer: Self-pay | Admitting: Physical Therapy

## 2023-02-01 ENCOUNTER — Ambulatory Visit (INDEPENDENT_AMBULATORY_CARE_PROVIDER_SITE_OTHER): Payer: Medicare Other | Admitting: Physical Therapy

## 2023-02-01 ENCOUNTER — Telehealth: Payer: Self-pay

## 2023-02-01 DIAGNOSIS — M5459 Other low back pain: Secondary | ICD-10-CM | POA: Diagnosis not present

## 2023-02-01 NOTE — Therapy (Signed)
OUTPATIENT PHYSICAL THERAPY THORACOLUMBAR TREATMENT   Patient Name: Julia [name] MRN: [id number] DOB:10-08-1936, 87 y.o., female Today's Date: 02/01/2023  END OF SESSION:  PT End of Session - 02/01/23 1216     Visit Number 7    Number of Visits 16    Date for PT Re-Evaluation 03/02/23    Authorization Type Medicare    PT Start Time 1214    PT Stop Time [id number]    PT Time Calculation (min) 44 min    Activity Tolerance Patient tolerated treatment well    Behavior During Therapy [facility] for tasks assessed/performed               Past Medical History:  Diagnosis Date   Aortic insufficiency    a. mild by echo 2016. // Echo 06/2022: EF 50-55, no RWMA, septal movement consistent with LBBB, GR 1 DD, normal RVSF, small pericardial effusion, mild MR, mild AI, AV sclerosis without stenosis, RA pressure 3   Chest pain    Nuclear March, 2011, normal, ejection fraction 64%   Coronary artery disease involving native coronary artery of native heart without angina pectoris 07/11/2022   CCTA 9/23: CAC score 158 (49th percentile); non-obstructive CAD (mLAD 25-49, pOM1 25-49, pRCA 1-24, PLV 1-24); small pericardial effusion   Dyslipidemia    Elevated blood pressure reading    GERD (gastroesophageal reflux disease)    Patient sometimes has slight right lower jaw discomfort at the time of her reflux   LBBB (left bundle branch block)    Pericardial effusion    Echo December, 2009, small, incidental   Shoulder pain    Left scapular pain, appeared to be radicular   Past Surgical History:  Procedure Laterality Date   25 GAUGE PARS PLANA VITRECTOMY WITH 20 GAUGE MVR PORT FOR MACULAR HOLE  2000   BREAST BIOPSY  1983   benign   CHOLECYSTECTOMY  1984   IR KYPHO LUMBAR INC FX REDUCE BONE BX UNI/BIL CANNULATION INC/IMAGING  12/21/2022   IR RADIOLOGIST EVAL & MGMT  12/19/2022   IR RADIOLOGIST EVAL & MGMT  01/04/2023   MELANOMA EXCISION  2012, 2013   [place]  2002    Patient Active Problem List   Diagnosis Date Noted   Compression fracture of L1 lumbar vertebra 01/30/2023   Chronic bilateral low back pain without sciatica 01/30/2023   Osteoporosis 01/02/2023   Coronary artery disease involving native coronary artery of native heart without angina pectoris 07/11/2022   Aortic atherosclerosis 07/11/2022   Dyslipidemia    Blood pressure alteration    GERD (gastroesophageal reflux disease)    Aortic insufficiency    Shoulder pain    Precordial chest pain    Left bundle branch block 01/03/2010    PCP: Rochele Raring  REFERRING PROVIDER: Lynne Leader   REFERRING DIAG: Lynne Leader  Rationale for Evaluation and Treatment: Rehabilitation  THERAPY DIAG:  Other low back pain  ONSET DATE:   SUBJECTIVE:  SUBJECTIVE STATEMENT: 02/01/2023 Pt states variable pain, still having quite a bit of pain at times, and others is a bit less. Has been doing HEP.    Eval: 12/21/22 pt had kyphoplasty for L1 fracture. States no fall, etc, does have Osteoporosis. Reports No other breaks.  States Pain in bil low back L>R.  No pain meds helping/tylenol etc.   Better: heat, constant pain,  Did go to [facility], classes, no aquatic, is walking around house some for exercise.    PERTINENT HISTORY:  osteoporosis,   PAIN:  Are you having pain? Yes: NPRS scale: 9/10 Pain location: bil low lumbar Pain description: achey Aggravating factors: sitting, standing, unable to state Relieving factors: none stated   PRECAUTIONS: Other: Osteoporosis  WEIGHT BEARING RESTRICTIONS: No  FALLS:  Has patient fallen in last 6 months? No  PLOF: Independent  PATIENT GOALS: Decreased pain in back  NEXT MD VISIT:   OBJECTIVE:   DIAGNOSTIC FINDINGS:   PATIENT SURVEYS:   COGNITION: Overall cognitive  status: Within functional limits for tasks assessed     SENSATION:   POSTURE: sway back posture   PALPATION: Minimal pain to palpate today. Tightness in bil mid and low thoracic paraspinals , lumbar and QL.   LUMBAR ROM:   AROM eval  Flexion Mod limitation  Extension Mod/significant limitation  Right lateral flexion Mod limitation  Left lateral flexion Mod limitation  Right rotation   Left rotation    (Blank rows = not tested)  LOWER EXTREMITY ROM:    Hips: mild limitation for ER and IR bil,  Knees: WFL  LOWER EXTREMITY MMT:    MMT Right eval Left eval  Hip flexion 4 4  Hip extension    Hip abduction 4 4  Hip adduction    Hip internal rotation    Hip external rotation    Knee flexion 5 5  Knee extension 5 5  Ankle dorsiflexion    Ankle plantarflexion    Ankle inversion    Ankle eversion     (Blank rows = not tested)  LUMBAR SPECIAL TESTS:   GAIT:   TODAY'S TREATMENT:                                                                                                                              DATE:   02/01/23: Therapeutic Exercise: Aerobic: Supine:    pelvic tilts x 15;    SLR 2 x 10 bil;   supine march with TA   2 x 10;  bridging x 10 (difficult, mild pain at start position);  S/L hip abd x 10 bil; education and practice for supine to sit log roll/transfer x 3;  Seated:  cuing for achieving upright seated posture with neutral spine Standing:  Stretches:  LTR x 15;  Neuromuscular Re-education: Manual Therapy:  Long leg distraction x 2 min bil; STM/TPR to L lumbar paraspinals, light PA mobs low thoracic and lumbar; ( pililow under hips) ;  S/L decompression stretch with manual assist x 10;  Therapeutic Activity: Self Care: Modalities: heat pack to lumbar spine with supine ther ex.    01/23/23: Therapeutic Exercise: Aerobic: Supine:    pelvic tilts x 15;  decompression reaches 5 sec x 10 bil;   SLR 2 x 5 bil; supine march with TA   2 x 10;  Clams with TA  x 20;  bridging x 10 (difficult, no pain ) ;  Seated:  seated hip hinge x 15;  Standing: education with visual cuing in mirror, on optimal standing posture to decrease sway back, with standing and walking.  Stretches:  LTR x 10;  Neuromuscular Re-education: Manual Therapy:  Long leg distraction x 2 min bil;  Therapeutic Activity: Self Care: Modalities: heat pack to lumbar spine with supine ther ex.   01/18/23: Therapeutic Exercise: Aerobic: Supine:  SKTC 30 sec x 3;  pelvic tilts x 15;  decompression reaches 5 sec x 5;  supine march with TA x 10;   trial bridging x 5 (difficult) ;  Seated: Pelvic tilts x 15;  seated hip hinge x 15;  Standing: Stretches:  LTR x 10;  Seated HSS 30 sec x 4 bil;  Neuromuscular Re-education: Manual Therapy:  Therapeutic Activity: Self Care:  01/15/23: Therapeutic Exercise: Aerobic: Supine:  SKTC 30 sec x 3;  pelvic tilts x 15;  OP heel press x 10 bil; supine march with TA x 10;  TA x 10 with education on achieving contraction;   Seated: Pelvic tilts x 15;  sit to stand 2 x 5 with education on back posture and TA;  Standing: Stretches:  LTR x 10;  Seated HSS 30 sec x 4 bil;  Neuromuscular Re-education: Manual Therapy: STM to L lumbar paraspinals and QL in s/l.  Therapeutic Activity: Self Care:   PATIENT EDUCATION:  Education details: Updated and reviewed HEP Person educated: Patient Education method: Explanation, Demonstration, Tactile cues, Verbal cues, and Handouts Education comprehension: verbalized understanding, returned demonstration, verbal cues required, tactile cues required, and needs further education  HOME EXERCISE PROGRAM: Access Code: [email or web address]   ASSESSMENT:  CLINICAL IMPRESSION: 02/01/2023  Pt with no soreness with palpation or light mobilization of back today, which has been sore in the past. She has continued pain in sitting and with standing activity at home. Encouraged change of position often at home and when painful.     Eval: Patient presents with primary complaint of increased pain in low back, following recent surgery for compression fracture. She has increased muscle tension and tenderness in bil low thoracic and lumbar region. She has increased stiffness in t and l spine, with decreased ROM, and poor movement mechanics. Pt with decreased ability for full functional activities, and will benefit from skilled PT to improve deficits and pain.   OBJECTIVE IMPAIRMENTS: decreased activity tolerance, decreased mobility, difficulty walking, decreased ROM, decreased strength, hypomobility, impaired perceived functional ability, increased muscle spasms, impaired flexibility, improper body mechanics, and pain.   ACTIVITY LIMITATIONS: carrying, lifting, bending, sitting, standing, squatting, transfers, and locomotion level  PARTICIPATION LIMITATIONS: meal prep, cleaning, laundry, driving, shopping, and community activity  PERSONAL FACTORS:  none  are also affecting patient's functional outcome.   REHAB POTENTIAL: Good  CLINICAL DECISION MAKING: Stable/uncomplicated  EVALUATION COMPLEXITY: Low   GOALS: Goals reviewed with patient? Yes  SHORT TERM GOALS: Target date: 01/19/2023  Pt to be independent with initial HEP  Goal status: INITIAL  2.  Pt to report comfortable seated positioning at home in chair  or couch.   Goal status: INITIAL   LONG TERM GOALS: Target date: 03/02/2023    Pt to be independent with final HEP  Goal status: INITIAL  2.  Pt to report decreased pain in back to 0-3/10 with activity and IADLs.   Goal status: INITIAL  3.  Pt to demo improved postural awareness for standing and sitting, for decreased pressure and pain.   Goal status: INITIAL  4.  Pt to demo optimal bend, lift, squat mechanics to improve pain and ability for IADLs.    Goal status: INITIAL   PLAN:  PT FREQUENCY: 1-2x/week  PT DURATION: 8 weeks  PLANNED INTERVENTIONS: Therapeutic exercises, Therapeutic  activity, Neuromuscular re-education, Balance training, Gait training, Patient/Family education, Self Care, Joint mobilization, Joint manipulation, Orthotic/Fit training, DME instructions, Aquatic Therapy, Dry Needling, Electrical stimulation, Spinal manipulation, Spinal mobilization, Cryotherapy, Moist heat, Taping, Traction, Ultrasound, Ionotophoresis 4mg /ml Dexamethasone, and Manual therapy.  PLAN FOR NEXT SESSION: light flexion/lumbar/hip mobility, decompressive positioning, review HEP   Lyndee Hensen, PT, DPT 1:36 PM  02/01/23

## 2023-02-01 NOTE — Telephone Encounter (Signed)
Gregor Hams, MD  [place] look okay.  Okay to proceed to [employer]  Pt ready for scheduling on or after 02/01/23  Out-of-pocket cost due at time of visit: $0  Primary: Medicare [place] co-insurance: 20% (approximately $450) Admin fee co-insurance: 20% (approximately $25)  Secondary: AARP Medicare Supp Plan F [place] co-insurance: Covers Medicare Part B co-insurance Admin fee co-insurance: Covers Medicare Part B co-insurance  Deductible:  Covered by secondary  Prior Auth: NOT required PA# Valid:   ** This summary of benefits is an estimation of the patient's out-of-pocket cost. Exact cost may vary based on individual plan coverage.

## 2023-02-01 NOTE — Telephone Encounter (Signed)
Per lab results note from Dr. Georgina Snell:   Gregor Hams, MD  [place] look okay.  Okay to proceed to [employer]

## 2023-02-01 NOTE — Progress Notes (Signed)
Labs look okay.  Okay to proceed to [employer]

## 2023-02-01 NOTE — Telephone Encounter (Signed)
Checking benefits for [employer] and [employer].

## 2023-02-01 NOTE — Telephone Encounter (Signed)
Will proceed with [employer] over [employer].

## 2023-02-02 NOTE — Telephone Encounter (Signed)
Called patient to schedule. She questioned if this was okay to do with her heart issue and wanted to know if Dr Denyse Amass talked to her cardiologist?

## 2023-02-06 ENCOUNTER — Encounter: Payer: Self-pay | Admitting: Physical Therapy

## 2023-02-06 ENCOUNTER — Ambulatory Visit (INDEPENDENT_AMBULATORY_CARE_PROVIDER_SITE_OTHER): Payer: Medicare Other | Admitting: Physical Therapy

## 2023-02-06 DIAGNOSIS — M5459 Other low back pain: Secondary | ICD-10-CM | POA: Diagnosis not present

## 2023-02-06 NOTE — Therapy (Signed)
OUTPATIENT PHYSICAL THERAPY THORACOLUMBAR TREATMENT   Patient Name: Julia [name] MRN: [id number] DOB:Apr 05, 1936, 87 y.o., female Today's Date: 02/06/2023  END OF SESSION:  PT End of Session - 02/06/23 1232     Visit Number 8    Number of Visits 16    Date for PT Re-Evaluation 03/02/23    Authorization Type Medicare    PT Start Time 1220    PT Stop Time 1300    PT Time Calculation (min) 40 min    Activity Tolerance Patient tolerated treatment well    Behavior During Therapy [facility] for tasks assessed/performed                Past Medical History:  Diagnosis Date   Aortic insufficiency    a. mild by echo 2016. // Echo 06/2022: EF 50-55, no RWMA, septal movement consistent with LBBB, GR 1 DD, normal RVSF, small pericardial effusion, mild MR, mild AI, AV sclerosis without stenosis, RA pressure 3   Chest pain    Nuclear March, 2011, normal, ejection fraction 64%   Coronary artery disease involving native coronary artery of native heart without angina pectoris 07/11/2022   CCTA 9/23: CAC score 158 (49th percentile); non-obstructive CAD (mLAD 25-49, pOM1 25-49, pRCA 1-24, PLV 1-24); small pericardial effusion   Dyslipidemia    Elevated blood pressure reading    GERD (gastroesophageal reflux disease)    Patient sometimes has slight right lower jaw discomfort at the time of her reflux   LBBB (left bundle branch block)    Pericardial effusion    Echo December, 2009, small, incidental   Shoulder pain    Left scapular pain, appeared to be radicular   Past Surgical History:  Procedure Laterality Date   25 GAUGE PARS PLANA VITRECTOMY WITH 20 GAUGE MVR PORT FOR MACULAR HOLE  2000   BREAST BIOPSY  1983   benign   CHOLECYSTECTOMY  1984   IR KYPHO LUMBAR INC FX REDUCE BONE BX UNI/BIL CANNULATION INC/IMAGING  12/21/2022   IR RADIOLOGIST EVAL & MGMT  12/19/2022   IR RADIOLOGIST EVAL & MGMT  01/04/2023   MELANOMA EXCISION  2012, 2013   NASAL SEPTUM SURGERY  1993   ROTATOR CUFF REPAIR   2002   Patient Active Problem List   Diagnosis Date Noted   Compression fracture of L1 lumbar vertebra 01/30/2023   Chronic bilateral low back pain without sciatica 01/30/2023   Osteoporosis 01/02/2023   Coronary artery disease involving native coronary artery of native heart without angina pectoris 07/11/2022   Aortic atherosclerosis 07/11/2022   Dyslipidemia    Blood pressure alteration    GERD (gastroesophageal reflux disease)    Aortic insufficiency    Shoulder pain    Precordial chest pain    Left bundle branch block 01/03/2010    PCP: Hillard Danker  REFERRING PROVIDER: [name]   REFERRING DIAG: [name]  Rationale for Evaluation and Treatment: Rehabilitation  THERAPY DIAG:  Other low back pain  ONSET DATE:   SUBJECTIVE:  SUBJECTIVE STATEMENT: 02/06/2023 Pt states no pain fri, sat, sun. Notes increased pain, regular pain yesterday and today, up to 7-8/10.    Eval: 12/21/22 pt had kyphoplasty for L1 fracture. States no fall, etc, does have Osteoporosis. Reports No other breaks.  States Pain in bil low back L>R.  No pain meds helping/tylenol etc.   Better: heat, constant pain,  Did go to [facility], classes, no aquatic, is walking around house some for exercise.    PERTINENT HISTORY:  osteoporosis,   PAIN:  Are you having pain? Yes: NPRS scale: 9/10 Pain location: bil low lumbar Pain description: achey Aggravating factors: sitting, standing, unable to state Relieving factors: none stated   PRECAUTIONS: Other: Osteoporosis  WEIGHT BEARING RESTRICTIONS: No  FALLS:  Has patient fallen in last 6 months? No  PLOF: Independent  PATIENT GOALS: Decreased pain in back  NEXT MD VISIT:   OBJECTIVE:   DIAGNOSTIC FINDINGS:   PATIENT SURVEYS:   COGNITION: Overall cognitive status:  Within functional limits for tasks assessed     SENSATION:   POSTURE: sway back posture   PALPATION: Minimal pain to palpate today. Tightness in bil mid and low thoracic paraspinals , lumbar and QL.   LUMBAR ROM:   AROM eval  Flexion Mod limitation  Extension Mod/significant limitation  Right lateral flexion Mod limitation  Left lateral flexion Mod limitation  Right rotation   Left rotation    (Blank rows = not tested)  LOWER EXTREMITY ROM:    Hips: mild limitation for ER and IR bil,  Knees: WFL  LOWER EXTREMITY MMT:    MMT Right eval Left eval  Hip flexion 4 4  Hip extension    Hip abduction 4 4  Hip adduction    Hip internal rotation    Hip external rotation    Knee flexion 5 5  Knee extension 5 5  Ankle dorsiflexion    Ankle plantarflexion    Ankle inversion    Ankle eversion     (Blank rows = not tested)  LUMBAR SPECIAL TESTS:   GAIT:   TODAY'S TREATMENT:                                                                                                                              DATE:   02/06/23: Therapeutic Exercise: Aerobic: Supine:    pelvic tilts x 15;   SLR 2 x 10 bil;   supine march with TA   2 x 10 with GTB ;  bridging x 10   Seated:   Standing:  Stretches:  LTR x 15;  DKTC 30 sec x 2;  Neuromuscular Re-education: Manual Therapy:  Long leg distraction x 2 min bil; STM/TPR to L lumbar paraspinals, light PA mobs low thoracic and lumbar; ( pililow under hips) ;  Trigger Point Dry-Needling  Treatment instructions: Expect mild to moderate muscle soreness. S/S of pneumothorax if dry needled over a lung field, and to seek immediate medical  attention should they occur. Patient verbalized understanding of these instructions and education.  Patient Consent Given: Yes Education handout provided: Yes Muscles treated: lumbar multifidi L , L1-3  Electrical stimulation performed: No Parameters: N/A Treatment response/outcome: palpable increase in muscle  length.    02/01/23: Therapeutic Exercise: Aerobic: Supine:    pelvic tilts x 15;    SLR 2 x 10 bil;   supine march with TA   2 x 10;  bridging x 10 (difficult, mild pain at start position);  S/L hip abd x 10 bil; education and practice for supine to sit log roll/transfer x 3;  Seated:  cuing for achieving upright seated posture with neutral spine Standing:  Stretches:  LTR x 15;  Neuromuscular Re-education: Manual Therapy:  Long leg distraction x 2 min bil; STM/TPR to L lumbar paraspinals, light PA mobs low thoracic and lumbar; ( pililow under hips) ; S/L decompression stretch with manual assist x 10;  Therapeutic Activity: Self Care: Modalities: heat pack to lumbar spine with supine ther ex.    01/23/23: Therapeutic Exercise: Aerobic: Supine:    pelvic tilts x 15;  decompression reaches 5 sec x 10 bil;   SLR 2 x 5 bil; supine march with TA   2 x 10;  Clams with TA x 20;  bridging x 10 (difficult, no pain ) ;  Seated:  seated hip hinge x 15;  Standing: education with visual cuing in mirror, on optimal standing posture to decrease sway back, with standing and walking.  Stretches:  LTR x 10;  Neuromuscular Re-education: Manual Therapy:  Long leg distraction x 2 min bil;  Therapeutic Activity: Self Care: Modalities: heat pack to lumbar spine with supine ther ex.   01/18/23: Therapeutic Exercise: Aerobic: Supine:  SKTC 30 sec x 3;  pelvic tilts x 15;  decompression reaches 5 sec x 5;  supine march with TA x 10;   trial bridging x 5 (difficult) ;  Seated: Pelvic tilts x 15;  seated hip hinge x 15;  Standing: Stretches:  LTR x 10;  Seated HSS 30 sec x 4 bil;  Neuromuscular Re-education: Manual Therapy:  Therapeutic Activity: Self Care:  01/15/23: Therapeutic Exercise: Aerobic: Supine:  SKTC 30 sec x 3;  pelvic tilts x 15;  OP heel press x 10 bil; supine march with TA x 10;  TA x 10 with education on achieving contraction;   Seated: Pelvic tilts x 15;  sit to stand 2 x 5 with  education on back posture and TA;  Standing: Stretches:  LTR x 10;  Seated HSS 30 sec x 4 bil;  Neuromuscular Re-education: Manual Therapy: STM to L lumbar paraspinals and QL in s/l.  Therapeutic Activity: Self Care:   PATIENT EDUCATION:  Education details: Updated and reviewed HEP Person educated: Patient Education method: Explanation, Demonstration, Tactile cues, Verbal cues, and Handouts Education comprehension: verbalized understanding, returned demonstration, verbal cues required, tactile cues required, and needs further education  HOME EXERCISE PROGRAM: Access Code: [email or web address]   ASSESSMENT:  CLINICAL IMPRESSION: 02/06/2023  Trial for dry needling for L multifidi, pt with good tolerance. She has variable pain, but has had some pain free days last week, which is an improvement. Discussed continuing mobility and strengthening as able.   Eval: Patient presents with primary complaint of increased pain in low back, following recent surgery for compression fracture. She has increased muscle tension and tenderness in bil low thoracic and lumbar region. She has increased stiffness in t and l spine, with decreased  ROM, and poor movement mechanics. Pt with decreased ability for full functional activities, and will benefit from skilled PT to improve deficits and pain.   OBJECTIVE IMPAIRMENTS: decreased activity tolerance, decreased mobility, difficulty walking, decreased ROM, decreased strength, hypomobility, impaired perceived functional ability, increased muscle spasms, impaired flexibility, improper body mechanics, and pain.   ACTIVITY LIMITATIONS: carrying, lifting, bending, sitting, standing, squatting, transfers, and locomotion level  PARTICIPATION LIMITATIONS: meal prep, cleaning, laundry, driving, shopping, and community activity  PERSONAL FACTORS:  none  are also affecting patient's functional outcome.   REHAB POTENTIAL: Good  CLINICAL DECISION MAKING:  Stable/uncomplicated  EVALUATION COMPLEXITY: Low   GOALS: Goals reviewed with patient? Yes  SHORT TERM GOALS: Target date: 01/19/2023  Pt to be independent with initial HEP  Goal status: INITIAL  2.  Pt to report comfortable seated positioning at home in chair or couch.   Goal status: INITIAL   LONG TERM GOALS: Target date: 03/02/2023    Pt to be independent with final HEP  Goal status: INITIAL  2.  Pt to report decreased pain in back to 0-3/10 with activity and IADLs.   Goal status: INITIAL  3.  Pt to demo improved postural awareness for standing and sitting, for decreased pressure and pain.   Goal status: INITIAL  4.  Pt to demo optimal bend, lift, squat mechanics to improve pain and ability for IADLs.    Goal status: INITIAL   PLAN:  PT FREQUENCY: 1-2x/week  PT DURATION: 8 weeks  PLANNED INTERVENTIONS: Therapeutic exercises, Therapeutic activity, Neuromuscular re-education, Balance training, Gait training, Patient/Family education, Self Care, Joint mobilization, Joint manipulation, Orthotic/Fit training, DME instructions, Aquatic Therapy, Dry Needling, Electrical stimulation, Spinal manipulation, Spinal mobilization, Cryotherapy, Moist heat, Taping, Traction, Ultrasound, Ionotophoresis 4mg /ml Dexamethasone, and Manual therapy.  PLAN FOR NEXT SESSION: light flexion/lumbar/hip mobility, decompressive positioning, review HEP   Sedalia Muta, PT, DPT 12:33 PM  02/06/23

## 2023-02-07 NOTE — Telephone Encounter (Signed)
Spoke to patient's husband he will have her call us back.

## 2023-02-08 ENCOUNTER — Encounter: Payer: Self-pay | Admitting: Physical Therapy

## 2023-02-08 ENCOUNTER — Ambulatory Visit (INDEPENDENT_AMBULATORY_CARE_PROVIDER_SITE_OTHER): Payer: Medicare Other | Admitting: Physical Therapy

## 2023-02-08 DIAGNOSIS — M5459 Other low back pain: Secondary | ICD-10-CM

## 2023-02-08 NOTE — Therapy (Signed)
OUTPATIENT PHYSICAL THERAPY THORACOLUMBAR TREATMENT   Patient Name: Julia [name] MRN: [id number] DOB:05/07/1936, 87 y.o., female Today's Date: 02/08/2023  END OF SESSION:  PT End of Session - 02/08/23 1212     Visit Number 9    Number of Visits 16    Date for PT Re-Evaluation 03/02/23    Authorization Type Medicare    PT Start Time 1216    PT Stop Time 1255    PT Time Calculation (min) 39 min    Activity Tolerance Patient tolerated treatment well    Behavior During Therapy [facility] for tasks assessed/performed                Past Medical History:  Diagnosis Date   Aortic insufficiency    a. mild by echo 2016. // Echo 06/2022: EF 50-55, no RWMA, septal movement consistent with LBBB, GR 1 DD, normal RVSF, small pericardial effusion, mild MR, mild AI, AV sclerosis without stenosis, RA pressure 3   Chest pain    Nuclear March, 2011, normal, ejection fraction 64%   Coronary artery disease involving native coronary artery of native heart without angina pectoris 07/11/2022   CCTA 9/23: CAC score 158 (49th percentile); non-obstructive CAD (mLAD 25-49, pOM1 25-49, pRCA 1-24, PLV 1-24); small pericardial effusion   Dyslipidemia    Elevated blood pressure reading    GERD (gastroesophageal reflux disease)    Patient sometimes has slight right lower jaw discomfort at the time of her reflux   LBBB (left bundle branch block)    Pericardial effusion    Echo December, 2009, small, incidental   Shoulder pain    Left scapular pain, appeared to be radicular   Past Surgical History:  Procedure Laterality Date   25 GAUGE PARS PLANA VITRECTOMY WITH 20 GAUGE MVR PORT FOR MACULAR HOLE  2000   BREAST BIOPSY  1983   benign   CHOLECYSTECTOMY  1984   IR KYPHO LUMBAR INC FX REDUCE BONE BX UNI/BIL CANNULATION INC/IMAGING  12/21/2022   IR RADIOLOGIST EVAL & MGMT  12/19/2022   IR RADIOLOGIST EVAL & MGMT  01/04/2023   MELANOMA EXCISION  2012, 2013   NASAL SEPTUM SURGERY  1993   ROTATOR CUFF REPAIR   2002   Patient Active Problem List   Diagnosis Date Noted   Compression fracture of L1 lumbar vertebra 01/30/2023   Chronic bilateral low back pain without sciatica 01/30/2023   Osteoporosis 01/02/2023   Coronary artery disease involving native coronary artery of native heart without angina pectoris 07/11/2022   Aortic atherosclerosis 07/11/2022   Dyslipidemia    Blood pressure alteration    GERD (gastroesophageal reflux disease)    Aortic insufficiency    Shoulder pain    Precordial chest pain    Left bundle branch block 01/03/2010    PCP: Hillard Danker  REFERRING PROVIDER: [name]   REFERRING DIAG: [name]  Rationale for Evaluation and Treatment: Rehabilitation  THERAPY DIAG:  Other low back pain  ONSET DATE:   SUBJECTIVE:  SUBJECTIVE STATEMENT: 02/08/2023   Pt states increased pain in the last couple days from earlier in the week. She had to sit in uncomfortable chair while she got her car fixed and when getting hair done.   Eval: 12/21/22 pt had kyphoplasty for L1 fracture. States no fall, etc, does have Osteoporosis. Reports No other breaks.  States Pain in bil low back L>R.  No pain meds helping/tylenol etc.   Better: heat, constant pain,  Did go to [facility], classes, no aquatic, is walking around house some for exercise.    PERTINENT HISTORY:  osteoporosis,   PAIN:  Are you having pain? Yes: NPRS scale: 9/10 Pain location: bil low lumbar Pain description: achey Aggravating factors: sitting, standing, unable to state Relieving factors: none stated   PRECAUTIONS: Other: Osteoporosis  WEIGHT BEARING RESTRICTIONS: No  FALLS:  Has patient fallen in last 6 months? No  PLOF: Independent  PATIENT GOALS: Decreased pain in back  NEXT MD VISIT:   OBJECTIVE:   DIAGNOSTIC  FINDINGS:   PATIENT SURVEYS:   COGNITION: Overall cognitive status: Within functional limits for tasks assessed     SENSATION:   POSTURE: sway back posture   PALPATION: Minimal pain to palpate today. Tightness in bil mid and low thoracic paraspinals , lumbar and QL.   LUMBAR ROM:   AROM eval  Flexion Mod limitation  Extension Mod/significant limitation  Right lateral flexion Mod limitation  Left lateral flexion Mod limitation  Right rotation   Left rotation    (Blank rows = not tested)  LOWER EXTREMITY ROM:    Hips: mild limitation for ER and IR bil,  Knees: WFL  LOWER EXTREMITY MMT:    MMT Right eval Left eval  Hip flexion 4 4  Hip extension    Hip abduction 4 4  Hip adduction    Hip internal rotation    Hip external rotation    Knee flexion 5 5  Knee extension 5 5  Ankle dorsiflexion    Ankle plantarflexion    Ankle inversion    Ankle eversion     (Blank rows = not tested)  LUMBAR SPECIAL TESTS:   GAIT:   TODAY'S TREATMENT:                                                                                                                              DATE:   02/08/23: Therapeutic Exercise: Aerobic: L1 x 6 min;  Supine:   pelvic tilts x 15;   SLR  x 10 bil;    bridging x 10   Seated:  pelvic tilts x 10- review for when in pain and sitting in chair. Fwd hip hinge motion x 10;  Sit to stand x 10;  Seated fwd flexion x 5 (significant pain)  Standing:  Stretches: DKTC 30 sec x 2;  seated HSS 30 sec x 3 bil;  Neuromuscular Re-education: Manual Therapy:  Long leg distraction x 2 min on L:  STM/TPR to L lumbar paraspinals, L s/l decompression stretch x 2 min;    02/06/23: Therapeutic Exercise: Aerobic: Supine:    pelvic tilts x 15;   SLR 2 x 10 bil;   supine march with TA   2 x 10 with GTB ;  bridging x 10   Seated:   Standing:  Stretches:  LTR x 15;  DKTC 30 sec x 2;  Neuromuscular Re-education: Manual Therapy:  Long leg distraction x 2 min bil;  STM/TPR to L lumbar paraspinals, light PA mobs low thoracic and lumbar; ( pililow under hips) ;  Trigger Point Dry-Needling  Treatment instructions: Expect mild to moderate muscle soreness. S/S of pneumothorax if dry needled over a lung field, and to seek immediate medical attention should they occur. Patient verbalized understanding of these instructions and education.  Patient Consent Given: Yes Education handout provided: Yes Muscles treated: lumbar multifidi L , L1-3  Electrical stimulation performed: No Parameters: N/A Treatment response/outcome: palpable increase in muscle length.    02/01/23: Therapeutic Exercise: Aerobic: Supine:    pelvic tilts x 15;    SLR 2 x 10 bil;   supine march with TA   2 x 10;  bridging x 10 (difficult, mild pain at start position);  S/L hip abd x 10 bil; education and practice for supine to sit log roll/transfer x 3;  Seated:  cuing for achieving upright seated posture with neutral spine Standing:  Stretches:  LTR x 15;  Neuromuscular Re-education: Manual Therapy:  Long leg distraction x 2 min bil; STM/TPR to L lumbar paraspinals, light PA mobs low thoracic and lumbar; ( pililow under hips) ; S/L decompression stretch with manual assist x 10;  Therapeutic Activity: Self Care: Modalities: heat pack to lumbar spine with supine ther ex.    01/23/23: Therapeutic Exercise: Aerobic: Supine:    pelvic tilts x 15;  decompression reaches 5 sec x 10 bil;   SLR 2 x 5 bil; supine march with TA   2 x 10;  Clams with TA x 20;  bridging x 10 (difficult, no pain ) ;  Seated:  seated hip hinge x 15;  Standing: education with visual cuing in mirror, on optimal standing posture to decrease sway back, with standing and walking.  Stretches:  LTR x 10;  Neuromuscular Re-education: Manual Therapy:  Long leg distraction x 2 min bil;  Therapeutic Activity: Self Care: Modalities: heat pack to lumbar spine with supine ther ex.    PATIENT EDUCATION:  Education details:  Updated and reviewed HEP Person educated: Patient Education method: Explanation, Demonstration, Tactile cues, Verbal cues, and Handouts Education comprehension: verbalized understanding, returned demonstration, verbal cues required, tactile cues required, and needs further education  HOME EXERCISE PROGRAM: Access Code: [email or web address]   ASSESSMENT:  CLINICAL IMPRESSION: 02/08/2023  Pt with continued soreness in low back. Increased pain with trial for fwd lumbar flexion in seated and in standing today. She is able to perform exercises today without increased pain. Max cuing required to perform exercises with optimal form. Pt to benefit from continued care. Will refer back to MD in 1-2 weeks if pain no bette.r   Eval: Patient presents with primary complaint of increased pain in low back, following recent surgery for compression fracture. She has increased muscle tension and tenderness in bil low thoracic and lumbar region. She has increased stiffness in t and l spine, with decreased ROM, and poor movement mechanics. Pt with decreased ability for full functional activities, and will benefit from skilled PT  to improve deficits and pain.   OBJECTIVE IMPAIRMENTS: decreased activity tolerance, decreased mobility, difficulty walking, decreased ROM, decreased strength, hypomobility, impaired perceived functional ability, increased muscle spasms, impaired flexibility, improper body mechanics, and pain.   ACTIVITY LIMITATIONS: carrying, lifting, bending, sitting, standing, squatting, transfers, and locomotion level  PARTICIPATION LIMITATIONS: meal prep, cleaning, laundry, driving, shopping, and community activity  PERSONAL FACTORS:  none  are also affecting patient's functional outcome.   REHAB POTENTIAL: Good  CLINICAL DECISION MAKING: Stable/uncomplicated  EVALUATION COMPLEXITY: Low   GOALS: Goals reviewed with patient? Yes  SHORT TERM GOALS: Target date: 01/19/2023  Pt to be independent with  initial HEP  Goal status: INITIAL  2.  Pt to report comfortable seated positioning at home in chair or couch.   Goal status: INITIAL   LONG TERM GOALS: Target date: 03/02/2023    Pt to be independent with final HEP  Goal status: INITIAL  2.  Pt to report decreased pain in back to 0-3/10 with activity and IADLs.   Goal status: INITIAL  3.  Pt to demo improved postural awareness for standing and sitting, for decreased pressure and pain.   Goal status: INITIAL  4.  Pt to demo optimal bend, lift, squat mechanics to improve pain and ability for IADLs.    Goal status: INITIAL   PLAN:  PT FREQUENCY: 1-2x/week  PT DURATION: 8 weeks  PLANNED INTERVENTIONS: Therapeutic exercises, Therapeutic activity, Neuromuscular re-education, Balance training, Gait training, Patient/Family education, Self Care, Joint mobilization, Joint manipulation, Orthotic/Fit training, DME instructions, Aquatic Therapy, Dry Needling, Electrical stimulation, Spinal manipulation, Spinal mobilization, Cryotherapy, Moist heat, Taping, Traction, Ultrasound, Ionotophoresis 4mg /ml Dexamethasone, and Manual therapy.  PLAN FOR NEXT SESSION: light flexion/lumbar/hip mobility, decompressive positioning, review HEP   Sedalia Muta, PT, DPT 12:12 PM  02/08/23

## 2023-02-09 DIAGNOSIS — Z8781 Personal history of (healed) traumatic fracture: Secondary | ICD-10-CM | POA: Diagnosis not present

## 2023-02-09 DIAGNOSIS — M810 Age-related osteoporosis without current pathological fracture: Secondary | ICD-10-CM | POA: Diagnosis not present

## 2023-02-09 DIAGNOSIS — M545 Low back pain, unspecified: Secondary | ICD-10-CM | POA: Diagnosis not present

## 2023-02-09 NOTE — Telephone Encounter (Signed)
Pt informed, [facility] scheduled for 02/14/2023.

## 2023-02-10 ENCOUNTER — Encounter: Payer: Self-pay | Admitting: Family Medicine

## 2023-02-11 ENCOUNTER — Encounter: Payer: Self-pay | Admitting: Family Medicine

## 2023-02-12 DIAGNOSIS — H6123 Impacted cerumen, bilateral: Secondary | ICD-10-CM | POA: Diagnosis not present

## 2023-02-12 NOTE — Telephone Encounter (Signed)
Forwarding to Dr. Corey to advise.  

## 2023-02-12 NOTE — Telephone Encounter (Signed)
Spoke with patient and went over insurance coverage. She is agreeable to starting on [facility] appt has been scheduled.

## 2023-02-13 ENCOUNTER — Ambulatory Visit (INDEPENDENT_AMBULATORY_CARE_PROVIDER_SITE_OTHER): Payer: Medicare Other | Admitting: Physical Therapy

## 2023-02-13 ENCOUNTER — Encounter: Payer: Self-pay | Admitting: Physical Therapy

## 2023-02-13 DIAGNOSIS — M5459 Other low back pain: Secondary | ICD-10-CM | POA: Diagnosis not present

## 2023-02-13 NOTE — Telephone Encounter (Signed)
Informed pt that per previous conversations, Dr. Denyse Amass has cleared this with her cardiologist and I would not expect to find record of this documentation in her medical record.

## 2023-02-13 NOTE — Therapy (Signed)
OUTPATIENT PHYSICAL THERAPY THORACOLUMBAR TREATMENT/PN   Patient Name: LENAE WHERLEY MRN: [id number] DOB:[birth year], 87 y.o., female Today's Date: 02/13/2023  Physical Therapy Progress Note  Dates of Reporting Period: 01/05/23  to  02/13/23    END OF SESSION:  PT End of Session - 02/13/23 1331     Visit Number 10    Number of Visits 16    Date for PT Re-Evaluation 03/02/23    Authorization Type Medicare PN done at visit 10.    PT Start Time 1305    PT Stop Time 1345    PT Time Calculation (min) 40 min    Activity Tolerance Patient tolerated treatment well    Behavior During Therapy WFL for tasks assessed/performed                 Past Medical History:  Diagnosis Date   Aortic insufficiency    a. mild by echo 2016. // Echo 06/2022: EF 50-55, no RWMA, septal movement consistent with LBBB, GR 1 DD, normal RVSF, small pericardial effusion, mild MR, mild AI, AV sclerosis without stenosis, RA pressure 3   Chest pain    Nuclear March, 2011, normal, ejection fraction 64%   Coronary artery disease involving native coronary artery of native heart without angina pectoris 07/11/2022   CCTA 9/23: CAC score 158 (49th percentile); non-obstructive CAD (mLAD 25-49, pOM1 25-49, pRCA 1-24, PLV 1-24); small pericardial effusion   Dyslipidemia    Elevated blood pressure reading    GERD (gastroesophageal reflux disease)    Patient sometimes has slight right lower jaw discomfort at the time of her reflux   LBBB (left bundle branch block)    Pericardial effusion    Echo December, 2009, small, incidental   Shoulder pain    Left scapular pain, appeared to be radicular   Past Surgical History:  Procedure Laterality Date   25 GAUGE PARS PLANA VITRECTOMY WITH 20 GAUGE MVR PORT FOR MACULAR HOLE  2000   BREAST BIOPSY  1983   benign   CHOLECYSTECTOMY  1984   IR KYPHO LUMBAR INC FX REDUCE BONE BX UNI/BIL CANNULATION INC/IMAGING  12/21/2022   IR RADIOLOGIST EVAL & MGMT  12/19/2022   IR  RADIOLOGIST EVAL & MGMT  01/04/2023   MELANOMA EXCISION  2012, 2013   NASAL SEPTUM SURGERY  1993   ROTATOR CUFF REPAIR  2002   Patient Active Problem List   Diagnosis Date Noted   Compression fracture of L1 lumbar vertebra 01/30/2023   Chronic bilateral low back pain without sciatica 01/30/2023   Osteoporosis 01/02/2023   Coronary artery disease involving native coronary artery of native heart without angina pectoris 07/11/2022   Aortic atherosclerosis 07/11/2022   Dyslipidemia    Blood pressure alteration    GERD (gastroesophageal reflux disease)    Aortic insufficiency    Shoulder pain    Precordial chest pain    Left bundle branch block 01/03/2010    PCP: Hillard Danker  REFERRING PROVIDER: [name]   REFERRING DIAG: [name]  Rationale for Evaluation and Treatment: Rehabilitation  THERAPY DIAG:  Other low back pain  ONSET DATE:   SUBJECTIVE:  SUBJECTIVE STATEMENT: 02/13/2023   Pt states doing well. She has has a few days of less pain. Back is sore now, after riding in car for 15 min to come to PT>   Eval: 12/21/22 pt had kyphoplasty for L1 fracture. States no fall, etc, does have Osteoporosis. Reports No other breaks.  States Pain in bil low back L>R.  No pain meds helping/tylenol etc.   Better: heat, constant pain,  Did go to [facility], classes, no aquatic, is walking around house some for exercise.    PERTINENT HISTORY:  osteoporosis,   PAIN:  Are you having pain? Yes: NPRS scale: up to 6/10 Pain location: bil low lumbar Pain description: achey Aggravating factors: sitting, standing, unable to state Relieving factors: none stated   PRECAUTIONS: Other: Osteoporosis  WEIGHT BEARING RESTRICTIONS: No  FALLS:  Has patient fallen in last 6 months? No  PLOF: Independent  PATIENT  GOALS: Decreased pain in back  NEXT MD VISIT:   OBJECTIVE:   DIAGNOSTIC FINDINGS:   PATIENT SURVEYS:   COGNITION: Overall cognitive status: Within functional limits for tasks assessed     SENSATION:   POSTURE: sway back posture   PALPATION: Minimal pain to palpate today. Tightness in bil mid and low thoracic paraspinals , lumbar and QL.   LUMBAR ROM:   AROM eval  Flexion Mild limitation, pain at end range.   Extension Mod/significant limitation  Right lateral flexion Mod limitation  Left lateral flexion Mod limitation  Right rotation   Left rotation    (Blank rows = not tested)  LOWER EXTREMITY ROM:    Hips: mild limitation for ER and IR bil,  Knees: WFL  LOWER EXTREMITY MMT:    MMT Right eval Left eval  Hip flexion 5 4+  Hip extension    Hip abduction 4 4+  Hip adduction    Hip internal rotation    Hip external rotation    Knee flexion 5 5  Knee extension 5 5  Ankle dorsiflexion    Ankle plantarflexion    Ankle inversion    Ankle eversion     (Blank rows = not tested)  LUMBAR SPECIAL TESTS:   GAIT:   TODAY'S TREATMENT:                                                                                                                              DATE:   02/13/23: Therapeutic Exercise: Aerobic: Supine:   supine to sit transfer x 3 with education on TA contraction and mechanics/log roll. ; SLR  x 10 bil;  Seated:  pelvic tilts x 10,  Fwd hip hinge motion x 10;  Sit to stand x 10;  Squats with education on mechanics x 15;  S/L : hip abd x 10 bil  Standing:  Stretches: LTR x 15;  Neuromuscular Re-education: Manual Therapy:  Long leg distraction x 2 min on L:    02/06/23: Therapeutic Exercise: Aerobic: Supine:  pelvic tilts x 15;   SLR 2 x 10 bil;   supine march with TA   2 x 10 with GTB ;  bridging x 10   Seated:   Standing:  Stretches:  LTR x 15;  DKTC 30 sec x 2;  Neuromuscular Re-education: Manual Therapy:  Long leg distraction x 2 min  bil; STM/TPR to L lumbar paraspinals, light PA mobs low thoracic and lumbar; ( pililow under hips) ;  Trigger Point Dry-Needling  Treatment instructions: Expect mild to moderate muscle soreness. S/S of pneumothorax if dry needled over a lung field, and to seek immediate medical attention should they occur. Patient verbalized understanding of these instructions and education.  Patient Consent Given: Yes Education handout provided: Yes Muscles treated: lumbar multifidi L , L1-3  Electrical stimulation performed: No Parameters: N/A Treatment response/outcome: palpable increase in muscle length.    02/01/23: Therapeutic Exercise: Aerobic: Supine:    pelvic tilts x 15;    SLR 2 x 10 bil;   supine march with TA   2 x 10;  bridging x 10 (difficult, mild pain at start position);  S/L hip abd x 10 bil; education and practice for supine to sit log roll/transfer x 3;  Seated:  cuing for achieving upright seated posture with neutral spine Standing:  Stretches:  LTR x 15;  Neuromuscular Re-education: Manual Therapy:  Long leg distraction x 2 min bil; STM/TPR to L lumbar paraspinals, light PA mobs low thoracic and lumbar; ( pililow under hips) ; S/L decompression stretch with manual assist x 10;  Therapeutic Activity: Self Care: Modalities: heat pack to lumbar spine with supine ther ex.     PATIENT EDUCATION:  Education details: Updated and reviewed HEP Person educated: Patient Education method: Explanation, Demonstration, Tactile cues, Verbal cues, and Handouts Education comprehension: verbalized understanding, returned demonstration, verbal cues required, tactile cues required, and needs further education  HOME EXERCISE PROGRAM: Access Code: [email or web address]   ASSESSMENT:  CLINICAL IMPRESSION: 02/13/2023  Pt continues to require max cues for performing exercises with optimal form. Reviewed TA contraction today for sit to supine transfers to decrease pain. Reviewed optimal seated posture for  decreasing pain. Reviewed bend/squat mechanics for optimal form. Discussed importance of performing exercises and HEP, as well as continuing general exercise and walking as tolerated. She has shown progress in lumbar/pelvic motion, seen with pelvic tilt and ability for more upright seated posture today. She also has had much improved pain levels overall. She has had several days of low pain levels for the last 2 weeks which is an improvement. Pt to benefit from continued care.   Eval: Patient presents with primary complaint of increased pain in low back, following recent surgery for compression fracture. She has increased muscle tension and tenderness in bil low thoracic and lumbar region. She has increased stiffness in t and l spine, with decreased ROM, and poor movement mechanics. Pt with decreased ability for full functional activities, and will benefit from skilled PT to improve deficits and pain.   OBJECTIVE IMPAIRMENTS: decreased activity tolerance, decreased mobility, difficulty walking, decreased ROM, decreased strength, hypomobility, impaired perceived functional ability, increased muscle spasms, impaired flexibility, improper body mechanics, and pain.   ACTIVITY LIMITATIONS: carrying, lifting, bending, sitting, standing, squatting, transfers, and locomotion level  PARTICIPATION LIMITATIONS: meal prep, cleaning, laundry, driving, shopping, and community activity  PERSONAL FACTORS:  none  are also affecting patient's functional outcome.   REHAB POTENTIAL: Good  CLINICAL DECISION MAKING: Stable/uncomplicated  EVALUATION COMPLEXITY: Low   GOALS:  Goals reviewed with patient? Yes  SHORT TERM GOALS: Target date: 01/19/2023  Pt to be independent with initial HEP  Goal status: MET  2.  Pt to report comfortable seated positioning at home in chair or couch.   Goal status: IN PROGRESS   LONG TERM GOALS: Target date: 03/02/2023    Pt to be independent with final HEP  Goal status: IN  PROGRESS  2.  Pt to report decreased pain in back to 0-3/10 with activity and IADLs.   Goal status: IN PROGRESS  3.  Pt to demo improved postural awareness for standing and sitting, for decreased pressure and pain.   Goal status: INITIAL  4.  Pt to demo optimal bend, lift, squat mechanics to improve pain and ability for IADLs.    Goal status: MET   PLAN:  PT FREQUENCY: 1-2x/week  PT DURATION: 8 weeks  PLANNED INTERVENTIONS: Therapeutic exercises, Therapeutic activity, Neuromuscular re-education, Balance training, Gait training, Patient/Family education, Self Care, Joint mobilization, Joint manipulation, Orthotic/Fit training, DME instructions, Aquatic Therapy, Dry Needling, Electrical stimulation, Spinal manipulation, Spinal mobilization, Cryotherapy, Moist heat, Taping, Traction, Ultrasound, Ionotophoresis /ml Dexamethasone, and Manual therapy.  PLAN FOR NEXT SESSION: light flexion/lumbar/hip mobility, decompressive positioning, review HEP   Sedalia Muta, PT, DPT 1:55 PM  02/13/23

## 2023-02-14 ENCOUNTER — Ambulatory Visit: Payer: Medicare Other

## 2023-02-14 NOTE — Telephone Encounter (Addendum)
[facility] Injection Schedule Inj #1 - 02/21/23 Inj #2 - 03/22/24 Inj #3 - 04/23/23 Inj #4 - 05/25/23 Inj #5 - 06/25/23 Inj #6 - scheduled 07/27/23 Inj #7  Inj #8  Inj #9  Inj #10  Inj #11  Inj #12

## 2023-02-15 ENCOUNTER — Encounter: Payer: Self-pay | Admitting: Physical Therapy

## 2023-02-15 ENCOUNTER — Ambulatory Visit (INDEPENDENT_AMBULATORY_CARE_PROVIDER_SITE_OTHER): Payer: Medicare Other | Admitting: Physical Therapy

## 2023-02-15 DIAGNOSIS — M5459 Other low back pain: Secondary | ICD-10-CM

## 2023-02-15 NOTE — Therapy (Signed)
OUTPATIENT PHYSICAL THERAPY THORACOLUMBAR TREATMENT   Patient Name: Julia [name] MRN: [id number] DOB:08-23-1936, 87 y.o., female Today's Date: 02/15/2023   END OF SESSION:  PT End of Session - 02/15/23 1159     Visit Number 11    Number of Visits 16    Date for PT Re-Evaluation 03/02/23    Authorization Type Medicare PN done at visit 10.    PT Start Time 1205    PT Stop Time 1245    PT Time Calculation (min) 40 min    Activity Tolerance Patient tolerated treatment well    Behavior During Therapy WFL for tasks assessed/performed                 Past Medical History:  Diagnosis Date   Aortic insufficiency    a. mild by echo 2016. // Echo 06/2022: EF 50-55, no RWMA, septal movement consistent with LBBB, GR 1 DD, normal RVSF, small pericardial effusion, mild MR, mild AI, AV sclerosis without stenosis, RA pressure 3   Chest pain    Nuclear March, 2011, normal, ejection fraction 64%   Coronary artery disease involving native coronary artery of native heart without angina pectoris 07/11/2022   CCTA 9/23: CAC score 158 (49th percentile); non-obstructive CAD (mLAD 25-49, pOM1 25-49, pRCA 1-24, PLV 1-24); small pericardial effusion   Dyslipidemia    Elevated blood pressure reading    GERD (gastroesophageal reflux disease)    Patient sometimes has slight right lower jaw discomfort at the time of her reflux   LBBB (left bundle branch block)    Pericardial effusion    Echo December, 2009, small, incidental   Shoulder pain    Left scapular pain, appeared to be radicular   Past Surgical History:  Procedure Laterality Date   25 GAUGE PARS PLANA VITRECTOMY WITH 20 GAUGE MVR PORT FOR MACULAR HOLE  2000   BREAST BIOPSY  1983   benign   CHOLECYSTECTOMY  1984   IR KYPHO LUMBAR INC FX REDUCE BONE BX UNI/BIL CANNULATION INC/IMAGING  12/21/2022   IR RADIOLOGIST EVAL & MGMT  12/19/2022   IR RADIOLOGIST EVAL & MGMT  01/04/2023   MELANOMA EXCISION  2012, 2013   NASAL SEPTUM SURGERY  1993    ROTATOR CUFF REPAIR  2002   Patient Active Problem List   Diagnosis Date Noted   Compression fracture of L1 lumbar vertebra 01/30/2023   Chronic bilateral low back pain without sciatica 01/30/2023   Osteoporosis 01/02/2023   Coronary artery disease involving native coronary artery of native heart without angina pectoris 07/11/2022   Aortic atherosclerosis 07/11/2022   Dyslipidemia    Blood pressure alteration    GERD (gastroesophageal reflux disease)    Aortic insufficiency    Shoulder pain    Precordial chest pain    Left bundle branch block 01/03/2010    PCP: Hillard Danker  REFERRING PROVIDER: [name]   REFERRING DIAG: [name]  Rationale for Evaluation and Treatment: Rehabilitation  THERAPY DIAG:  Other low back pain  ONSET DATE:   SUBJECTIVE:  SUBJECTIVE STATEMENT: 02/15/2023   Pt states doing well. Most pain 4-5/10 only at times. Pain today 2-3 overall.   Eval: 12/21/22 pt had kyphoplasty for L1 fracture. States no fall, etc, does have Osteoporosis. Reports No other breaks.  States Pain in bil low back L>R.  No pain meds helping/tylenol etc.   Better: heat, constant pain,  Did go to [facility], classes, no aquatic, is walking around house some for exercise.    PERTINENT HISTORY:  osteoporosis,   PAIN:  Are you having pain? Yes: NPRS scale: up to 4-5/10 Pain location: bil low lumbar Pain description: achey Aggravating factors: sitting, standing, unable to state Relieving factors: none stated   PRECAUTIONS: Other: Osteoporosis  WEIGHT BEARING RESTRICTIONS: No  FALLS:  Has patient fallen in last 6 months? No  PLOF: Independent  PATIENT GOALS: Decreased pain in back  NEXT MD VISIT:   OBJECTIVE:   DIAGNOSTIC FINDINGS:   PATIENT SURVEYS:   COGNITION: Overall cognitive  status: Within functional limits for tasks assessed     SENSATION:   POSTURE: sway back posture   PALPATION: Minimal pain to palpate today. Tightness in bil mid and low thoracic paraspinals , lumbar and QL.   LUMBAR ROM:   AROM 02/13/23  Flexion Mild limitation, pain at end range.   Extension Mod/significant limitation  Right lateral flexion Mod limitation  Left lateral flexion Mod limitation  Right rotation   Left rotation    (Blank rows = not tested)  LOWER EXTREMITY ROM:    Hips: mild limitation for ER and IR bil,  Knees: WFL  LOWER EXTREMITY MMT:    MMT Right eval Left eval  Hip flexion 5 4+  Hip extension    Hip abduction 4 4+  Hip adduction    Hip internal rotation    Hip external rotation    Knee flexion 5 5  Knee extension 5 5  Ankle dorsiflexion    Ankle plantarflexion    Ankle inversion    Ankle eversion     (Blank rows = not tested)  LUMBAR SPECIAL TESTS:   GAIT:   TODAY'S TREATMENT:                                                                                                                              DATE:   02/15/23: Therapeutic Exercise: Aerobic: Bike L1 x 8 min Supine:   SLR  x 10 bil; bridging 2 x 10;   Seated:  pelvic tilts x 10,  Fwd hip hinge motion x 10;  Sit to stand x 10 regular chair, no hands;  Squats at chair x 15;  Standing:   march no hands; Hip abd 2 x 10 ea;  Stretches: LTR x 15; seated fwd flexion x 5; seated thoracic rotation x 10;  Neuromuscular Re-education: Manual Therapy:   02/06/23: Therapeutic Exercise: Aerobic: Supine:    pelvic tilts x 15;   SLR 2 x 10 bil;   supine  march with TA   2 x 10 with GTB ;  bridging x 10   Seated:   Standing:  Stretches:  LTR x 15;  DKTC 30 sec x 2;  Neuromuscular Re-education: Manual Therapy:  Long leg distraction x 2 min bil; STM/TPR to L lumbar paraspinals, light PA mobs low thoracic and lumbar; ( pililow under hips) ;  Trigger Point Dry-Needling  Treatment instructions:  Expect mild to moderate muscle soreness. S/S of pneumothorax if dry needled over a lung field, and to seek immediate medical attention should they occur. Patient verbalized understanding of these instructions and education.  Patient Consent Given: Yes Education handout provided: Yes Muscles treated: lumbar multifidi L , L1-3  Electrical stimulation performed: No Parameters: N/A Treatment response/outcome: palpable increase in muscle length.    02/01/23: Therapeutic Exercise: Aerobic: Supine:    pelvic tilts x 15;    SLR 2 x 10 bil;   supine march with TA   2 x 10;  bridging x 10 (difficult, mild pain at start position);  S/L hip abd x 10 bil; education and practice for supine to sit log roll/transfer x 3;  Seated:  cuing for achieving upright seated posture with neutral spine Standing:  Stretches:  LTR x 15;  Neuromuscular Re-education: Manual Therapy:  Long leg distraction x 2 min bil; STM/TPR to L lumbar paraspinals, light PA mobs low thoracic and lumbar; ( pililow under hips) ; S/L decompression stretch with manual assist x 10;  Therapeutic Activity: Self Care: Modalities: heat pack to lumbar spine with supine ther ex.     PATIENT EDUCATION:  Education details: Updated and reviewed HEP Person educated: Patient Education method: Explanation, Demonstration, Tactile cues, Verbal cues, and Handouts Education comprehension: verbalized understanding, returned demonstration, verbal cues required, tactile cues required, and needs further education  HOME EXERCISE PROGRAM: Access Code: [email or web address]   ASSESSMENT:  CLINICAL IMPRESSION: 02/15/2023  Pt with improving activity level and pain level overall. She is doing well with improved general mobility. Still having low level soreness, with twinges of increased pain. Mostly with sit to/from supine transfer. She continues to require max cues for exercises and posture, but is able to hold upright posture much better in sitting and standing from  previous weeks.   Eval: Patient presents with primary complaint of increased pain in low back, following recent surgery for compression fracture. She has increased muscle tension and tenderness in bil low thoracic and lumbar region. She has increased stiffness in t and l spine, with decreased ROM, and poor movement mechanics. Pt with decreased ability for full functional activities, and will benefit from skilled PT to improve deficits and pain.   OBJECTIVE IMPAIRMENTS: decreased activity tolerance, decreased mobility, difficulty walking, decreased ROM, decreased strength, hypomobility, impaired perceived functional ability, increased muscle spasms, impaired flexibility, improper body mechanics, and pain.   ACTIVITY LIMITATIONS: carrying, lifting, bending, sitting, standing, squatting, transfers, and locomotion level  PARTICIPATION LIMITATIONS: meal prep, cleaning, laundry, driving, shopping, and community activity  PERSONAL FACTORS:  none  are also affecting patient's functional outcome.   REHAB POTENTIAL: Good  CLINICAL DECISION MAKING: Stable/uncomplicated  EVALUATION COMPLEXITY: Low   GOALS: Goals reviewed with patient? Yes  SHORT TERM GOALS: Target date: 01/19/2023  Pt to be independent with initial HEP  Goal status: MET  2.  Pt to report comfortable seated positioning at home in chair or couch.   Goal status: IN PROGRESS   LONG TERM GOALS: Target date: 03/02/2023    Pt to be independent with  final HEP  Goal status: IN PROGRESS  2.  Pt to report decreased pain in back to 0-3/10 with activity and IADLs.   Goal status: IN PROGRESS  3.  Pt to demo improved postural awareness for standing and sitting, for decreased pressure and pain.   Goal status: INITIAL  4.  Pt to demo optimal bend, lift, squat mechanics to improve pain and ability for IADLs.    Goal status: MET   PLAN:  PT FREQUENCY: 1-2x/week  PT DURATION: 8 weeks  PLANNED INTERVENTIONS: Therapeutic  exercises, Therapeutic activity, Neuromuscular re-education, Balance training, Gait training, Patient/Family education, Self Care, Joint mobilization, Joint manipulation, Orthotic/Fit training, DME instructions, Aquatic Therapy, Dry Needling, Electrical stimulation, Spinal manipulation, Spinal mobilization, Cryotherapy, Moist heat, Taping, Traction, Ultrasound, Ionotophoresis /ml Dexamethasone, and Manual therapy.  PLAN FOR NEXT SESSION: light flexion/lumbar/hip mobility, decompressive positioning, review HEP   Sedalia Muta, PT, DPT 12:00 PM  02/15/23

## 2023-02-17 ENCOUNTER — Encounter: Payer: Self-pay | Admitting: Interventional Cardiology

## 2023-02-19 ENCOUNTER — Ambulatory Visit (INDEPENDENT_AMBULATORY_CARE_PROVIDER_SITE_OTHER): Payer: Medicare Other | Admitting: Physical Therapy

## 2023-02-19 ENCOUNTER — Encounter: Payer: Self-pay | Admitting: Physical Therapy

## 2023-02-19 DIAGNOSIS — M5459 Other low back pain: Secondary | ICD-10-CM

## 2023-02-19 NOTE — Therapy (Signed)
OUTPATIENT PHYSICAL THERAPY THORACOLUMBAR TREATMENT   Patient Name: Julia [name] MRN: [id number] DOB:[birth year], 87 y.o., female Today's Date: 02/19/2023   END OF SESSION:  PT End of Session - 02/19/23 1212     Visit Number 12    Number of Visits 16    Date for PT Re-Evaluation 03/02/23    Authorization Type Medicare PN done at visit 10.    PT Start Time 1216    PT Stop Time 1259    PT Time Calculation (min) 43 min    Activity Tolerance Patient tolerated treatment well    Behavior During Therapy WFL for tasks assessed/performed                 Past Medical History:  Diagnosis Date   Aortic insufficiency    a. mild by echo 2016. // Echo 06/2022: EF 50-55, no RWMA, septal movement consistent with LBBB, GR 1 DD, normal RVSF, small pericardial effusion, mild MR, mild AI, AV sclerosis without stenosis, RA pressure 3   Chest pain    Nuclear March, 2011, normal, ejection fraction 64%   Coronary artery disease involving native coronary artery of native heart without angina pectoris 07/11/2022   CCTA 9/23: CAC score 158 (49th percentile); non-obstructive CAD (mLAD 25-49, pOM1 25-49, pRCA 1-24, PLV 1-24); small pericardial effusion   Dyslipidemia    Elevated blood pressure reading    GERD (gastroesophageal reflux disease)    Patient sometimes has slight right lower jaw discomfort at the time of her reflux   LBBB (left bundle branch block)    Pericardial effusion    Echo December, 2009, small, incidental   Shoulder pain    Left scapular pain, appeared to be radicular   Past Surgical History:  Procedure Laterality Date   25 GAUGE PARS PLANA VITRECTOMY WITH 20 GAUGE MVR PORT FOR MACULAR HOLE  2000   BREAST BIOPSY  1983   benign   CHOLECYSTECTOMY  1984   IR KYPHO LUMBAR INC FX REDUCE BONE BX UNI/BIL CANNULATION INC/IMAGING  12/21/2022   IR RADIOLOGIST EVAL & MGMT  12/19/2022   IR RADIOLOGIST EVAL & MGMT  01/04/2023   MELANOMA EXCISION  2012, 2013   NASAL SEPTUM SURGERY  1993    ROTATOR CUFF REPAIR  2002   Patient Active Problem List   Diagnosis Date Noted   Compression fracture of L1 lumbar vertebra 01/30/2023   Chronic bilateral low back pain without sciatica 01/30/2023   Osteoporosis 01/02/2023   Coronary artery disease involving native coronary artery of native heart without angina pectoris 07/11/2022   Aortic atherosclerosis 07/11/2022   Dyslipidemia    Blood pressure alteration    GERD (gastroesophageal reflux disease)    Aortic insufficiency    Shoulder pain    Precordial chest pain    Left bundle branch block 01/03/2010    PCP: Hillard Danker  REFERRING PROVIDER: [name]   REFERRING DIAG: [name]  Rationale for Evaluation and Treatment: Rehabilitation  THERAPY DIAG:  Other low back pain  ONSET DATE:   SUBJECTIVE:  SUBJECTIVE STATEMENT: 02/19/2023   Pt states doing well. Most pain 4-5/10 intermittent. Back sore today, but had a few good days over the weekend.   Eval: 12/21/22 pt had kyphoplasty for L1 fracture. States no fall, etc, does have Osteoporosis. Reports No other breaks.  States Pain in bil low back L>R.  No pain meds helping/tylenol etc.   Better: heat, constant pain,  Did go to [facility], classes, no aquatic, is walking around house some for exercise.    PERTINENT HISTORY:  osteoporosis,   PAIN:  Are you having pain? Yes: NPRS scale: up to 4-5/10 Pain location: bil low lumbar Pain description: achey Aggravating factors: sitting, standing, unable to state Relieving factors: none stated   PRECAUTIONS: Other: Osteoporosis  WEIGHT BEARING RESTRICTIONS: No  FALLS:  Has patient fallen in last 6 months? No  PLOF: Independent  PATIENT GOALS: Decreased pain in back  NEXT MD VISIT:   OBJECTIVE:   DIAGNOSTIC FINDINGS:   PATIENT SURVEYS:    COGNITION: Overall cognitive status: Within functional limits for tasks assessed     SENSATION:   POSTURE: sway back posture   PALPATION: Minimal pain to palpate today. Tightness in bil mid and low thoracic paraspinals , lumbar and QL.   LUMBAR ROM:   AROM 02/13/23  Flexion Mild limitation, pain at end range.   Extension Mod/significant limitation  Right lateral flexion Mod limitation  Left lateral flexion Mod limitation  Right rotation   Left rotation    (Blank rows = not tested)  LOWER EXTREMITY ROM:    Hips: mild limitation for ER and IR bil,  Knees: WFL  LOWER EXTREMITY MMT:    MMT Right eval Left eval  Hip flexion 5 4+  Hip extension    Hip abduction 4 4+  Hip adduction    Hip internal rotation    Hip external rotation    Knee flexion 5 5  Knee extension 5 5  Ankle dorsiflexion    Ankle plantarflexion    Ankle inversion    Ankle eversion     (Blank rows = not tested)  LUMBAR SPECIAL TESTS:   GAIT:   TODAY'S TREATMENT:                                                                                                                              DATE:   02/19/23: Therapeutic Exercise: Aerobic: Bike L1 x 8 min Supine:    bridging 2 x 10;   Seated: Fwd hip hinge motion x 10;  Sit to stand x 10 regular chair, no hands;  Standing:   step ups x 10 bil, 6 in, light 1 UE support.  Stretches:  Standing side bending x 3 bil; seated fwd flexion x 5; seated thoracic rotation x 10;  Neuromuscular Re-education: Manual Therapy:  DTM/STM to L lumbar paraspinals in S/L.   02/06/23: Therapeutic Exercise: Aerobic: Supine:    pelvic tilts x 15;   SLR 2 x 10  bil;   supine march with TA   2 x 10 with GTB ;  bridging x 10   Seated:   Standing:  Stretches:  LTR x 15;  DKTC 30 sec x 2;  Neuromuscular Re-education: Manual Therapy:  Long leg distraction x 2 min bil; STM/TPR to L lumbar paraspinals, light PA mobs low thoracic and lumbar; ( pililow under hips) ;  Trigger  Point Dry-Needling  Treatment instructions: Expect mild to moderate muscle soreness. S/S of pneumothorax if dry needled over a lung field, and to seek immediate medical attention should they occur. Patient verbalized understanding of these instructions and education.  Patient Consent Given: Yes Education handout provided: Yes Muscles treated: lumbar multifidi L , L1-3  Electrical stimulation performed: No Parameters: N/A Treatment response/outcome: palpable increase in muscle length.    02/01/23: Therapeutic Exercise: Aerobic: Supine:    pelvic tilts x 15;    SLR 2 x 10 bil;   supine march with TA   2 x 10;  bridging x 10 (difficult, mild pain at start position);  S/L hip abd x 10 bil; education and practice for supine to sit log roll/transfer x 3;  Seated:  cuing for achieving upright seated posture with neutral spine Standing:  Stretches:  LTR x 15;  Neuromuscular Re-education: Manual Therapy:  Long leg distraction x 2 min bil; STM/TPR to L lumbar paraspinals, light PA mobs low thoracic and lumbar; ( pililow under hips) ; S/L decompression stretch with manual assist x 10;  Therapeutic Activity: Self Care: Modalities: heat pack to lumbar spine with supine ther ex.     PATIENT EDUCATION:  Education details: Updated and reviewed HEP Person educated: Patient Education method: Explanation, Demonstration, Tactile cues, Verbal cues, and Handouts Education comprehension: verbalized understanding, returned demonstration, verbal cues required, tactile cues required, and needs further education  HOME EXERCISE PROGRAM: Access Code: [email or web address]   ASSESSMENT:  CLINICAL IMPRESSION: 02/19/2023  Pt reports having several good days. She still has pain up to 5/10 at times. She is doing well with activities in sessions and increasing her activity at home, has been able to walk some. She continues to be frustrated by remaining pain. She has increased pain noted with trials for fwd flexion ROM and  stretching. Less pain with hip hinge motions, continued education on seated posture and hip hinge for functional bending.   Eval: Patient presents with primary complaint of increased pain in low back, following recent surgery for compression fracture. She has increased muscle tension and tenderness in bil low thoracic and lumbar region. She has increased stiffness in t and l spine, with decreased ROM, and poor movement mechanics. Pt with decreased ability for full functional activities, and will benefit from skilled PT to improve deficits and pain.   OBJECTIVE IMPAIRMENTS: decreased activity tolerance, decreased mobility, difficulty walking, decreased ROM, decreased strength, hypomobility, impaired perceived functional ability, increased muscle spasms, impaired flexibility, improper body mechanics, and pain.   ACTIVITY LIMITATIONS: carrying, lifting, bending, sitting, standing, squatting, transfers, and locomotion level  PARTICIPATION LIMITATIONS: meal prep, cleaning, laundry, driving, shopping, and community activity  PERSONAL FACTORS:  none  are also affecting patient's functional outcome.   REHAB POTENTIAL: Good  CLINICAL DECISION MAKING: Stable/uncomplicated  EVALUATION COMPLEXITY: Low   GOALS: Goals reviewed with patient? Yes  SHORT TERM GOALS: Target date: 01/19/2023  Pt to be independent with initial HEP  Goal status: MET  2.  Pt to report comfortable seated positioning at home in chair or couch.   Goal status:  IN PROGRESS   LONG TERM GOALS: Target date: 03/02/2023    Pt to be independent with final HEP  Goal status: IN PROGRESS  2.  Pt to report decreased pain in back to 0-3/10 with activity and IADLs.   Goal status: IN PROGRESS  3.  Pt to demo improved postural awareness for standing and sitting, for decreased pressure and pain.   Goal status: INITIAL  4.  Pt to demo optimal bend, lift, squat mechanics to improve pain and ability for IADLs.    Goal status:  MET   PLAN:  PT FREQUENCY: 1-2x/week  PT DURATION: 8 weeks  PLANNED INTERVENTIONS: Therapeutic exercises, Therapeutic activity, Neuromuscular re-education, Balance training, Gait training, Patient/Family education, Self Care, Joint mobilization, Joint manipulation, Orthotic/Fit training, DME instructions, Aquatic Therapy, Dry Needling, Electrical stimulation, Spinal manipulation, Spinal mobilization, Cryotherapy, Moist heat, Taping, Traction, Ultrasound, Ionotophoresis /ml Dexamethasone, and Manual therapy.  PLAN FOR NEXT SESSION: light flexion/lumbar/hip mobility, decompressive positioning, review HEP   Sedalia Muta, PT, DPT 2:55 PM  02/19/23

## 2023-02-19 NOTE — Telephone Encounter (Signed)
Yes.  OK to use this medicine. I did communicate with Dr. Denyse Amass

## 2023-02-21 ENCOUNTER — Ambulatory Visit (INDEPENDENT_AMBULATORY_CARE_PROVIDER_SITE_OTHER): Payer: Medicare Other

## 2023-02-21 DIAGNOSIS — M8000XD Age-related osteoporosis with current pathological fracture, unspecified site, subsequent encounter for fracture with routine healing: Secondary | ICD-10-CM

## 2023-02-21 MED ORDER — ROMOSOZUMAB-AQQG 105 MG/1.17ML ~~LOC~~ SOSY
210.0000 mg | PREFILLED_SYRINGE | Freq: Once | SUBCUTANEOUS | Status: AC
  Administered 2023-02-21: 210 mg via SUBCUTANEOUS

## 2023-02-21 NOTE — Progress Notes (Signed)
Patient received evenity injection in bilateral upper arms Per Dr. Denyse Amass and tolerated injections well

## 2023-02-22 ENCOUNTER — Encounter: Payer: Self-pay | Admitting: Physical Therapy

## 2023-02-22 ENCOUNTER — Ambulatory Visit (INDEPENDENT_AMBULATORY_CARE_PROVIDER_SITE_OTHER): Payer: Medicare Other | Admitting: Physical Therapy

## 2023-02-22 DIAGNOSIS — M5459 Other low back pain: Secondary | ICD-10-CM

## 2023-02-22 NOTE — Therapy (Signed)
OUTPATIENT PHYSICAL THERAPY THORACOLUMBAR TREATMENT   Patient Name: Julia [name] MRN: [id number] DOB:27-Jul-1936, 87 y.o., female Today's Date: 02/22/2023   END OF SESSION:  PT End of Session - 02/22/23 1225     Visit Number 13    Number of Visits 16    Date for PT Re-Evaluation 03/02/23    Authorization Type Medicare PN done at visit 10.    PT Start Time 1216    PT Stop Time 1257    PT Time Calculation (min) 41 min    Activity Tolerance Patient tolerated treatment well    Behavior During Therapy WFL for tasks assessed/performed                  Past Medical History:  Diagnosis Date   Aortic insufficiency    a. mild by echo 2016. // Echo 06/2022: EF 50-55, no RWMA, septal movement consistent with LBBB, GR 1 DD, normal RVSF, small pericardial effusion, mild MR, mild AI, AV sclerosis without stenosis, RA pressure 3   Chest pain    Nuclear March, 2011, normal, ejection fraction 64%   Coronary artery disease involving native coronary artery of native heart without angina pectoris 07/11/2022   CCTA 9/23: CAC score 158 (49th percentile); non-obstructive CAD (mLAD 25-49, pOM1 25-49, pRCA 1-24, PLV 1-24); small pericardial effusion   Dyslipidemia    Elevated blood pressure reading    GERD (gastroesophageal reflux disease)    Patient sometimes has slight right lower jaw discomfort at the time of her reflux   LBBB (left bundle branch block)    Pericardial effusion    Echo December, 2009, small, incidental   Shoulder pain    Left scapular pain, appeared to be radicular   Past Surgical History:  Procedure Laterality Date   25 GAUGE PARS PLANA VITRECTOMY WITH 20 GAUGE MVR PORT FOR MACULAR HOLE  2000   BREAST BIOPSY  1983   benign   CHOLECYSTECTOMY  1984   IR KYPHO LUMBAR INC FX REDUCE BONE BX UNI/BIL CANNULATION INC/IMAGING  12/21/2022   IR RADIOLOGIST EVAL & MGMT  12/19/2022   IR RADIOLOGIST EVAL & MGMT  01/04/2023   MELANOMA EXCISION  2012, 2013   NASAL SEPTUM SURGERY   1993   ROTATOR CUFF REPAIR  2002   Patient Active Problem List   Diagnosis Date Noted   Compression fracture of L1 lumbar vertebra 01/30/2023   Chronic bilateral low back pain without sciatica 01/30/2023   Osteoporosis 01/02/2023   Coronary artery disease involving native coronary artery of native heart without angina pectoris 07/11/2022   Aortic atherosclerosis 07/11/2022   Dyslipidemia    Blood pressure alteration    GERD (gastroesophageal reflux disease)    Aortic insufficiency    Shoulder pain    Precordial chest pain    Left bundle branch block 01/03/2010    PCP: Hillard Danker  REFERRING PROVIDER: [name]   REFERRING DIAG: [name]  Rationale for Evaluation and Treatment: Rehabilitation  THERAPY DIAG:  Other low back pain  ONSET DATE:   SUBJECTIVE:  SUBJECTIVE STATEMENT: 02/22/2023   Pt states doing well. States no pain this am. A little pain in car coming her, but she was able to move leg around and get pain to go away.   Eval: 12/21/22 pt had kyphoplasty for L1 fracture. States no fall, etc, does have Osteoporosis. Reports No other breaks.  States Pain in bil low back L>R.  No pain meds helping/tylenol etc.   Better: heat, constant pain,  Did go to [facility], classes, no aquatic, is walking around house some for exercise.    PERTINENT HISTORY:  osteoporosis,   PAIN:  Are you having pain? Yes: NPRS scale: up to 4-5/10 Pain location: bil low lumbar Pain description: achey Aggravating factors: sitting, standing, unable to state Relieving factors: none stated   PRECAUTIONS: Other: Osteoporosis  WEIGHT BEARING RESTRICTIONS: No  FALLS:  Has patient fallen in last 6 months? No  PLOF: Independent  PATIENT GOALS: Decreased pain in back  NEXT MD VISIT:   OBJECTIVE:    DIAGNOSTIC FINDINGS:   PATIENT SURVEYS:   COGNITION: Overall cognitive status: Within functional limits for tasks assessed     SENSATION:   POSTURE: sway back posture   PALPATION: Minimal pain to palpate today. Tightness in bil mid and low thoracic paraspinals , lumbar and QL.   LUMBAR ROM:   AROM 02/13/23  Flexion Mild limitation, pain at end range.   Extension Mod/significant limitation  Right lateral flexion Mod limitation  Left lateral flexion Mod limitation  Right rotation   Left rotation    (Blank rows = not tested)  LOWER EXTREMITY ROM:    Hips: mild limitation for ER and IR bil,  Knees: WFL  LOWER EXTREMITY MMT:    MMT Right eval Left eval  Hip flexion 5 4+  Hip extension    Hip abduction 4 4+  Hip adduction    Hip internal rotation    Hip external rotation    Knee flexion 5 5  Knee extension 5 5  Ankle dorsiflexion    Ankle plantarflexion    Ankle inversion    Ankle eversion     (Blank rows = not tested)  LUMBAR SPECIAL TESTS:   GAIT:   TODAY'S TREATMENT:                                                                                                                              DATE:   02/22/23: Therapeutic Exercise: Aerobic: Bike L1 x 6 min ( pain in low back at 6 min)  Supine:    bridging 2 x 10;  pelvic tilts x 10;  Seated: Fwd hip hinge motion x 10;  Sit to stand x 10 regular chair, no hands, 8lb;  Standing:  Rows GTB x 15;  Stretches:  Standing side bending x 3 bil;  hip hinge x 15;  seated fwd flexion x 5; seated thoracic rotation x 10;  Seated HSS 30 sec x 2 bil;  Neuromuscular Re-education:  Manual Therapy:    02/19/23: Therapeutic Exercise: Aerobic: Bike L1 x 8 min Supine:    bridging 2 x 10;   Seated: Fwd hip hinge motion x 10;  Sit to stand x 10 regular chair, no hands;  Standing:   step ups x 10 bil, 6 in, light 1 UE support.  Stretches:  Standing side bending x 3 bil; seated fwd flexion x 5; seated thoracic rotation x 10;   Neuromuscular Re-education: Manual Therapy:  DTM/STM to L lumbar paraspinals in S/L.   02/06/23: Therapeutic Exercise: Aerobic: Supine:    pelvic tilts x 15;   SLR 2 x 10 bil;   supine march with TA   2 x 10 with GTB ;  bridging x 10   Seated:   Standing:  Stretches:  LTR x 15;  DKTC 30 sec x 2;  Neuromuscular Re-education: Manual Therapy:  Long leg distraction x 2 min bil; STM/TPR to L lumbar paraspinals, light PA mobs low thoracic and lumbar; ( pililow under hips) ;  Trigger Point Dry-Needling  Treatment instructions: Expect mild to moderate muscle soreness. S/S of pneumothorax if dry needled over a lung field, and to seek immediate medical attention should they occur. Patient verbalized understanding of these instructions and education.  Patient Consent Given: Yes Education handout provided: Yes Muscles treated: lumbar multifidi L , L1-3  Electrical stimulation performed: No Parameters: N/A Treatment response/outcome: palpable increase in muscle length.    02/01/23: Therapeutic Exercise: Aerobic: Supine:    pelvic tilts x 15;    SLR 2 x 10 bil;   supine march with TA   2 x 10;  bridging x 10 (difficult, mild pain at start position);  S/L hip abd x 10 bil; education and practice for supine to sit log roll/transfer x 3;  Seated:  cuing for achieving upright seated posture with neutral spine Standing:  Stretches:  LTR x 15;  Neuromuscular Re-education: Manual Therapy:  Long leg distraction x 2 min bil; STM/TPR to L lumbar paraspinals, light PA mobs low thoracic and lumbar; ( pililow under hips) ; S/L decompression stretch with manual assist x 10;  Therapeutic Activity: Self Care: Modalities: heat pack to lumbar spine with supine ther ex.     PATIENT EDUCATION:  Education details: Updated and reviewed HEP Person educated: Patient Education method: Explanation, Demonstration, Tactile cues, Verbal cues, and Handouts Education comprehension: verbalized understanding, returned  demonstration, verbal cues required, tactile cues required, and needs further education  HOME EXERCISE PROGRAM: Access Code: [email or web address]   ASSESSMENT:  CLINICAL IMPRESSION: 02/22/2023  Pt overall progressing well, with pain less often. She is also trying to change positions when she does feel pain, which is an improvement. She is able to do most regular activities including walking up to 30 min for exercise. She is still having bothersome pain at times, mostly each day, but improving. She will benefit from continued care for improving spine ROM, strength and pain relief.   Eval: Patient presents with primary complaint of increased pain in low back, following recent surgery for compression fracture. She has increased muscle tension and tenderness in bil low thoracic and lumbar region. She has increased stiffness in t and l spine, with decreased ROM, and poor movement mechanics. Pt with decreased ability for full functional activities, and will benefit from skilled PT to improve deficits and pain.   OBJECTIVE IMPAIRMENTS: decreased activity tolerance, decreased mobility, difficulty walking, decreased ROM, decreased strength, hypomobility, impaired perceived functional ability, increased muscle spasms, impaired flexibility, improper  body mechanics, and pain.   ACTIVITY LIMITATIONS: carrying, lifting, bending, sitting, standing, squatting, transfers, and locomotion level  PARTICIPATION LIMITATIONS: meal prep, cleaning, laundry, driving, shopping, and community activity  PERSONAL FACTORS:  none  are also affecting patient's functional outcome.   REHAB POTENTIAL: Good  CLINICAL DECISION MAKING: Stable/uncomplicated  EVALUATION COMPLEXITY: Low   GOALS: Goals reviewed with patient? Yes  SHORT TERM GOALS: Target date: 01/19/2023  Pt to be independent with initial HEP  Goal status: MET  2.  Pt to report comfortable seated positioning at home in chair or couch.   Goal status: IN  PROGRESS   LONG TERM GOALS: Target date: 03/02/2023    Pt to be independent with final HEP  Goal status: IN PROGRESS  2.  Pt to report decreased pain in back to 0-3/10 with activity and IADLs.   Goal status: IN PROGRESS  3.  Pt to demo improved postural awareness for standing and sitting, for decreased pressure and pain.   Goal status: INITIAL  4.  Pt to demo optimal bend, lift, squat mechanics to improve pain and ability for IADLs.    Goal status: MET   PLAN:  PT FREQUENCY: 1-2x/week  PT DURATION: 8 weeks  PLANNED INTERVENTIONS: Therapeutic exercises, Therapeutic activity, Neuromuscular re-education, Balance training, Gait training, Patient/Family education, Self Care, Joint mobilization, Joint manipulation, Orthotic/Fit training, DME instructions, Aquatic Therapy, Dry Needling, Electrical stimulation, Spinal manipulation, Spinal mobilization, Cryotherapy, Moist heat, Taping, Traction, Ultrasound, Ionotophoresis 4mg /ml Dexamethasone, and Manual therapy.  PLAN FOR NEXT SESSION: light flexion/lumbar/hip mobility, decompressive positioning, review HEP   Sedalia Muta, PT, DPT 3:02 PM  02/22/23

## 2023-02-28 ENCOUNTER — Encounter: Payer: Self-pay | Admitting: Physical Therapy

## 2023-02-28 ENCOUNTER — Ambulatory Visit (INDEPENDENT_AMBULATORY_CARE_PROVIDER_SITE_OTHER): Payer: Medicare Other | Admitting: Physical Therapy

## 2023-02-28 DIAGNOSIS — M5459 Other low back pain: Secondary | ICD-10-CM

## 2023-02-28 NOTE — Therapy (Signed)
OUTPATIENT PHYSICAL THERAPY THORACOLUMBAR TREATMENT   Patient Name: Julia [name] MRN: [id number] DOB:1936-06-16, 87 y.o., female Today's Date: 02/27/2023   END OF SESSION:  PT End of Session - 02/28/23 1310     Visit Number 14    Number of Visits 16    Date for PT Re-Evaluation 03/02/23    Authorization Type Medicare PN done at visit 10.    PT Start Time 1309    PT Stop Time 1347    PT Time Calculation (min) 38 min    Activity Tolerance Patient tolerated treatment well    Behavior During Therapy WFL for tasks assessed/performed                  Past Medical History:  Diagnosis Date   Aortic insufficiency    a. mild by echo 2016. // Echo 06/2022: EF 50-55, no RWMA, septal movement consistent with LBBB, GR 1 DD, normal RVSF, small pericardial effusion, mild MR, mild AI, AV sclerosis without stenosis, RA pressure 3   Chest pain    Nuclear March, 2011, normal, ejection fraction 64%   Coronary artery disease involving native coronary artery of native heart without angina pectoris 07/11/2022   CCTA 9/23: CAC score 158 (49th percentile); non-obstructive CAD (mLAD 25-49, pOM1 25-49, pRCA 1-24, PLV 1-24); small pericardial effusion   Dyslipidemia    Elevated blood pressure reading    GERD (gastroesophageal reflux disease)    Patient sometimes has slight right lower jaw discomfort at the time of her reflux   LBBB (left bundle branch block)    Pericardial effusion    Echo December, 2009, small, incidental   Shoulder pain    Left scapular pain, appeared to be radicular   Past Surgical History:  Procedure Laterality Date   25 GAUGE PARS PLANA VITRECTOMY WITH 20 GAUGE MVR PORT FOR MACULAR HOLE  2000   BREAST BIOPSY  1983   benign   CHOLECYSTECTOMY  1984   IR KYPHO LUMBAR INC FX REDUCE BONE BX UNI/BIL CANNULATION INC/IMAGING  12/21/2022   IR RADIOLOGIST EVAL & MGMT  12/19/2022   IR RADIOLOGIST EVAL & MGMT  01/04/2023   MELANOMA EXCISION  2012, 2013   NASAL SEPTUM SURGERY   1993   ROTATOR CUFF REPAIR  2002   Patient Active Problem List   Diagnosis Date Noted   Compression fracture of L1 lumbar vertebra (HCC) 01/30/2023   Chronic bilateral low back pain without sciatica 01/30/2023   Osteoporosis 01/02/2023   Coronary artery disease involving native coronary artery of native heart without angina pectoris 07/11/2022   Aortic atherosclerosis (HCC) 07/11/2022   Dyslipidemia    Blood pressure alteration    GERD (gastroesophageal reflux disease)    Aortic insufficiency    Shoulder pain    Precordial chest pain    Left bundle branch block 01/03/2010    PCP: Hillard Danker  REFERRING PROVIDER: [name]   REFERRING DIAG: [name]  Rationale for Evaluation and Treatment: Rehabilitation  THERAPY DIAG:  Other low back pain  ONSET DATE:   SUBJECTIVE:  SUBJECTIVE STATEMENT: 02/28/2023   Pt states several good days of little/no pain. She also has been able to turn over better without pain.  Today she has pain, and is very concerned as to why it is hurting.   Eval: 12/21/22 pt had kyphoplasty for L1 fracture. States no fall, etc, does have Osteoporosis. Reports No other breaks.  States Pain in bil low back L>R.  No pain meds helping/tylenol etc.   Better: heat, constant pain,  Did go to [facility], classes, no aquatic, is walking around house some for exercise.    PERTINENT HISTORY:  osteoporosis,   PAIN:  Are you having pain? Yes: NPRS scale: up to 4-5/10 Pain location: bil low lumbar Pain description: achey Aggravating factors: sitting, standing, unable to state Relieving factors: none stated   PRECAUTIONS: Other: Osteoporosis  WEIGHT BEARING RESTRICTIONS: No  FALLS:  Has patient fallen in last 6 months? No  PLOF: Independent  PATIENT GOALS: Decreased pain in  back  NEXT MD VISIT:   OBJECTIVE:   DIAGNOSTIC FINDINGS:   PATIENT SURVEYS:   COGNITION: Overall cognitive status: Within functional limits for tasks assessed     SENSATION:   POSTURE: sway back posture   PALPATION: Minimal pain to palpate today. Tightness in bil mid and low thoracic paraspinals , lumbar and QL.   LUMBAR ROM:   AROM 02/13/23  Flexion Mild limitation, pain at end range.   Extension Mod/significant limitation  Right lateral flexion Mod limitation  Left lateral flexion Mod limitation  Right rotation   Left rotation    (Blank rows = not tested)  LOWER EXTREMITY ROM:    Hips: mild limitation for ER and IR bil,  Knees: WFL  LOWER EXTREMITY MMT:    MMT Right eval Left eval  Hip flexion 5 4+  Hip extension    Hip abduction 4 4+  Hip adduction    Hip internal rotation    Hip external rotation    Knee flexion 5 5  Knee extension 5 5  Ankle dorsiflexion    Ankle plantarflexion    Ankle inversion    Ankle eversion     (Blank rows = not tested)  LUMBAR SPECIAL TESTS:   GAIT:   TODAY'S TREATMENT:                                                                                                                              DATE:   02/27/23: Therapeutic Exercise: Aerobic: Supine:    SLR x 10 bil with TA; supine march with TA x 15;   Prone: hip ext 2 x 10;  Seated: Fwd hip hinge motion x 10;   Standing:  Stretches:    LTR x 15;  DKTC 30 sec x 3; pelvic tilts x 20;  Neuromuscular Re-education: Manual Therapy:  STM/tpr to L lumbar paraspinals,  Modalities: moist hot pack to low back during supine ther ex.   Previous: Therapeutic Exercise: Aerobic: Bike L1 x  8 min Supine:    bridging 2 x 10;   Seated: Fwd hip hinge motion x 10;  Sit to stand x 10 regular chair, no hands- 8 lb; Standing:   step ups x 10 bil, 6 in, light 1 UE support.  Stretches:  Standing side bending x 3 bil; seated fwd flexion x 5; seated thoracic rotation x 10;  Neuromuscular  Re-education: Manual Therapy:  DTM/STM to L lumbar paraspinals in S/L.   02/06/23: Therapeutic Exercise: Aerobic: Supine:    pelvic tilts x 15;   SLR 2 x 10 bil;   supine march with TA   2 x 10 with GTB ;  bridging x 10   Seated:   Standing:  Stretches:  LTR x 15;  DKTC 30 sec x 2;  Neuromuscular Re-education: Manual Therapy:  Long leg distraction x 2 min bil; STM/TPR to L lumbar paraspinals, light PA mobs low thoracic and lumbar; ( pililow under hips) ;  Trigger Point Dry-Needling  Treatment instructions: Expect mild to moderate muscle soreness. S/S of pneumothorax if dry needled over a lung field, and to seek immediate medical attention should they occur. Patient verbalized understanding of these instructions and education.  Patient Consent Given: Yes Education handout provided: Yes Muscles treated: lumbar multifidi L , L1-3  Electrical stimulation performed: No Parameters: N/A Treatment response/outcome: palpable increase in muscle length.    02/01/23: Therapeutic Exercise: Aerobic: Supine:    pelvic tilts x 15;    SLR 2 x 10 bil;   supine march with TA   2 x 10;  bridging x 10 (difficult, mild pain at start position);  S/L hip abd x 10 bil; education and practice for supine to sit log roll/transfer x 3;  Seated:  cuing for achieving upright seated posture with neutral spine Standing:  Stretches:  LTR x 15;  Neuromuscular Re-education: Manual Therapy:  Long leg distraction x 2 min bil; STM/TPR to L lumbar paraspinals, light PA mobs low thoracic and lumbar; ( pililow under hips) ; S/L decompression stretch with manual assist x 10;  Therapeutic Activity: Self Care: Modalities: heat pack to lumbar spine with supine ther ex.     PATIENT EDUCATION:  Education details: Updated and reviewed HEP Person educated: Patient Education method: Explanation, Demonstration, Tactile cues, Verbal cues, and Handouts Education comprehension: verbalized understanding, returned demonstration,  verbal cues required, tactile cues required, and needs further education  HOME EXERCISE PROGRAM: Access Code: [email or web address]   ASSESSMENT:  CLINICAL IMPRESSION: 02/28/2023  Pt with more pain today. She did try heat and some of ther HEP at home for pain prior to session, which is a good improvement for her self management. She has decreased soreness after session today with walking. Discussed continuing to focus on posture and mobility exercises as able, and also discussed reality of having some days that are better than others following back surgery.   Eval: Patient presents with primary complaint of increased pain in low back, following recent surgery for compression fracture. She has increased muscle tension and tenderness in bil low thoracic and lumbar region. She has increased stiffness in t and l spine, with decreased ROM, and poor movement mechanics. Pt with decreased ability for full functional activities, and will benefit from skilled PT to improve deficits and pain.   OBJECTIVE IMPAIRMENTS: decreased activity tolerance, decreased mobility, difficulty walking, decreased ROM, decreased strength, hypomobility, impaired perceived functional ability, increased muscle spasms, impaired flexibility, improper body mechanics, and pain.   ACTIVITY LIMITATIONS: carrying, lifting, bending, sitting,  standing, squatting, transfers, and locomotion level  PARTICIPATION LIMITATIONS: meal prep, cleaning, laundry, driving, shopping, and community activity  PERSONAL FACTORS:  none  are also affecting patient's functional outcome.   REHAB POTENTIAL: Good  CLINICAL DECISION MAKING: Stable/uncomplicated  EVALUATION COMPLEXITY: Low   GOALS: Goals reviewed with patient? Yes  SHORT TERM GOALS: Target date: 01/19/2023  Pt to be independent with initial HEP  Goal status: MET  2.  Pt to report comfortable seated positioning at home in chair or couch.   Goal status: IN PROGRESS   LONG TERM GOALS: Target  date: 03/02/2023    Pt to be independent with final HEP  Goal status: IN PROGRESS  2.  Pt to report decreased pain in back to 0-3/10 with activity and IADLs.   Goal status: IN PROGRESS  3.  Pt to demo improved postural awareness for standing and sitting, for decreased pressure and pain.   Goal status: INITIAL  4.  Pt to demo optimal bend, lift, squat mechanics to improve pain and ability for IADLs.    Goal status: MET   PLAN:  PT FREQUENCY: 1-2x/week  PT DURATION: 8 weeks  PLANNED INTERVENTIONS: Therapeutic exercises, Therapeutic activity, Neuromuscular re-education, Balance training, Gait training, Patient/Family education, Self Care, Joint mobilization, Joint manipulation, Orthotic/Fit training, DME instructions, Aquatic Therapy, Dry Needling, Electrical stimulation, Spinal manipulation, Spinal mobilization, Cryotherapy, Moist heat, Taping, Traction, Ultrasound, Ionotophoresis 4mg /ml Dexamethasone, and Manual therapy.  PLAN FOR NEXT SESSION: light flexion/lumbar/hip mobility, decompressive positioning, review HEP   Sedalia Muta, PT, DPT 1:24 PM  02/28/23

## 2023-03-02 ENCOUNTER — Encounter: Payer: Self-pay | Admitting: Physical Therapy

## 2023-03-02 ENCOUNTER — Ambulatory Visit (INDEPENDENT_AMBULATORY_CARE_PROVIDER_SITE_OTHER): Payer: Medicare Other | Admitting: Physical Therapy

## 2023-03-02 DIAGNOSIS — M5459 Other low back pain: Secondary | ICD-10-CM | POA: Diagnosis not present

## 2023-03-02 NOTE — Therapy (Signed)
OUTPATIENT PHYSICAL THERAPY THORACOLUMBAR TREATMENT   Patient Name: Julia [name] MRN: [id number] DOB:05-Jan-1936, 87 y.o., female Today's Date: 03/02/2023   END OF SESSION:  PT End of Session - 03/02/23 1234     Visit Number 15    Number of Visits 16    Date for PT Re-Evaluation 03/02/23    Authorization Type Medicare PN done at visit 10.    PT Start Time 1235    PT Stop Time 1315    PT Time Calculation (min) 40 min    Activity Tolerance Patient tolerated treatment well    Behavior During Therapy WFL for tasks assessed/performed                  Past Medical History:  Diagnosis Date   Aortic insufficiency    a. mild by echo 2016. // Echo 06/2022: EF 50-55, no RWMA, septal movement consistent with LBBB, GR 1 DD, normal RVSF, small pericardial effusion, mild MR, mild AI, AV sclerosis without stenosis, RA pressure 3   Chest pain    Nuclear March, 2011, normal, ejection fraction 64%   Coronary artery disease involving native coronary artery of native heart without angina pectoris 07/11/2022   CCTA 9/23: CAC score 158 (49th percentile); non-obstructive CAD (mLAD 25-49, pOM1 25-49, pRCA 1-24, PLV 1-24); small pericardial effusion   Dyslipidemia    Elevated blood pressure reading    GERD (gastroesophageal reflux disease)    Patient sometimes has slight right lower jaw discomfort at the time of her reflux   LBBB (left bundle branch block)    Pericardial effusion    Echo December, 2009, small, incidental   Shoulder pain    Left scapular pain, appeared to be radicular   Past Surgical History:  Procedure Laterality Date   25 GAUGE PARS PLANA VITRECTOMY WITH 20 GAUGE MVR PORT FOR MACULAR HOLE  2000   BREAST BIOPSY  1983   benign   CHOLECYSTECTOMY  1984   IR KYPHO LUMBAR INC FX REDUCE BONE BX UNI/BIL CANNULATION INC/IMAGING  12/21/2022   IR RADIOLOGIST EVAL & MGMT  12/19/2022   IR RADIOLOGIST EVAL & MGMT  01/04/2023   MELANOMA EXCISION  2012, 2013   NASAL SEPTUM SURGERY   1993   ROTATOR CUFF REPAIR  2002   Patient Active Problem List   Diagnosis Date Noted   Compression fracture of L1 lumbar vertebra (HCC) 01/30/2023   Chronic bilateral low back pain without sciatica 01/30/2023   Osteoporosis 01/02/2023   Coronary artery disease involving native coronary artery of native heart without angina pectoris 07/11/2022   Aortic atherosclerosis (HCC) 07/11/2022   Dyslipidemia    Blood pressure alteration    GERD (gastroesophageal reflux disease)    Aortic insufficiency    Shoulder pain    Precordial chest pain    Left bundle branch block 01/03/2010    PCP: Hillard Danker  REFERRING PROVIDER: [name]   REFERRING DIAG: [name]  Rationale for Evaluation and Treatment: Rehabilitation  THERAPY DIAG:  Other low back pain  ONSET DATE:   SUBJECTIVE:  SUBJECTIVE STATEMENT: 03/02/2023   Pt states variable pain, slight soreness today. Also has soreness at times with longer sitting.   Eval: 12/21/22 pt had kyphoplasty for L1 fracture. States no fall, etc, does have Osteoporosis. Reports No other breaks.  States Pain in bil low back L>R.  No pain meds helping/tylenol etc.   Better: heat, constant pain,  Did go to [facility], classes, no aquatic, is walking around house some for exercise.    PERTINENT HISTORY:  osteoporosis,   PAIN:  Are you having pain? Yes: NPRS scale: up to 4-5/10 Pain location: bil low lumbar Pain description: achey Aggravating factors: sitting, standing, unable to state Relieving factors: none stated   PRECAUTIONS: Other: Osteoporosis  WEIGHT BEARING RESTRICTIONS: No  FALLS:  Has patient fallen in last 6 months? No  PLOF: Independent  PATIENT GOALS: Decreased pain in back  NEXT MD VISIT:   OBJECTIVE: updated 03/02/23  DIAGNOSTIC FINDINGS:    PATIENT SURVEYS:   COGNITION: Overall cognitive status: Within functional limits for tasks assessed     SENSATION:   POSTURE: sway back posture   PALPATION:   LUMBAR ROM:   AROM 02/13/23 03/02/23  Flexion Mild limitation, pain at end range.  Mild limitation  Extension Mod/significant limitation Mod limitation  Right lateral flexion Mod limitation Mild limitation  Left lateral flexion Mod limitation Mild limitation  Right rotation    Left rotation     (Blank rows = not tested)  LOWER EXTREMITY ROM:    Hips: mild limitation for ER and IR bil,  Knees: WFL  LOWER EXTREMITY MMT:    MMT Right eval Left eval  Hip flexion 5 4+  Hip extension    Hip abduction 4 4+  Hip adduction    Hip internal rotation    Hip external rotation    Knee flexion 5 5  Knee extension 5 5  Ankle dorsiflexion    Ankle plantarflexion    Ankle inversion    Ankle eversion     (Blank rows = not tested)  LUMBAR SPECIAL TESTS:   GAIT:   TODAY'S TREATMENT:                                                                                                                              DATE:   03/02/23: Therapeutic Exercise: Aerobic: Bike L1 x 8 min ; Supine:   floor transfer x 4 with education on optimal mechanics     Seated: sit to stand x 10;    Standing:  Standing posture: with UE flexion x 10;  up/down 5 steps with no hand rail, x 5;  Stretches:  seated pelvic tilts x 20; standing QL x 3 bil;   Neuromuscular Re-education: Manual Therapy:     02/27/23: Therapeutic Exercise: Aerobic: Supine:    SLR x 10 bil with TA; supine march with TA x 15;   Prone: hip ext 2 x 10;  Seated: Fwd hip hinge motion x 10;  Standing:  Stretches:    LTR x 15;  DKTC 30 sec x 3; pelvic tilts x 20;  Neuromuscular Re-education: Manual Therapy:  STM/tpr to L lumbar paraspinals,  Modalities: moist hot pack to low back during supine ther ex.   Previous: Therapeutic Exercise: Aerobic: Bike L1 x 8 min Supine:     bridging 2 x 10;   Seated: Fwd hip hinge motion x 10;  Sit to stand x 10 regular chair, no hands- 8 lb; Standing:   step ups x 10 bil, 6 in, light 1 UE support.  Stretches:  Standing side bending x 3 bil; seated fwd flexion x 5; seated thoracic rotation x 10;  Neuromuscular Re-education: Manual Therapy:  DTM/STM to L lumbar paraspinals in S/L.   02/06/23: Therapeutic Exercise: Aerobic: Supine:    pelvic tilts x 15;   SLR 2 x 10 bil;   supine march with TA   2 x 10 with GTB ;  bridging x 10   Seated:   Standing:  Stretches:  LTR x 15;  DKTC 30 sec x 2;  Neuromuscular Re-education: Manual Therapy:  Long leg distraction x 2 min bil; STM/TPR to L lumbar paraspinals, light PA mobs low thoracic and lumbar; ( pililow under hips) ;  Trigger Point Dry-Needling  Treatment instructions: Expect mild to moderate muscle soreness. S/S of pneumothorax if dry needled over a lung field, and to seek immediate medical attention should they occur. Patient verbalized understanding of these instructions and education.  Patient Consent Given: Yes Education handout provided: Yes Muscles treated: lumbar multifidi L , L1-3  Electrical stimulation performed: No Parameters: N/A Treatment response/outcome: palpable increase in muscle length    PATIENT EDUCATION:  Education details:  reviewed HEP Person educated: Patient Education method: Programmer, multimedia, Demonstration, Tactile cues, Verbal cues, and Handouts Education comprehension: verbalized understanding, returned demonstration, verbal cues required, tactile cues required, and needs further education  HOME EXERCISE PROGRAM: Access Code: [email or web address]   ASSESSMENT:  CLINICAL IMPRESSION: 03/02/2023  Pt with variable soreness overall. She is doing quite well at this time, compared to start of care. She is doing better with some self management which is a great improvement. She continues to be bothered with remaining intermittent soreness that she did not have prior  to recent back surgery and compression fracture. Pt to be seen for additional visit 1-2 for reviewing final HEP, and likely d/c to HEP at that time, after MD follow up. She has not continued to get further pain relief in last couple weeks.   Eval: Patient presents with primary complaint of increased pain in low back, following recent surgery for compression fracture. She has increased muscle tension and tenderness in bil low thoracic and lumbar region. She has increased stiffness in t and l spine, with decreased ROM, and poor movement mechanics. Pt with decreased ability for full functional activities, and will benefit from skilled PT to improve deficits and pain.   OBJECTIVE IMPAIRMENTS: decreased activity tolerance, decreased mobility, difficulty walking, decreased ROM, decreased strength, hypomobility, impaired perceived functional ability, increased muscle spasms, impaired flexibility, improper body mechanics, and pain.   ACTIVITY LIMITATIONS: carrying, lifting, bending, sitting, standing, squatting, transfers, and locomotion level  PARTICIPATION LIMITATIONS: meal prep, cleaning, laundry, driving, shopping, and community activity  PERSONAL FACTORS:  none  are also affecting patient's functional outcome.   REHAB POTENTIAL: Good  CLINICAL DECISION MAKING: Stable/uncomplicated  EVALUATION COMPLEXITY: Low   GOALS: Goals reviewed with patient? Yes  SHORT TERM GOALS: Target date: 01/19/2023  Pt  to be independent with initial HEP  Goal status: MET  2.  Pt to report comfortable seated positioning at home in chair or couch.   Goal status: MET   LONG TERM GOALS: Target date: 03/02/2023    Pt to be independent with final HEP  Goal status: IN PROGRESS  2.  Pt to report decreased pain in back to 0-3/10 with activity and IADLs.   Goal status: IN PROGRESS  3.  Pt to demo improved postural awareness for standing and sitting, for decreased pressure and pain.   Goal status: MET  4.   Pt to demo optimal bend, lift, squat mechanics to improve pain and ability for IADLs.    Goal status: MET   PLAN:  PT FREQUENCY: 1-2x/week  PT DURATION: 8 weeks  PLANNED INTERVENTIONS: Therapeutic exercises, Therapeutic activity, Neuromuscular re-education, Balance training, Gait training, Patient/Family education, Self Care, Joint mobilization, Joint manipulation, Orthotic/Fit training, DME instructions, Aquatic Therapy, Dry Needling, Electrical stimulation, Spinal manipulation, Spinal mobilization, Cryotherapy, Moist heat, Taping, Traction, Ultrasound, Ionotophoresis 4mg /ml Dexamethasone, and Manual therapy.  PLAN FOR NEXT SESSION: light flexion/lumbar/hip mobility, decompressive positioning, review HEP   Sedalia Muta, PT, DPT 1:20 PM  03/02/23

## 2023-03-05 ENCOUNTER — Ambulatory Visit (INDEPENDENT_AMBULATORY_CARE_PROVIDER_SITE_OTHER): Payer: Medicare Other | Admitting: Physical Therapy

## 2023-03-05 ENCOUNTER — Encounter: Payer: Self-pay | Admitting: Physical Therapy

## 2023-03-05 DIAGNOSIS — M5459 Other low back pain: Secondary | ICD-10-CM

## 2023-03-05 NOTE — Therapy (Signed)
OUTPATIENT PHYSICAL THERAPY THORACOLUMBAR TREATMENT   Patient Name: AMEI PIROZZI MRN: [id number] DOB:1936-04-03, 87 y.o., female Today's Date: 03/05/2023   END OF SESSION:  PT End of Session - 03/05/23 1259     Visit Number 16    Number of Visits 20    Date for PT Re-Evaluation 04/02/23    Authorization Type Medicare PN done at visit 10., re-cert done at visit 16.    PT Start Time 1303    PT Stop Time 1345    PT Time Calculation (min) 42 min    Activity Tolerance Patient tolerated treatment well    Behavior During Therapy WFL for tasks assessed/performed                  Past Medical History:  Diagnosis Date   Aortic insufficiency    a. mild by echo 2016. // Echo 06/2022: EF 50-55, no RWMA, septal movement consistent with LBBB, GR 1 DD, normal RVSF, small pericardial effusion, mild MR, mild AI, AV sclerosis without stenosis, RA pressure 3   Chest pain    Nuclear March, 2011, normal, ejection fraction 64%   Coronary artery disease involving native coronary artery of native heart without angina pectoris 07/11/2022   CCTA 9/23: CAC score 158 (49th percentile); non-obstructive CAD (mLAD 25-49, pOM1 25-49, pRCA 1-24, PLV 1-24); small pericardial effusion   Dyslipidemia    Elevated blood pressure reading    GERD (gastroesophageal reflux disease)    Patient sometimes has slight right lower jaw discomfort at the time of her reflux   LBBB (left bundle branch block)    Pericardial effusion    Echo December, 2009, small, incidental   Shoulder pain    Left scapular pain, appeared to be radicular   Past Surgical History:  Procedure Laterality Date   25 GAUGE PARS PLANA VITRECTOMY WITH 20 GAUGE MVR PORT FOR MACULAR HOLE  2000   BREAST BIOPSY  1983   benign   CHOLECYSTECTOMY  1984   IR KYPHO LUMBAR INC FX REDUCE BONE BX UNI/BIL CANNULATION INC/IMAGING  12/21/2022   IR RADIOLOGIST EVAL & MGMT  12/19/2022   IR RADIOLOGIST EVAL & MGMT  01/04/2023   MELANOMA EXCISION  2012, 2013    NASAL SEPTUM SURGERY  1993   ROTATOR CUFF REPAIR  2002   Patient Active Problem List   Diagnosis Date Noted   Compression fracture of L1 lumbar vertebra (HCC) 01/30/2023   Chronic bilateral low back pain without sciatica 01/30/2023   Osteoporosis 01/02/2023   Coronary artery disease involving native coronary artery of native heart without angina pectoris 07/11/2022   Aortic atherosclerosis (HCC) 07/11/2022   Dyslipidemia    Blood pressure alteration    GERD (gastroesophageal reflux disease)    Aortic insufficiency    Shoulder pain    Precordial chest pain    Left bundle branch block 01/03/2010    PCP: Hillard Danker  REFERRING PROVIDER: [name]   REFERRING DIAG: [name]  Rationale for Evaluation and Treatment: Rehabilitation  THERAPY DIAG:  Other low back pain  ONSET DATE:   SUBJECTIVE:  SUBJECTIVE STATEMENT: 03/05/2023   Pt states variable pain, slight soreness today.   Eval: 12/21/22 pt had kyphoplasty for L1 fracture. States no fall, etc, does have Osteoporosis. Reports No other breaks.  States Pain in bil low back L>R.  No pain meds helping/tylenol etc.   Better: heat, constant pain,  Did go to [facility], classes, no aquatic, is walking around house some for exercise.    PERTINENT HISTORY:  osteoporosis,   PAIN:  Are you having pain? Yes: NPRS scale: up to 4-5/10 Pain location: bil low lumbar Pain description: achey Aggravating factors: sitting, standing, unable to state Relieving factors: none stated   PRECAUTIONS: Other: Osteoporosis  WEIGHT BEARING RESTRICTIONS: No  FALLS:  Has patient fallen in last 6 months? No  PLOF: Independent  PATIENT GOALS: Decreased pain in back  NEXT MD VISIT:   OBJECTIVE: updated 03/02/23  DIAGNOSTIC FINDINGS:   PATIENT SURVEYS:    COGNITION: Overall cognitive status: Within functional limits for tasks assessed     SENSATION:   POSTURE: sway back posture   PALPATION:   LUMBAR ROM:   AROM 02/13/23 03/05/23  Flexion Mild limitation, pain at end range.  Mild limitation  Extension Mod/significant limitation Mod limitation  Right lateral flexion Mod limitation Mild limitation  Left lateral flexion Mod limitation Mild limitation  Right rotation    Left rotation     (Blank rows = not tested)  LOWER EXTREMITY ROM:    Hips: mild limitation for ER and IR bil,  Knees: WFL  LOWER EXTREMITY MMT:    MMT Right eval Left eval R L  Hip flexion 5 4+ 5 5  Hip extension      Hip abduction 4 4+ 4+ 4+  Hip adduction      Hip internal rotation      Hip external rotation      Knee flexion 5 5 5 5   Knee extension 5 5 5 5   Ankle dorsiflexion      Ankle plantarflexion      Ankle inversion      Ankle eversion       (Blank rows = not tested)  LUMBAR SPECIAL TESTS:   GAIT:   TODAY'S TREATMENT:                                                                                                                              DATE:   03/05/23: Therapeutic Exercise: Aerobic: ; Supine:   Supine march with TA, SLR, ea 2 x 10;  S/L : hip abd 2 x 10 bil;  Seated: sit to stand x 10;    Standing:   Stretches:  pelvic tilts x 20; standing QL x 3 bil;  LTR x 15;  SKTC 30 sec x 3 bil;  Neuromuscular Re-education: Manual Therapy:     PATIENT EDUCATION:  Education details:  reviewed HEP Person educated: Patient Education method: Explanation, Demonstration, Tactile cues, Verbal cues, and Handouts Education comprehension: verbalized understanding,  returned demonstration, verbal cues required, tactile cues required, and needs further education  HOME EXERCISE PROGRAM: Access Code: [email or web address]   ASSESSMENT:  CLINICAL IMPRESSION: 03/05/2023  Pt has been seen for 16 visits. She is having variable pain, but much improved pain  intensity and frequency from start of care. She has done well to increase her activity level, and reports doing most all regular activities at this time. She has made good improvements with trials for pain relief and self management. She continues to have variable pain that is bothersome to her, and has not made significant pain improvements in the last couple weeks. She will be seen for 1 more visit, after her MD visit this week, plan to d/c at that time.   Eval: Patient presents with primary complaint of increased pain in low back, following recent surgery for compression fracture. She has increased muscle tension and tenderness in bil low thoracic and lumbar region. She has increased stiffness in t and l spine, with decreased ROM, and poor movement mechanics. Pt with decreased ability for full functional activities, and will benefit from skilled PT to improve deficits and pain.   OBJECTIVE IMPAIRMENTS: decreased activity tolerance, decreased mobility, difficulty walking, decreased ROM, decreased strength, hypomobility, impaired perceived functional ability, increased muscle spasms, impaired flexibility, improper body mechanics, and pain.   ACTIVITY LIMITATIONS: carrying, lifting, bending, sitting, standing, squatting, transfers, and locomotion level  PARTICIPATION LIMITATIONS: meal prep, cleaning, laundry, driving, shopping, and community activity  PERSONAL FACTORS:  none  are also affecting patient's functional outcome.   REHAB POTENTIAL: Good  CLINICAL DECISION MAKING: Stable/uncomplicated  EVALUATION COMPLEXITY: Low   GOALS: Goals reviewed with patient? Yes  SHORT TERM GOALS: Target date: 01/19/2023  Pt to be independent with initial HEP  Goal status: MET  2.  Pt to report comfortable seated positioning at home in chair or couch.   Goal status: MET   LONG TERM GOALS: Target date: 04/02/2023    Pt to be independent with final HEP  Goal status: IN PROGRESS  2.  Pt to report  decreased pain in back to 0-3/10 with activity and IADLs.   Goal status: IN PROGRESS  3.  Pt to demo improved postural awareness for standing and sitting, for decreased pressure and pain.   Goal status: MET  4.  Pt to demo optimal bend, lift, squat mechanics to improve pain and ability for IADLs.    Goal status: MET   PLAN:  PT FREQUENCY: 1-2x/week  PT DURATION: 8 weeks  PLANNED INTERVENTIONS: Therapeutic exercises, Therapeutic activity, Neuromuscular re-education, Balance training, Gait training, Patient/Family education, Self Care, Joint mobilization, Joint manipulation, Orthotic/Fit training, DME instructions, Aquatic Therapy, Dry Needling, Electrical stimulation, Spinal manipulation, Spinal mobilization, Cryotherapy, Moist heat, Taping, Traction, Ultrasound, Ionotophoresis 4mg /ml Dexamethasone, and Manual therapy.  PLAN FOR NEXT SESSION: light flexion/lumbar/hip mobility, decompressive positioning, review HEP   Sedalia Muta, PT, DPT 8:39 PM  03/05/23

## 2023-03-06 ENCOUNTER — Encounter: Payer: Self-pay | Admitting: Family Medicine

## 2023-03-06 ENCOUNTER — Ambulatory Visit (INDEPENDENT_AMBULATORY_CARE_PROVIDER_SITE_OTHER): Payer: Medicare Other | Admitting: Family Medicine

## 2023-03-06 VITALS — BP 130/84 | HR 80 | Ht 66.0 in | Wt 105.8 lb

## 2023-03-06 DIAGNOSIS — S32010S Wedge compression fracture of first lumbar vertebra, sequela: Secondary | ICD-10-CM

## 2023-03-06 DIAGNOSIS — M8000XD Age-related osteoporosis with current pathological fracture, unspecified site, subsequent encounter for fracture with routine healing: Secondary | ICD-10-CM | POA: Diagnosis not present

## 2023-03-06 DIAGNOSIS — M545 Low back pain, unspecified: Secondary | ICD-10-CM

## 2023-03-06 DIAGNOSIS — G8929 Other chronic pain: Secondary | ICD-10-CM | POA: Diagnosis not present

## 2023-03-06 NOTE — Patient Instructions (Signed)
Thank you for coming in today.   Continue home exercises learned in PT.   Recheck in 2 months.   OK to continue calcium and VitD as you are.   Let me know if you have any problems.   Tylenol arthritis 2 pills every 8 hours as needed.   Gentle massage is OK.   More to do if needed like back injection.

## 2023-03-06 NOTE — Progress Notes (Signed)
I, Stevenson Clinch, CMA acting as a scribe for Julia Graham, MD.  Julia [name] is a 87 y.o. female who presents to [facility] at [facility] today for f/u low back pain and osteoporosis management. A kyphoplasty was preformed on 12/21/22 for her L1 compression fx. She was also given her 1st Evenity injection on 02/21/23. Pt was last seen by Dr. Denyse [name] on 01/30/23 and her PT order was extended, completing 16 visits. Today, pt reports having more days without pain. Completes PT next week. PT has been helpful. Compliant with HEP. Still has debilitating pain at times.   Dx imaging: 12/12/22 L-spine MRI             12/07/22 L-spine XR  Pertinent review of systems: No fevers or chills  Relevant historical information: Heart disease.  Osteoporosis with lumbar compression fracture.   Exam:  BP 130/84   Pulse 80   Ht 5\' 6"  (1.676 m)   Wt 105 lb 12.8 oz (48 kg)   SpO2 98%   BMI 17.08 kg/m  General: Well Developed, well nourished, and in no acute distress.   MSK: L-spine normal appearing Nontender palpation spinal midline.  Tender palpation lumbar paraspinal musculature lower portion lumbar spine. Decreased lumbar motion.    Lab and Radiology Results  EXAM: LUMBAR SPINE - 2-3 VIEW   COMPARISON:  December 07, 2022   FINDINGS: The patient is status post kyphoplasty at L1. The L1 vertebral body height is 18 mm centrally today, not significantly changed since the December 12, 2022 MRI. There is mild kyphosis centered at this level. Grade 1 retrolisthesis of L2 versus L3 and L3 versus L4. No other malalignment. Multilevel degenerative disc disease and lower lumbar facet degenerative changes. No other interval changes.   IMPRESSION: 1. Status post kyphoplasty at L1. The L1 vertebral body height is 18 mm centrally today, not significantly changed since the December 12, 2022 MRI. 2. Degenerative changes as above.     Electronically Signed   By: Julia [name] M.D.    On: 01/03/2023 10:23.xray     Assessment and Plan: 87 y.o. female with chronic low back pain.  This has improved after physical therapy and a kyphoplasty.  She still is experiencing intermittent low back pain that is significant at times.  I discussed the case with her physical therapist.  She has 1 more PT session left and will be transition to home exercise program at that time.  After discussion with Julia [name] will continue home exercise program and give it a month or 2 and see how she feels.  If her symptoms are well-controlled enough continue home exercise program.  If not better enough in about 2 months we can move to facet injections.  She has multilevel bilateral facet DJD seen on her relatively recent lumbar spine MRI.  These could be potential targets for injection.  As for her osteoporosis she has started Evenity and now had 1 Evenity injection without any side effects.  She is taking calcium and vitamin D which I think is a good idea.  She is doing weightbearing exercise which I think is a good idea.  Total course of Evenity will be for 1 year and then from there transfer to [facility].  Total encounter time 30 minutes including face-to-face time with the patient and, reviewing past medical record, and charting on the date of service.     Discussed warning signs or symptoms. Please see discharge instructions. Patient expresses understanding.  The above documentation has been reviewed and is accurate and complete Julia [name], M.D.

## 2023-03-08 ENCOUNTER — Encounter: Payer: Medicare Other | Admitting: Physical Therapy

## 2023-03-13 ENCOUNTER — Encounter: Payer: Self-pay | Admitting: Physical Therapy

## 2023-03-13 ENCOUNTER — Ambulatory Visit (INDEPENDENT_AMBULATORY_CARE_PROVIDER_SITE_OTHER): Payer: Medicare Other | Admitting: Physical Therapy

## 2023-03-13 ENCOUNTER — Encounter: Payer: Self-pay | Admitting: Family Medicine

## 2023-03-13 DIAGNOSIS — M5459 Other low back pain: Secondary | ICD-10-CM | POA: Diagnosis not present

## 2023-03-13 NOTE — Therapy (Signed)
OUTPATIENT PHYSICAL THERAPY THORACOLUMBAR TREATMENT   Patient Name: Julia [name] MRN: [id number] DOB:April 04, 1936, 87 y.o., female Today's Date: 03/13/2023   END OF SESSION:  PT End of Session - 03/13/23 1146     Visit Number 17    Number of Visits 20    Date for PT Re-Evaluation 04/02/23    Authorization Type Medicare PN done at visit 10., re-cert done at visit 16.    PT Start Time 1100    PT Stop Time 1135    PT Time Calculation (min) 35 min    Activity Tolerance Patient tolerated treatment well    Behavior During Therapy WFL for tasks assessed/performed                   Past Medical History:  Diagnosis Date   Aortic insufficiency    a. mild by echo 2016. // Echo 06/2022: EF 50-55, no RWMA, septal movement consistent with LBBB, GR 1 DD, normal RVSF, small pericardial effusion, mild MR, mild AI, AV sclerosis without stenosis, RA pressure 3   Chest pain    Nuclear March, 2011, normal, ejection fraction 64%   Coronary artery disease involving native coronary artery of native heart without angina pectoris 07/11/2022   CCTA 9/23: CAC score 158 (49th percentile); non-obstructive CAD (mLAD 25-49, pOM1 25-49, pRCA 1-24, PLV 1-24); small pericardial effusion   Dyslipidemia    Elevated blood pressure reading    GERD (gastroesophageal reflux disease)    Patient sometimes has slight right lower jaw discomfort at the time of her reflux   LBBB (left bundle branch block)    Pericardial effusion    Echo December, 2009, small, incidental   Shoulder pain    Left scapular pain, appeared to be radicular   Past Surgical History:  Procedure Laterality Date   25 GAUGE PARS PLANA VITRECTOMY WITH 20 GAUGE MVR PORT FOR MACULAR HOLE  2000   BREAST BIOPSY  1983   benign   CHOLECYSTECTOMY  1984   IR KYPHO LUMBAR INC FX REDUCE BONE BX UNI/BIL CANNULATION INC/IMAGING  12/21/2022   IR RADIOLOGIST EVAL & MGMT  12/19/2022   IR RADIOLOGIST EVAL & MGMT  01/04/2023   MELANOMA EXCISION  2012,  2013   NASAL SEPTUM SURGERY  1993   ROTATOR CUFF REPAIR  2002   Patient Active Problem List   Diagnosis Date Noted   Compression fracture of L1 lumbar vertebra (HCC) 01/30/2023   Chronic bilateral low back pain without sciatica 01/30/2023   Osteoporosis 01/02/2023   Coronary artery disease involving native coronary artery of native heart without angina pectoris 07/11/2022   Aortic atherosclerosis (HCC) 07/11/2022   Dyslipidemia    Blood pressure alteration    GERD (gastroesophageal reflux disease)    Aortic insufficiency    Shoulder pain    Precordial chest pain    Left bundle branch block 01/03/2010    PCP: Hillard Danker  REFERRING PROVIDER: [name]   REFERRING DIAG: [name]  Rationale for Evaluation and Treatment: Rehabilitation  THERAPY DIAG:  Other low back pain  ONSET DATE:   SUBJECTIVE:  SUBJECTIVE STATEMENT: 03/13/2023   Pt states variable pain, has had a few good days last week, and some days with more pain, burning into L side. Has been doing her HEP.   Eval: 12/21/22 pt had kyphoplasty for L1 fracture. States no fall, etc, does have Osteoporosis. Reports No other breaks.  States Pain in bil low back L>R.  No pain meds helping/tylenol etc.   Better: heat, constant pain,  Did go to [facility], classes, no aquatic, is walking around house some for exercise.    PERTINENT HISTORY:  osteoporosis,   PAIN:  Are you having pain? Yes: NPRS scale: up to 4-5/10 Pain location: bil low lumbar Pain description: achey Aggravating factors: sitting, standing, unable to state Relieving factors: none stated   PRECAUTIONS: Other: Osteoporosis  WEIGHT BEARING RESTRICTIONS: No  FALLS:  Has patient fallen in last 6 months? No  PLOF: Independent  PATIENT GOALS: Decreased pain in back  NEXT  MD VISIT:   OBJECTIVE: updated 03/02/23  DIAGNOSTIC FINDINGS:   PATIENT SURVEYS:   COGNITION: Overall cognitive status: Within functional limits for tasks assessed     SENSATION:   POSTURE: sway back posture   PALPATION:   LUMBAR ROM:   AROM 02/13/23 03/05/23  Flexion Mild limitation, pain at end range.  Mild limitation  Extension Mod/significant limitation Mod limitation  Right lateral flexion Mod limitation Mild limitation  Left lateral flexion Mod limitation Mild limitation  Right rotation    Left rotation     (Blank rows = not tested)  LOWER EXTREMITY ROM:    Hips: mild limitation for ER and IR bil,  Knees: WFL  LOWER EXTREMITY MMT:    MMT Right eval Left eval R  L  Hip flexion 5 4+ 5 5  Hip extension      Hip abduction 4 4+ 4+ 4+  Hip adduction      Hip internal rotation      Hip external rotation      Knee flexion 5 5 5 5   Knee extension 5 5 5 5   Ankle dorsiflexion      Ankle plantarflexion      Ankle inversion      Ankle eversion       (Blank rows = not tested)   TODAY'S TREATMENT:                                                                                                                              DATE:   03/12/22: Therapeutic Exercise: Aerobic: ; Supine:    Seated: sit to stand 2 x 10;   pelvic tilts x 20 with review of upright posture  Standing:  Marching 2 x 20, no UE support ;  Other: specific review of all exercises for HEP with looking at pictures printed for HEP and discussing frequency for each.  Stretches:   Neuromuscular Re-education: Manual Therapy:     03/05/23: Therapeutic Exercise: Aerobic: ; Supine:   Supine march  with TA, SLR, ea 2 x 10;  S/L : hip abd 2 x 10 bil;  Seated: sit to stand x 10;    Standing:   Stretches:  pelvic tilts x 20; standing QL x 3 bil;  LTR x 15;  SKTC 30 sec x 3 bil;  Neuromuscular Re-education: Manual Therapy:     PATIENT EDUCATION:  Education details:  reviewed HEP Person educated:  Patient Education method: Programmer, multimedia, Demonstration, Tactile cues, Verbal cues, and Handouts Education comprehension: verbalized understanding, returned demonstration, verbal cues required, tactile cues required, and needs further education  HOME EXERCISE PROGRAM: Access Code: [email or web address]   ASSESSMENT:  CLINICAL IMPRESSION: 03/13/2023  Pt is having improved pain intensity and frequency from start of care. She has done well to increase her activity level, and reports doing most all regular activities at this time. She continues to have variable pain at times, but has not made significant pain improvements in the last couple weeks. We reviewed in detail her HEP again today. Pt with decreased memory for reviewing same exercises multiple times over the last month or longer. She will be d/c at this time, and will continue HEP to improve strength. She will f/u with MD in another 2 months. Pt in agreement with plan.   Eval: Patient presents with primary complaint of increased pain in low back, following recent surgery for compression fracture. She has increased muscle tension and tenderness in bil low thoracic and lumbar region. She has increased stiffness in t and l spine, with decreased ROM, and poor movement mechanics. Pt with decreased ability for full functional activities, and will benefit from skilled PT to improve deficits and pain.   OBJECTIVE IMPAIRMENTS: decreased activity tolerance, decreased mobility, difficulty walking, decreased ROM, decreased strength, hypomobility, impaired perceived functional ability, increased muscle spasms, impaired flexibility, improper body mechanics, and pain.   ACTIVITY LIMITATIONS: carrying, lifting, bending, sitting, standing, squatting, transfers, and locomotion level  PARTICIPATION LIMITATIONS: meal prep, cleaning, laundry, driving, shopping, and community activity  PERSONAL FACTORS:  none  are also affecting patient's functional outcome.   REHAB POTENTIAL:  Good  CLINICAL DECISION MAKING: Stable/uncomplicated  EVALUATION COMPLEXITY: Low   GOALS: Goals reviewed with patient? Yes  SHORT TERM GOALS: Target date: 01/19/2023  Pt to be independent with initial HEP  Goal status: MET  2.  Pt to report comfortable seated positioning at home in chair or couch.   Goal status: MET   LONG TERM GOALS: Target date: 04/02/2023    Pt to be independent with final HEP  Goal status: MET  2.  Pt to report decreased pain in back to 0-3/10 with activity and IADLs.   Goal status: partially met   3.  Pt to demo improved postural awareness for standing and sitting, for decreased pressure and pain.   Goal status: MET  4.  Pt to demo optimal bend, lift, squat mechanics to improve pain and ability for IADLs.    Goal status: MET   PLAN:  PT FREQUENCY: 1-2x/week  PT DURATION: 8 weeks  PLANNED INTERVENTIONS: Therapeutic exercises, Therapeutic activity, Neuromuscular re-education, Balance training, Gait training, Patient/Family education, Self Care, Joint mobilization, Joint manipulation, Orthotic/Fit training, DME instructions, Aquatic Therapy, Dry Needling, Electrical stimulation, Spinal manipulation, Spinal mobilization, Cryotherapy, Moist heat, Taping, Traction, Ultrasound, Ionotophoresis 4mg /ml Dexamethasone, and Manual therapy.  PLAN FOR NEXT SESSION: light flexion/lumbar/hip mobility, decompressive positioning, review HEP   Sedalia Muta, PT, DPT 11:46 AM  03/13/23  PHYSICAL THERAPY DISCHARGE SUMMARY  Visits from Start of Care: 17  Plan: Patient agrees to discharge.  Patient goals were partially met. Patient is being discharged due to meeting the stated rehab goals, at max potential   Sedalia Muta, PT, DPT 11:51 AM  03/13/23

## 2023-03-14 NOTE — Telephone Encounter (Signed)
Forwarding to Dr. Corey to advise.  

## 2023-03-15 ENCOUNTER — Encounter: Payer: Medicare Other | Admitting: Physical Therapy

## 2023-03-21 DIAGNOSIS — H4051X3 Glaucoma secondary to other eye disorders, right eye, severe stage: Secondary | ICD-10-CM | POA: Diagnosis not present

## 2023-03-23 ENCOUNTER — Ambulatory Visit (INDEPENDENT_AMBULATORY_CARE_PROVIDER_SITE_OTHER): Payer: Medicare Other

## 2023-03-23 DIAGNOSIS — M8000XD Age-related osteoporosis with current pathological fracture, unspecified site, subsequent encounter for fracture with routine healing: Secondary | ICD-10-CM | POA: Diagnosis not present

## 2023-03-23 MED ORDER — ROMOSOZUMAB-AQQG 105 MG/1.17ML ~~LOC~~ SOSY
210.0000 mg | PREFILLED_SYRINGE | Freq: Once | SUBCUTANEOUS | Status: AC
  Administered 2023-03-23: 210 mg via SUBCUTANEOUS

## 2023-03-23 NOTE — Progress Notes (Signed)
Patient received Evenity injections in bilateral upper arms per Dr. Denyse Amass. Patient tolerated injections well

## 2023-03-30 ENCOUNTER — Telehealth: Payer: Self-pay | Admitting: Interventional Cardiology

## 2023-03-30 NOTE — Telephone Encounter (Signed)
I spoke with patient and gave her message from Dr Varanasi 

## 2023-03-30 NOTE — Telephone Encounter (Signed)
Pt c/o of Chest Pain: STAT if CP now or developed within 24 hours  1. Are you having CP right now?   No  2. Are you experiencing any other symptoms (ex. SOB, nausea, vomiting, sweating)? No  3. How long have you been experiencing CP? Early this morning for a few minutes  4. Is your CP continuous or coming and going?  Continuous which lasted a few minutes  5. Have you taken Nitroglycerin?   Patient stated she had chest pains around 1:00 am which lasted a few minutes and she  drank water and then it went away.  Patient is concerned about next steps.

## 2023-03-30 NOTE — Telephone Encounter (Signed)
Returned call to patient. She states she had an episode of chest pain around 1:30 am this morning. She denies any SOB, sweating, radiating pain. She states she did not take any NTG. Reports CP lasted about 2 minutes, was relieved by drinking water.  Informed patient this sounds more like indigestion rather than cardiac-related. She states this makes her feel better to hear and relieves anxiety she was feeling about this episode she had.   Advised patient if she experiences CP that lasts more than 5 minutes or is associated with SOB, N/V, radiating pain to neck/jaw, or sweating that she would need to have this evaluated at ED immediately. Advised on NTG use.  Patient verbalized understanding. She also mentioned she is taking Evenity for osteoporosis treatment, and is concerned about potential for cardiac events associated with use of this medication.  Will forward to Dr. Eldridge Dace to review.

## 2023-03-30 NOTE — Telephone Encounter (Signed)
I agree that this does not seem to be a cardiac incident.

## 2023-04-16 ENCOUNTER — Encounter: Payer: Self-pay | Admitting: Interventional Cardiology

## 2023-04-16 NOTE — Telephone Encounter (Signed)
Julia [name], [facility]      04/16/23 10:43 AM Ok to take with her current medications.

## 2023-04-23 ENCOUNTER — Ambulatory Visit (INDEPENDENT_AMBULATORY_CARE_PROVIDER_SITE_OTHER): Payer: Medicare Other

## 2023-04-23 DIAGNOSIS — M8000XD Age-related osteoporosis with current pathological fracture, unspecified site, subsequent encounter for fracture with routine healing: Secondary | ICD-10-CM | POA: Diagnosis not present

## 2023-04-23 MED ORDER — ROMOSOZUMAB-AQQG 105 MG/1.17ML ~~LOC~~ SOSY
210.0000 mg | PREFILLED_SYRINGE | Freq: Once | SUBCUTANEOUS | Status: AC
  Administered 2023-04-23: 210 mg via SUBCUTANEOUS

## 2023-04-23 NOTE — Progress Notes (Signed)
Pt received Evenity injection today. 1.17 mL, SQ, bilateral upper arms. Pt tolerating inj well.

## 2023-04-26 ENCOUNTER — Encounter: Payer: Self-pay | Admitting: Interventional Cardiology

## 2023-05-07 NOTE — Progress Notes (Unsigned)
   Rubin Payor, PhD, LAT, ATC acting as a scribe for [name], MD.  Julia [name] is a 87 y.o. female who presents to [facility] at [facility] today for 75-month  f/u low back pain and osteoporosis management. A kyphoplasty was preformed on 12/21/22 for her L1 compression fx. She was also given her 3rd Evenity injection on June 24th. Pt was last seen by Dr. Denyse Amass on 03/06/23 and was advised to cont calcium, vitamin D, HEP, and weightbearing exercise.   Today, pt reports ***  Dx imaging: 12/12/22 L-spine MRI             12/07/22 L-spine XR  Pertinent review of systems: ***  Relevant historical information: ***   Exam:  There were no vitals taken for this visit. General: Well Developed, well nourished, and in no acute distress.   MSK: ***    Lab and Radiology Results No results found for this or any previous visit (from the past 72 hour(s)). No results found.     Assessment and Plan: 87 y.o. female with ***   PDMP not reviewed this encounter. No orders of the defined types were placed in this encounter.  No orders of the defined types were placed in this encounter.    Discussed warning signs or symptoms. Please see discharge instructions. Patient expresses understanding.   ***

## 2023-05-08 ENCOUNTER — Encounter: Payer: Self-pay | Admitting: Family Medicine

## 2023-05-08 ENCOUNTER — Ambulatory Visit (INDEPENDENT_AMBULATORY_CARE_PROVIDER_SITE_OTHER): Payer: Medicare Other | Admitting: Family Medicine

## 2023-05-08 VITALS — BP 124/80 | HR 74 | Ht 66.0 in | Wt 106.4 lb

## 2023-05-08 DIAGNOSIS — G8929 Other chronic pain: Secondary | ICD-10-CM

## 2023-05-08 DIAGNOSIS — M8000XD Age-related osteoporosis with current pathological fracture, unspecified site, subsequent encounter for fracture with routine healing: Secondary | ICD-10-CM | POA: Diagnosis not present

## 2023-05-08 DIAGNOSIS — M545 Low back pain, unspecified: Secondary | ICD-10-CM

## 2023-05-08 NOTE — Patient Instructions (Signed)
Thank you for coming in today.    Continue home exercises.   Recheck with as needed.   Plan to transition to Prolia after 12 months of Evenity.

## 2023-05-24 DIAGNOSIS — H4051X3 Glaucoma secondary to other eye disorders, right eye, severe stage: Secondary | ICD-10-CM | POA: Diagnosis not present

## 2023-05-25 ENCOUNTER — Ambulatory Visit (INDEPENDENT_AMBULATORY_CARE_PROVIDER_SITE_OTHER): Payer: Medicare Other

## 2023-05-25 DIAGNOSIS — M8000XD Age-related osteoporosis with current pathological fracture, unspecified site, subsequent encounter for fracture with routine healing: Secondary | ICD-10-CM | POA: Diagnosis not present

## 2023-05-25 MED ORDER — ROMOSOZUMAB-AQQG 105 MG/1.17ML ~~LOC~~ SOSY
210.0000 mg | PREFILLED_SYRINGE | Freq: Once | SUBCUTANEOUS | Status: AC
  Administered 2023-05-25: 210 mg via SUBCUTANEOUS

## 2023-05-25 NOTE — Progress Notes (Signed)
Evenity 105 mg / 1.17 mL, given SQ, bilateral upper arms. Pt tolerated well.

## 2023-06-08 ENCOUNTER — Encounter: Payer: Self-pay | Admitting: Family Medicine

## 2023-06-11 NOTE — Telephone Encounter (Signed)
Called pt, advised that she was currently unavailable. I will let Dr Denyse Amass know and attempt to contact pt tomorrow.

## 2023-06-15 DIAGNOSIS — H903 Sensorineural hearing loss, bilateral: Secondary | ICD-10-CM | POA: Diagnosis not present

## 2023-06-15 DIAGNOSIS — H6123 Impacted cerumen, bilateral: Secondary | ICD-10-CM | POA: Diagnosis not present

## 2023-06-15 DIAGNOSIS — H838X3 Other specified diseases of inner ear, bilateral: Secondary | ICD-10-CM | POA: Diagnosis not present

## 2023-06-22 NOTE — Progress Notes (Unsigned)
   Rubin Payor, PhD, LAT, ATC acting as a scribe for [name], MD.  Julia [name] is a 87 y.o. female who presents to [facility] at [facility] today for lightheadedness and brain fog. A kyphoplasty was preformed on 12/21/22 for her L1 compression fx. She was also given her 4th Evenity injection on July 26th. Pt sent a MyChart message on Aug 9th c/o lightheadedness and brain fog and she was advised to have a visit prior to her next Evenity injection.   Today, pt reports ***  Dx imaging: 12/12/22 L-spine MRI             12/07/22 L-spine XR  Pertinent review of systems: ***  Relevant historical information: ***   Exam:  There were no vitals taken for this visit. General: Well Developed, well nourished, and in no acute distress.   MSK: ***    Lab and Radiology Results No results found for this or any previous visit (from the past 72 hour(s)). No results found.     Assessment and Plan: 87 y.o. female with ***   PDMP not reviewed this encounter. No orders of the defined types were placed in this encounter.  No orders of the defined types were placed in this encounter.    Discussed warning signs or symptoms. Please see discharge instructions. Patient expresses understanding.   ***

## 2023-06-25 ENCOUNTER — Encounter: Payer: Self-pay | Admitting: Family Medicine

## 2023-06-25 ENCOUNTER — Ambulatory Visit (INDEPENDENT_AMBULATORY_CARE_PROVIDER_SITE_OTHER): Payer: Medicare Other | Admitting: Family Medicine

## 2023-06-25 ENCOUNTER — Ambulatory Visit (INDEPENDENT_AMBULATORY_CARE_PROVIDER_SITE_OTHER): Payer: Medicare Other

## 2023-06-25 VITALS — BP 142/88 | HR 76 | Ht 66.0 in | Wt 105.0 lb

## 2023-06-25 DIAGNOSIS — M545 Low back pain, unspecified: Secondary | ICD-10-CM

## 2023-06-25 DIAGNOSIS — Z5181 Encounter for therapeutic drug level monitoring: Secondary | ICD-10-CM | POA: Diagnosis not present

## 2023-06-25 DIAGNOSIS — R202 Paresthesia of skin: Secondary | ICD-10-CM | POA: Diagnosis not present

## 2023-06-25 DIAGNOSIS — M5126 Other intervertebral disc displacement, lumbar region: Secondary | ICD-10-CM | POA: Diagnosis not present

## 2023-06-25 DIAGNOSIS — M47816 Spondylosis without myelopathy or radiculopathy, lumbar region: Secondary | ICD-10-CM | POA: Diagnosis not present

## 2023-06-25 DIAGNOSIS — M8000XD Age-related osteoporosis with current pathological fracture, unspecified site, subsequent encounter for fracture with routine healing: Secondary | ICD-10-CM

## 2023-06-25 DIAGNOSIS — R42 Dizziness and giddiness: Secondary | ICD-10-CM

## 2023-06-25 DIAGNOSIS — G8929 Other chronic pain: Secondary | ICD-10-CM

## 2023-06-25 DIAGNOSIS — M4856XA Collapsed vertebra, not elsewhere classified, lumbar region, initial encounter for fracture: Secondary | ICD-10-CM | POA: Diagnosis not present

## 2023-06-25 DIAGNOSIS — M5136 Other intervertebral disc degeneration, lumbar region: Secondary | ICD-10-CM | POA: Diagnosis not present

## 2023-06-25 LAB — COMPREHENSIVE METABOLIC PANEL
ALT: 17 U/L (ref 0–35)
AST: 19 U/L (ref 0–37)
Albumin: 4.2 g/dL (ref 3.5–5.2)
Alkaline Phosphatase: 81 U/L (ref 39–117)
BUN: 13 mg/dL (ref 6–23)
CO2: 28 mEq/L (ref 19–32)
Calcium: 9.1 mg/dL (ref 8.4–10.5)
Chloride: 98 mEq/L (ref 96–112)
Creatinine, Ser: 0.6 mg/dL (ref 0.40–1.20)
GFR: 82.09 mL/min (ref >60.00)
Glucose, Bld: 94 mg/dL (ref 70–99)
Potassium: 4 mEq/L (ref 3.5–5.1)
Sodium: 133 mEq/L — ABNORMAL LOW (ref 135–145)
Total Bilirubin: 0.7 mg/dL (ref 0.2–1.2)
Total Protein: 6.8 g/dL (ref 6.0–8.3)

## 2023-06-25 LAB — PHOSPHORUS: Phosphorus: 4.2 mg/dL (ref 2.3–4.6)

## 2023-06-25 LAB — MAGNESIUM: Magnesium: 2.3 mg/dL (ref 1.5–2.5)

## 2023-06-25 MED ORDER — ROMOSOZUMAB-AQQG 105 MG/1.17ML ~~LOC~~ SOSY
210.0000 mg | PREFILLED_SYRINGE | Freq: Once | SUBCUTANEOUS | Status: AC
  Administered 2023-06-25: 210 mg via SUBCUTANEOUS

## 2023-06-25 NOTE — Patient Instructions (Addendum)
Thank you for coming in today.   Please get an Xray today before you leave   You received an injection today. Seek immediate medical attention if the joint becomes red, extremely painful, or is oozing fluid.   Please get labs today before you leave   You should hear from [facility] soon about scheduling the nerve test.  Please let me know if you do not hear anything.

## 2023-06-26 ENCOUNTER — Ambulatory Visit: Payer: Medicare Other

## 2023-06-26 ENCOUNTER — Other Ambulatory Visit: Payer: Self-pay | Admitting: Physician Assistant

## 2023-06-26 NOTE — Progress Notes (Signed)
Labs from yesterday look okay.  Sodium is a touch low but that looks about the same as it was 4 months ago.  Kidney function and calcium look okay.  Phosphorus and magnesium are also okay.

## 2023-06-27 ENCOUNTER — Encounter: Payer: Self-pay | Admitting: Interventional Cardiology

## 2023-06-29 ENCOUNTER — Encounter: Payer: Self-pay | Admitting: Neurology

## 2023-07-03 DIAGNOSIS — H43812 Vitreous degeneration, left eye: Secondary | ICD-10-CM | POA: Diagnosis not present

## 2023-07-03 DIAGNOSIS — H524 Presbyopia: Secondary | ICD-10-CM | POA: Diagnosis not present

## 2023-07-03 NOTE — Progress Notes (Signed)
Lumbar spine x-ray shows no fractures.  You do have some arthritis.

## 2023-07-23 ENCOUNTER — Encounter: Payer: Self-pay | Admitting: Family Medicine

## 2023-07-23 NOTE — Telephone Encounter (Signed)
Forwarding to Dr. Denyse Amass to confirm that there is no contraindication.

## 2023-07-26 ENCOUNTER — Ambulatory Visit (INDEPENDENT_AMBULATORY_CARE_PROVIDER_SITE_OTHER): Payer: Medicare Other | Admitting: Neurology

## 2023-07-26 DIAGNOSIS — R202 Paresthesia of skin: Secondary | ICD-10-CM

## 2023-07-26 NOTE — Procedures (Signed)
[facility]  [address] [place], [address]  [place], [place] [address] Tel: [phone] Fax: [phone] Test Date:  07/26/2023  Patient: Julia [name] DOB: [birth year] Physician: Nita Sickle, DO  Sex: Female Height: 5\' 6"  Ref Phys: Rodolph Bong, MD  ID#: [id number]   Technician:    History: This is a 87 year old female referred for evaluation of bilateral leg paresthesias.  NCV & EMG Findings: Extensive electrodiagnostic testing of the right lower extremity and additional studies of the left shows:  Bilateral sural and superficial peroneal sensory responses are within normal limits. Bilateral peroneal motor responses are mildly reduced at the extensor digitorum brevis, and normal at the tibialis anterior; these findings are normal for patient's age. Bilateral tibial motor responses are within normal limits. Bilateral tibial H reflex studies are within normal limits. There is no evidence of active or chronic motor axonal loss changes affecting any of the tested muscles.  Motor unit configuration and recruitment pattern is within normal limits.  Impression: This is a normal study of the lower extremities.  In particular, there is no evidence of a large fiber sensorimotor polyneuropathy or lumbosacral radiculopathy.   ___________________________ Nita Sickle, DO    Nerve Conduction Studies   Stim Site NR Peak (ms) Norm Peak (ms) O-P Amp (V) Norm O-P Amp  Left Sup Peroneal Anti Sensory (Ant Lat Mall)  32 C  12 cm    3.1 <4.6 5.5 >3  Right Sup Peroneal Anti Sensory (Ant Lat Mall)  32 C  12 cm    2.8 <4.6 6.8 >3  Left Sural Anti Sensory (Lat Mall)  32 C  Calf    2.9 <4.6 11.4 >3  Right Sural Anti Sensory (Lat Mall)  32 C  Calf    2.8 <4.6 10.8 >3     Stim Site NR Onset (ms) Norm Onset (ms) O-P Amp (mV) Norm O-P Amp Site1 Site2 Delta-0 (ms) Dist (cm) Vel (m/s) Norm Vel (m/s)  Left Peroneal Motor (Ext Dig Brev)  32 C  Ankle    4.8 <6.0 *2.2 >2.5 B Fib Ankle 7.3  34.0 47 >40  B Fib    12.1  2.0  Poplt B Fib 1.8 8.0 44 >40  Poplt    13.9  2.0         Right Peroneal Motor (Ext Dig Brev)  32 C  Ankle    3.5 <6.0 *1.4 >2.5 B Fib Ankle 7.6 35.0 46 >40  B Fib    11.1  1.4  Poplt B Fib 1.7 8.0 47 >40  Poplt    12.8  1.3         Left Peroneal TA Motor (Tib Ant)  32 C  Fib Head    3.3 <4.5 4.4 >3 Poplit Fib Head 1.4 8.0 57 >40  Poplit    4.7 <5.7 4.4         Right Peroneal TA Motor (Tib Ant)  32 C  Fib Head    2.8 <4.5 4.9 >3 Poplit Fib Head 1.3 8.0 62 >40  Poplit    4.1 <5.7 4.8         Left Tibial Motor (Abd Hall Brev)  32 C  Ankle    3.4 <6.0 6.6 >4 Knee Ankle 8.2 39.0 48 >40  Knee    11.6  4.8         Right Tibial Motor (Abd Hall Brev)  32 C  Ankle    3.4 <6.0 9.5 >4 Knee Ankle 8.9 41.0  46 >40  Knee    12.3  6.1          Electromyography   Side Muscle Ins.Act Fibs Fasc Recrt Amp Dur Poly Activation Comment  Right AntTibialis Nml Nml Nml Nml Nml Nml Nml Nml N/A  Right Gastroc Nml Nml Nml Nml Nml Nml Nml Nml N/A  Right Flex Dig Long Nml Nml Nml Nml Nml Nml Nml Nml N/A  Right RectFemoris Nml Nml Nml Nml Nml Nml Nml Nml N/A  Right BicepsFemS Nml Nml Nml Nml Nml Nml Nml Nml N/A  Right GluteusMed Nml Nml Nml Nml Nml Nml Nml Nml N/A  Left AntTibialis Nml Nml Nml Nml Nml Nml Nml Nml N/A  Left Gastroc Nml Nml Nml Nml Nml Nml Nml Nml N/A  Left Flex Dig Long Nml Nml Nml Nml Nml Nml Nml Nml N/A  Left RectFemoris Nml Nml Nml Nml Nml Nml Nml Nml N/A  Left GluteusMed Nml Nml Nml Nml Nml Nml Nml Nml N/A  Left BicepsFemS Nml Nml Nml Nml Nml Nml Nml Nml N/A      Waveforms:

## 2023-07-27 ENCOUNTER — Ambulatory Visit: Payer: Medicare Other

## 2023-07-27 ENCOUNTER — Encounter: Payer: Self-pay | Admitting: Family Medicine

## 2023-07-27 NOTE — Progress Notes (Signed)
Nerve conduction study does not explain the degree of numbness and tingling you are having in your legs.  The nerve study looked normal.  That does not mean everything is normal this means this test could not see [place].  We could turn today's nurse visit for Evenity and do an office visit to talk about the results of the nerve test if you would like.

## 2023-07-30 ENCOUNTER — Ambulatory Visit: Payer: Medicare Other

## 2023-07-30 DIAGNOSIS — M8000XD Age-related osteoporosis with current pathological fracture, unspecified site, subsequent encounter for fracture with routine healing: Secondary | ICD-10-CM

## 2023-07-30 MED ORDER — ROMOSOZUMAB-AQQG 105 MG/1.17ML ~~LOC~~ SOSY
210.0000 mg | PREFILLED_SYRINGE | Freq: Once | SUBCUTANEOUS | Status: AC
  Administered 2023-07-30: 210 mg via SUBCUTANEOUS

## 2023-07-30 NOTE — Progress Notes (Cosign Needed Addendum)
Pt received Evenity, 1.17 mL, SQ, bilateral upper arms, pt tolerated well.    Visit notes and NUR visit notes sent to PCP, Dr. Hillard Danker via [facility] (per pt request), FAX # [phone].

## 2023-07-31 DIAGNOSIS — Z85820 Personal history of malignant melanoma of skin: Secondary | ICD-10-CM | POA: Diagnosis not present

## 2023-07-31 DIAGNOSIS — D1801 Hemangioma of skin and subcutaneous tissue: Secondary | ICD-10-CM | POA: Diagnosis not present

## 2023-07-31 DIAGNOSIS — D225 Melanocytic nevi of trunk: Secondary | ICD-10-CM | POA: Diagnosis not present

## 2023-07-31 DIAGNOSIS — D2262 Melanocytic nevi of left upper limb, including shoulder: Secondary | ICD-10-CM | POA: Diagnosis not present

## 2023-07-31 DIAGNOSIS — Z85828 Personal history of other malignant neoplasm of skin: Secondary | ICD-10-CM | POA: Diagnosis not present

## 2023-07-31 DIAGNOSIS — L821 Other seborrheic keratosis: Secondary | ICD-10-CM | POA: Diagnosis not present

## 2023-07-31 DIAGNOSIS — L723 Sebaceous cyst: Secondary | ICD-10-CM | POA: Diagnosis not present

## 2023-08-06 ENCOUNTER — Telehealth: Payer: Self-pay | Admitting: Family Medicine

## 2023-08-06 NOTE — Telephone Encounter (Signed)
Patient's husband called asking to speak to [place]. He said that they have some follow up questions from their last visit when they were here for the Evenity injection and mentioned getting information to Dr Margaretann Loveless at [facility]?  Would you be able to call the patient and her husband in regards?  Thank you!

## 2023-08-07 NOTE — Telephone Encounter (Signed)
OV and NUR visit notes routed to PCP via [facility].

## 2023-08-07 NOTE — Telephone Encounter (Signed)
Called pt and left VM to call the office.  

## 2023-08-08 NOTE — Telephone Encounter (Signed)
Called pt and left VM for pt to call the office.

## 2023-08-17 NOTE — Telephone Encounter (Signed)
Unable to reach pt by phone, but visit notes were sent.

## 2023-08-23 DIAGNOSIS — H4051X3 Glaucoma secondary to other eye disorders, right eye, severe stage: Secondary | ICD-10-CM | POA: Diagnosis not present

## 2023-08-24 ENCOUNTER — Emergency Department (HOSPITAL_BASED_OUTPATIENT_CLINIC_OR_DEPARTMENT_OTHER): Payer: Medicare Other

## 2023-08-24 ENCOUNTER — Other Ambulatory Visit: Payer: Self-pay

## 2023-08-24 ENCOUNTER — Emergency Department (HOSPITAL_BASED_OUTPATIENT_CLINIC_OR_DEPARTMENT_OTHER)
Admission: EM | Admit: 2023-08-24 | Discharge: 2023-08-24 | Disposition: A | Discharge status: Home / Self Care | Payer: Medicare Other | Attending: Emergency Medicine | Admitting: Emergency Medicine

## 2023-08-24 DIAGNOSIS — Z7982 Long term (current) use of aspirin: Secondary | ICD-10-CM | POA: Insufficient documentation

## 2023-08-24 DIAGNOSIS — R413 Other amnesia: Secondary | ICD-10-CM | POA: Insufficient documentation

## 2023-08-24 DIAGNOSIS — E871 Hypo-osmolality and hyponatremia: Secondary | ICD-10-CM | POA: Insufficient documentation

## 2023-08-24 DIAGNOSIS — R824 Acetonuria: Secondary | ICD-10-CM | POA: Diagnosis not present

## 2023-08-24 DIAGNOSIS — I2510 Atherosclerotic heart disease of native coronary artery without angina pectoris: Secondary | ICD-10-CM | POA: Insufficient documentation

## 2023-08-24 DIAGNOSIS — R4182 Altered mental status, unspecified: Secondary | ICD-10-CM | POA: Diagnosis not present

## 2023-08-24 DIAGNOSIS — R9431 Abnormal electrocardiogram [ECG] [EKG]: Secondary | ICD-10-CM | POA: Diagnosis not present

## 2023-08-24 LAB — URINALYSIS, ROUTINE W REFLEX MICROSCOPIC
Bacteria, UA: NONE SEEN
Bilirubin Urine: NEGATIVE
Bilirubin Urine: NEGATIVE
Glucose, UA: NEGATIVE mg/dL
Glucose, UA: NEGATIVE mg/dL
Ketones, ur: 15 mg/dL — AB
Ketones, ur: 15 mg/dL — AB
Nitrite: NEGATIVE
Nitrite: NEGATIVE
Protein, ur: NEGATIVE mg/dL
Protein, ur: NEGATIVE mg/dL
Specific Gravity, Urine: 1.011 (ref 1.005–1.030)
Specific Gravity, Urine: 1.011 (ref 1.005–1.030)
pH: 6 (ref 5.0–8.0)
pH: 6 (ref 5.0–8.0)

## 2023-08-24 LAB — CBC
HCT: 42.2 % (ref 36.0–46.0)
Hemoglobin: 14.6 g/dL (ref 12.0–15.0)
MCH: 29.9 pg (ref 26.0–34.0)
MCHC: 34.6 g/dL (ref 30.0–36.0)
MCV: 86.3 fL (ref 80.0–100.0)
Platelets: 334 10*3/uL (ref 150–400)
RBC: 4.89 MIL/uL (ref 3.87–5.11)
RDW: 13.5 % (ref 11.5–15.5)
WBC: 7.5 10*3/uL (ref 4.0–10.5)
nRBC: 0 % (ref 0.0–0.2)

## 2023-08-24 LAB — CBG MONITORING, ED: Glucose-Capillary: 110 mg/dL — ABNORMAL HIGH (ref 70–99)

## 2023-08-24 LAB — BASIC METABOLIC PANEL
Anion gap: 10 (ref 5–15)
BUN: 11 mg/dL (ref 8–23)
CO2: 22 mmol/L (ref 22–32)
Calcium: 8.8 mg/dL — ABNORMAL LOW (ref 8.9–10.3)
Chloride: 94 mmol/L — ABNORMAL LOW (ref 98–111)
Creatinine, Ser: 0.53 mg/dL (ref 0.44–1.00)
GFR, Estimated: >60 mL/min (ref >60)
Glucose, Bld: 115 mg/dL — ABNORMAL HIGH (ref 70–99)
Potassium: 3.8 mmol/L (ref 3.5–5.1)
Sodium: 126 mmol/L — ABNORMAL LOW (ref 135–145)

## 2023-08-24 MED ORDER — SODIUM CHLORIDE 0.9 % IV BOLUS
1000.0000 mL | Freq: Once | INTRAVENOUS | Status: AC
  Administered 2023-08-24: 1000 mL via INTRAVENOUS

## 2023-08-24 NOTE — Discharge Instructions (Signed)
You were seen in the emergency department for your memory loss.  Your workup showed that your sodium levels were mildly low but no signs of infection or stroke.  We gave you fluids in the emergency department and you should make sure that you are drinking plenty of fluids and staying well-hydrated.  You can follow-up with your primary doctor to have your symptoms and your sodium levels rechecked.  I have given you a referral to neurology so you can have further workup of your memory loss and evaluation for possible dementia.  You should return to the emergency department if you are having increasing confusion, you are drowsy and hard to wake up, you have a seizure or if you have any other new or concerning symptoms.

## 2023-08-24 NOTE — ED Provider Notes (Signed)
[place] EMERGENCY DEPARTMENT AT [facility] Provider Note   CSN: [id number] Arrival date & time: 08/24/23  1629     History  Chief Complaint  Patient presents with   Memory Loss    Chronic    Julia [name] is a 87 y.o. female.  Patient is an 87 year old female with a past medical history of CAD and GERD presenting to the emergency department with memory loss.  The patient is here with her son-in-law and they state that she has been having problems with her memory.  Her son-in-law states that he is visiting her today for the first time in a few months however has had significant change.  He states that she went to the eye doctor yesterday and did not remember doing that today and kept repetitively asking him where her phone was today when he told her it was in her purse.  He states that her husband reports that he has maybe noticed some forgetfulness over the last few days but is unsure when exactly it has started.  The patient states that she does feels like she is having problems remembering things.  She denies any headaches but states that she has had some dizziness.  She denies any fevers or chills, nausea, vomiting or diarrhea.  She denies any recent medication changes.  She denies any numbness or weakness in her arms or legs.  She denies any dysuria or hematuria.  The history is provided by the patient and a relative.       Home Medications Prior to Admission medications   Medication Sig Start Date End Date Taking? Authorizing Provider  acetaminophen (TYLENOL) 500 MG tablet Take 500 mg by mouth every 6 (six) hours as needed for moderate pain or headache.    [provider]  aspirin 81 MG chewable tablet Chew 81 mg by mouth daily.    [provider]  carboxymethylcellulose (REFRESH PLUS) 0.5 % SOLN Place 1 drop into both eyes 3 (three) times daily as needed (dry eyes).    [provider]  Cholecalciferol (VITAMIN D3) 2000 UNITS capsule Take  2,000 Units by mouth daily.    [provider]  COLLAGEN PO Take 5 mLs by mouth daily.    [provider]  ezetimibe (ZETIA) 10 MG tablet TAKE 1 TABLET(10 MG) BY MOUTH DAILY 06/26/23   Tereso Newcomer T, PA-C  famotidine (PEPCID) 10 MG tablet Take 10 mg by mouth 2 (two) times daily.    [provider]  latanoprost (XALATAN) 0.005 % ophthalmic solution Apply to eye. 03/21/23   [provider]  Multiple Vitamin (MULTIVITAMIN) tablet Take 1 tablet by mouth daily.    [provider]  nitroGLYCERIN (NITROSTAT) 0.4 MG SL tablet Place 0.4 mg under the tongue every 5 (five) minutes as needed for chest pain. 07/05/22   [provider]  PREVIDENT 5000 BOOSTER PLUS 1.1 % PSTE Place 1 application onto teeth at bedtime. 03/17/21   [provider]  Probiotic Product (PROBIOTIC PO) Take 1 capsule by mouth daily.    [provider]  Romosozumab-aqqg (EVENITY) 105 MG/1. SOSY injection  02/21/23   [provider]  Sodium Chloride-Xylitol (XLEAR SINUS CARE SPRAY NA) Place 1 spray into the nose daily as needed (sinus congestion).    [provider]  THEANINE PO Take 25-100 mg by mouth daily as needed (sleep & stress).    [provider]      Allergies    Pravastatin and Trazodone hcl  Review of Systems   Review of Systems  Physical Exam Updated Vital Signs BP 131/76 (BP Location: Right Arm)   Pulse 74   Temp 98 F (36.7 C) (Oral)   Resp 16   SpO2 97%  Physical Exam Vitals and nursing note reviewed.  Constitutional:      General: She is not in acute distress.    Appearance: Normal appearance.  HENT:     Head: Normocephalic and atraumatic.     Nose: Nose normal.     Mouth/Throat:     Mouth: Mucous membranes are moist.     Pharynx: Oropharynx is clear.  Eyes:     Extraocular Movements: Extraocular movements intact.     Conjunctiva/sclera: Conjunctivae normal.     Pupils: Pupils are equal, round, and  reactive to light.  Cardiovascular:     Rate and Rhythm: Normal rate and regular rhythm.     Pulses: Normal pulses.     Heart sounds: Normal heart sounds.  Pulmonary:     Effort: Pulmonary effort is normal.     Breath sounds: Normal breath sounds.  Abdominal:     General: Abdomen is flat.     Palpations: Abdomen is soft.     Tenderness: There is no abdominal tenderness.  Musculoskeletal:        General: Normal range of motion.     Cervical back: Normal range of motion.  Skin:    General: Skin is warm and dry.  Neurological:     General: No focal deficit present.     Mental Status: She is alert and oriented to person, place, and time.     Cranial Nerves: No cranial nerve deficit.     Sensory: No sensory deficit.     Motor: No weakness.     Coordination: Coordination normal.  Psychiatric:        Mood and Affect: Mood normal.        Behavior: Behavior normal.     ED Results / Procedures / Treatments   Labs (all labs ordered are listed, but only abnormal results are displayed) Labs Reviewed  BASIC METABOLIC PANEL - Abnormal; Notable for the following components:      Result Value   Sodium 126 (*)    Chloride 94 (*)    Glucose, Bld 115 (*)    Calcium 8.8 (*)    All other components within normal limits  URINALYSIS, ROUTINE W REFLEX MICROSCOPIC - Abnormal; Notable for the following components:   Hgb urine dipstick SMALL (*)    Ketones, ur 15 (*)    Leukocytes,Ua TRACE (*)    All other components within normal limits  URINALYSIS, ROUTINE W REFLEX MICROSCOPIC - Abnormal; Notable for the following components:   Hgb urine dipstick SMALL (*)    Ketones, ur 15 (*)    Leukocytes,Ua TRACE (*)    All other components within normal limits  CBG MONITORING, ED - Abnormal; Notable for the following components:   Glucose-Capillary 110 (*)    All other components within normal limits  CBC    EKG EKG Interpretation Date/Time:  Friday August 24 2023 16:51:12 EDT Ventricular  Rate:  105 PR Interval:    QRS Duration:  120 QT Interval:  370 QTC Calculation: 489 R Axis:   -67  Text Interpretation: Normal sinus rhythm  Left axis deviation  Left bundle branch block  Minimal voltage criteria for LVH, may be normal variant ( Cornell product )  Anteroseptal infarct , age undetermined  Abnormal  ECG  No significant change from last EKG          Confirmed by Elayne Snare ([id number]) on 08/24/2023 5:52:49 PM  Radiology CT Head Wo Contrast  Result Date: 08/24/2023 CLINICAL DATA:  Mental status change, unknown cause EXAM: CT HEAD WITHOUT CONTRAST TECHNIQUE: Contiguous axial images were obtained from the base of the skull through the vertex without intravenous contrast. RADIATION DOSE REDUCTION: This exam was performed according to the departmental dose-optimization program which includes automated exposure control, adjustment of the mA and/or kV according to patient size and/or use of iterative reconstruction technique. COMPARISON:  None Available. FINDINGS: Brain: Normal anatomic configuration. Parenchymal volume loss is commensurate with the patient's age. Mild periventricular white matter changes are present likely reflecting the sequela of small vessel ischemia. No abnormal intra or extra-axial mass lesion or fluid collection. No abnormal mass effect or midline shift. No evidence of acute intracranial hemorrhage or infarct. Ventricular size is normal. Cerebellum unremarkable. Vascular: No asymmetric hyperdense vasculature at the skull base. Skull: Intact Sinuses/Orbits: Paranasal sinuses are clear. Orbits are unremarkable. Other: Mastoid air cells and middle ear cavities are clear. IMPRESSION: 1. No acute intracranial hemorrhage or infarct. 2. Mild senescent change. Electronically Signed   By: Helyn Numbers M.D.   On: 08/24/2023 20:55    Procedures Procedures    Medications Ordered in ED Medications  sodium chloride 0.9 % bolus 1,000 mL (0 mLs Intravenous Stopped 08/24/23  2116)    ED Course/ Medical Decision Making/ A&P Clinical Course as of 08/24/23 2233  Fri Aug 24, 2023  2027 Few ketones in the urine, no signs of infection. [VK]  2112 No acute disease on head CT scan.  Concern for possible new onset dementia.  Will be given outpatient neurology referral and will be recommended for primary care follow-up for sodium recheck. [VK]    Clinical Course User Index [VK] Rexford Maus, DO                                 Medical Decision Making This patient presents to the ED with chief complaint(s) of memory loss with pertinent past medical history of CAD, GERD which further complicates the presenting complaint. The complaint involves an extensive differential diagnosis and also carries with it a high risk of complications and morbidity.    The differential diagnosis includes dementia, ICH, mass effect, no focal neurologic deficits making CVA unlikely, dehydration, electrolyte abnormality, infection  Additional history obtained: Additional history obtained from family Records reviewed outpatient sports medicine and neurology records  ED Course and Reassessment: On patient's arrival to the emergency department she was hemodynamically stable in no acute distress.  She is oriented to person place, time and president and has no focal neurologic deficits on exam.  She appears to have more problems with short-term memory.  No reported history of any trauma.  She had labs, EKG and urine performed in triage.  EKG showed normal sinus rhythm without acute ischemic changes.  Labs showed mild hyponatremia with a sodium of 126.  I do not suspect this is a large enough drop to cause her memory issues and would expect more confusion however will be given normal saline to replete her sodium in the setting of her dizziness and concern for mild dehydration.  She will additionally have head CT performed to evaluate for ICH or mass effect and she will be closely  reassessed.  Independent labs interpretation:  The following labs were independently interpreted: Hyponatremia, ketonuria  Independent visualization of imaging: - I independently visualized the following imaging with scope of interpretation limited to determining acute life threatening conditions related to emergency care: CT head, which revealed no acute disease  Consultation: - Consulted or discussed management/test interpretation w/ external professional: N/A  Consideration for admission or further workup: Patient has no emergent conditions requiring admission or further work-up at this time and is stable for discharge home with primary care follow-up  Social Determinants of health: N/A    Amount and/or Complexity of Data Reviewed Labs: ordered. Radiology: ordered.          Final Clinical Impression(s) / ED Diagnoses Final diagnoses:  Memory loss  Hyponatremia    Rx / DC Orders ED Discharge Orders          Ordered    Ambulatory referral to Neurology       Comments: An appointment is requested in approximately: 4 weeks   08/24/23 2232              Rexford Maus, DO 08/24/23 2234

## 2023-08-24 NOTE — ED Triage Notes (Addendum)
Family reports increased confusion x1 month- forgetful at time. No deficits observed in triage. NIH0. Pt and family cannot pin point acute change in past 24 hrs-though seems to have worsened at increased pace in last week. Denies CP, SOB- endorses chest tightness with some anxiety. Denies N/V/D. Denies urinary changes or hx uti.

## 2023-08-24 NOTE — ED Notes (Signed)
Pt amb to the bathroom with stand by assist.

## 2023-08-26 ENCOUNTER — Encounter: Payer: Self-pay | Admitting: Family Medicine

## 2023-08-30 ENCOUNTER — Ambulatory Visit: Payer: Medicare Other

## 2023-09-04 DIAGNOSIS — R4189 Other symptoms and signs involving cognitive functions and awareness: Secondary | ICD-10-CM | POA: Diagnosis not present

## 2023-09-04 DIAGNOSIS — R42 Dizziness and giddiness: Secondary | ICD-10-CM | POA: Diagnosis not present

## 2023-09-04 DIAGNOSIS — E871 Hypo-osmolality and hyponatremia: Secondary | ICD-10-CM | POA: Diagnosis not present

## 2023-09-04 DIAGNOSIS — E46 Unspecified protein-calorie malnutrition: Secondary | ICD-10-CM | POA: Diagnosis not present

## 2023-09-05 ENCOUNTER — Other Ambulatory Visit: Payer: Self-pay | Admitting: Internal Medicine

## 2023-09-05 ENCOUNTER — Encounter: Payer: Self-pay | Admitting: Physician Assistant

## 2023-09-05 DIAGNOSIS — R419 Unspecified symptoms and signs involving cognitive functions and awareness: Secondary | ICD-10-CM

## 2023-09-07 DIAGNOSIS — Z7982 Long term (current) use of aspirin: Secondary | ICD-10-CM | POA: Diagnosis not present

## 2023-09-07 DIAGNOSIS — M810 Age-related osteoporosis without current pathological fracture: Secondary | ICD-10-CM | POA: Diagnosis not present

## 2023-09-07 DIAGNOSIS — Z85828 Personal history of other malignant neoplasm of skin: Secondary | ICD-10-CM | POA: Diagnosis not present

## 2023-09-07 DIAGNOSIS — E46 Unspecified protein-calorie malnutrition: Secondary | ICD-10-CM | POA: Diagnosis not present

## 2023-09-07 DIAGNOSIS — R42 Dizziness and giddiness: Secondary | ICD-10-CM | POA: Diagnosis not present

## 2023-09-07 DIAGNOSIS — E871 Hypo-osmolality and hyponatremia: Secondary | ICD-10-CM | POA: Diagnosis not present

## 2023-09-14 DIAGNOSIS — Z85828 Personal history of other malignant neoplasm of skin: Secondary | ICD-10-CM | POA: Diagnosis not present

## 2023-09-14 DIAGNOSIS — E46 Unspecified protein-calorie malnutrition: Secondary | ICD-10-CM | POA: Diagnosis not present

## 2023-09-14 DIAGNOSIS — R42 Dizziness and giddiness: Secondary | ICD-10-CM | POA: Diagnosis not present

## 2023-09-14 DIAGNOSIS — M810 Age-related osteoporosis without current pathological fracture: Secondary | ICD-10-CM | POA: Diagnosis not present

## 2023-09-14 DIAGNOSIS — E871 Hypo-osmolality and hyponatremia: Secondary | ICD-10-CM | POA: Diagnosis not present

## 2023-09-14 DIAGNOSIS — Z7982 Long term (current) use of aspirin: Secondary | ICD-10-CM | POA: Diagnosis not present

## 2023-09-16 ENCOUNTER — Ambulatory Visit
Admission: RE | Admit: 2023-09-16 | Discharge: 2023-09-16 | Disposition: A | Discharge status: Home / Self Care | Payer: Medicare Other | Source: Ambulatory Visit | Attending: Internal Medicine | Admitting: Internal Medicine

## 2023-09-16 DIAGNOSIS — R413 Other amnesia: Secondary | ICD-10-CM | POA: Diagnosis not present

## 2023-09-16 DIAGNOSIS — R419 Unspecified symptoms and signs involving cognitive functions and awareness: Secondary | ICD-10-CM

## 2023-09-21 ENCOUNTER — Other Ambulatory Visit: Payer: Self-pay | Admitting: Internal Medicine

## 2023-09-21 DIAGNOSIS — Z1231 Encounter for screening mammogram for malignant neoplasm of breast: Secondary | ICD-10-CM

## 2023-09-21 DIAGNOSIS — Z85828 Personal history of other malignant neoplasm of skin: Secondary | ICD-10-CM | POA: Diagnosis not present

## 2023-09-21 DIAGNOSIS — E871 Hypo-osmolality and hyponatremia: Secondary | ICD-10-CM | POA: Diagnosis not present

## 2023-09-21 DIAGNOSIS — Z7982 Long term (current) use of aspirin: Secondary | ICD-10-CM | POA: Diagnosis not present

## 2023-09-21 DIAGNOSIS — M810 Age-related osteoporosis without current pathological fracture: Secondary | ICD-10-CM | POA: Diagnosis not present

## 2023-09-21 DIAGNOSIS — R42 Dizziness and giddiness: Secondary | ICD-10-CM | POA: Diagnosis not present

## 2023-09-21 DIAGNOSIS — E46 Unspecified protein-calorie malnutrition: Secondary | ICD-10-CM | POA: Diagnosis not present

## 2023-09-25 DIAGNOSIS — Z85828 Personal history of other malignant neoplasm of skin: Secondary | ICD-10-CM | POA: Diagnosis not present

## 2023-09-25 DIAGNOSIS — M810 Age-related osteoporosis without current pathological fracture: Secondary | ICD-10-CM | POA: Diagnosis not present

## 2023-09-25 DIAGNOSIS — E871 Hypo-osmolality and hyponatremia: Secondary | ICD-10-CM | POA: Diagnosis not present

## 2023-09-25 DIAGNOSIS — R42 Dizziness and giddiness: Secondary | ICD-10-CM | POA: Diagnosis not present

## 2023-09-25 DIAGNOSIS — E46 Unspecified protein-calorie malnutrition: Secondary | ICD-10-CM | POA: Diagnosis not present

## 2023-09-25 DIAGNOSIS — Z7982 Long term (current) use of aspirin: Secondary | ICD-10-CM | POA: Diagnosis not present

## 2023-10-01 DIAGNOSIS — E46 Unspecified protein-calorie malnutrition: Secondary | ICD-10-CM | POA: Diagnosis not present

## 2023-10-01 DIAGNOSIS — R1310 Dysphagia, unspecified: Secondary | ICD-10-CM | POA: Diagnosis not present

## 2023-10-01 DIAGNOSIS — R4189 Other symptoms and signs involving cognitive functions and awareness: Secondary | ICD-10-CM | POA: Diagnosis not present

## 2023-10-01 DIAGNOSIS — R42 Dizziness and giddiness: Secondary | ICD-10-CM | POA: Diagnosis not present

## 2023-10-04 ENCOUNTER — Ambulatory Visit: Payer: Medicare Other | Admitting: Physician Assistant

## 2023-10-04 ENCOUNTER — Encounter: Payer: Self-pay | Admitting: Physician Assistant

## 2023-10-04 VITALS — BP 159/95 | HR 86 | Resp 20 | Ht 66.0 in

## 2023-10-04 DIAGNOSIS — R413 Other amnesia: Secondary | ICD-10-CM | POA: Insufficient documentation

## 2023-10-04 NOTE — Patient Instructions (Addendum)
It was a pleasure to see you today at our office.   Recommendations:  Neurocognitive evaluation at our office   Follow up in June 5 , 11:30 am  Please address anxiety with your doctor  Please go over with your doctor about the OTC    For psychiatric meds, mood meds: Please have your primary care physician manage these medications.  If you have any severe symptoms of a stroke, or other severe issues such as confusion,severe chills or fever, etc call 911 or go to the ER as you may need to be evaluated further    For assessment of decision of mental capacity and competency:  Call Dr. Erick Blinks, geriatric psychiatrist at [phone]  Counseling regarding caregiver distress, including caregiver depression, anxiety and issues regarding community resources, adult day care programs, adult living facilities, or memory care questions:  please contact your  Primary Doctor's Social Worker   Whom to call: Memory  decline, memory medications: Call our office [phone]    [email or web address]   RECOMMENDATIONS FOR ALL PATIENTS WITH MEMORY PROBLEMS: 1. Continue to exercise (Recommend 30 minutes of walking everyday, or 3 hours every week) 2. Increase social interactions - continue going to [place] and enjoy social gatherings with friends and family 3. Eat healthy, avoid fried foods and eat more fruits and vegetables 4. Maintain adequate blood pressure, blood sugar, and blood cholesterol level. Reducing the risk of stroke and cardiovascular disease also helps promoting better memory. 5. Avoid stressful situations. Live a simple life and avoid aggravations. Organize your time and prepare for the next day in anticipation. 6. Sleep well, avoid any interruptions of sleep and avoid any distractions in the bedroom that may interfere with adequate sleep quality 7. Avoid sugar, avoid sweets as there is a strong link between excessive sugar intake,  diabetes, and cognitive impairment We discussed the Mediterranean diet, which has been shown to help patients reduce the risk of progressive memory disorders and reduces cardiovascular risk. This includes eating fish, eat fruits and green leafy vegetables, nuts like almonds and hazelnuts, walnuts, and also use olive oil. Avoid fast foods and fried foods as much as possible. Avoid sweets and sugar as sugar use has been linked to worsening of memory function.  There is always a concern of gradual progression of memory problems. If this is the case, then we may need to adjust level of care according to patient needs. Support, both to the patient and caregiver, should then be put into place.      DRIVING: Regarding driving, in patients with progressive memory problems, driving will be impaired. We advise to have someone else do the driving if trouble finding directions or if minor accidents are reported. Independent driving assessment is available to determine safety of driving.   If you are interested in the driving assessment, you can contact the following:  [facility] [employer] in [place] [phone]  [facility] [phone]  [facility] [phone]  [facility] [phone] or [phone]   FALL PRECAUTIONS: Be cautious when walking. Scan the area for obstacles that may increase the risk of trips and falls. When getting up in the mornings, sit up at the edge of the bed for a few minutes before getting out of bed. Consider elevating the bed at the head end to avoid drop of blood pressure when getting up. Walk always in a well-lit room (use night lights in the walls). Avoid area rugs or power cords from appliances in the middle of the  walkways. Use a walker or a cane if necessary and consider physical therapy for balance exercise. Get your eyesight checked regularly.  FINANCIAL OVERSIGHT: Supervision, especially oversight when making financial  decisions or transactions is also recommended.  HOME SAFETY: Consider the safety of the kitchen when operating appliances like stoves, microwave oven, and blender. Consider having supervision and share cooking responsibilities until no longer able to participate in those. Accidents with firearms and other hazards in the house should be identified and addressed as well.   ABILITY TO BE LEFT ALONE: If patient is unable to contact 911 operator, consider using LifeLine, or when the need is there, arrange for someone to stay with patients. Smoking is a fire hazard, consider supervision or cessation. Risk of wandering should be assessed by caregiver and if detected at any point, supervision and safe proof recommendations should be instituted.  MEDICATION SUPERVISION: Inability to self-administer medication needs to be constantly addressed. Implement a mechanism to ensure safe administration of the medications.      Mediterranean Diet A Mediterranean diet refers to food and lifestyle choices that are based on the traditions of countries located on the [employer]. This way of eating has been shown to help prevent certain conditions and improve outcomes for people who have chronic diseases, like kidney disease and heart disease. What are tips for following this plan? Lifestyle  Cook and eat meals together with your family, when possible. Drink enough fluid to keep your urine clear or pale yellow. Be physically active every day. This includes: Aerobic exercise like running or swimming. Leisure activities like gardening, walking, or housework. Get 7-8 hours of sleep each night. If recommended by your health care provider, drink red wine in moderation. This means 1 glass a day for nonpregnant women and 2 glasses a day for men. A glass of wine equals 5 oz (150 mL). Reading food labels  Check the serving size of packaged foods. For foods such as rice and pasta, the serving size refers to the amount  of cooked product, not dry. Check the total fat in packaged foods. Avoid foods that have saturated fat or trans fats. Check the ingredients list for added sugars, such as corn syrup. Shopping  At the grocery store, buy most of your food from the areas near the walls of the store. This includes: Fresh fruits and vegetables (produce). Grains, beans, nuts, and seeds. Some of these may be available in unpackaged forms or large amounts (in bulk). Fresh seafood. Poultry and eggs. Low-fat dairy products. Buy whole ingredients instead of prepackaged foods. Buy fresh fruits and vegetables in-season from local farmers markets. Buy frozen fruits and vegetables in resealable bags. If you do not have access to quality fresh seafood, buy precooked frozen shrimp or canned fish, such as tuna, salmon, or sardines. Buy small amounts of raw or cooked vegetables, salads, or olives from the deli or salad bar at your store. Stock your pantry so you always have certain foods on hand, such as olive oil, canned tuna, canned tomatoes, rice, pasta, and beans. Cooking  Cook foods with extra-virgin olive oil instead of using butter or other vegetable oils. Have meat as a side dish, and have vegetables or grains as your main dish. This means having meat in small portions or adding small amounts of meat to foods like pasta or stew. Use beans or vegetables instead of meat in common dishes like chili or lasagna. Experiment with different cooking methods. Try roasting or broiling vegetables instead of steaming  or sauteing them. Add frozen vegetables to soups, stews, pasta, or rice. Add nuts or seeds for added healthy fat at each meal. You can add these to yogurt, salads, or vegetable dishes. Marinate fish or vegetables using olive oil, lemon juice, garlic, and fresh herbs. Meal planning  Plan to eat 1 vegetarian meal one day each week. Try to work up to 2 vegetarian meals, if possible. Eat seafood 2 or more times a  week. Have healthy snacks readily available, such as: Vegetable sticks with hummus. Greek yogurt. Fruit and nut trail mix. Eat balanced meals throughout the week. This includes: Fruit: 2-3 servings a day Vegetables: 4-5 servings a day Low-fat dairy: 2 servings a day Fish, poultry, or lean meat: 1 serving a day Beans and legumes: 2 or more servings a week Nuts and seeds: 1-2 servings a day Whole grains: 6-8 servings a day Extra-virgin olive oil: 3-4 servings a day Limit red meat and sweets to only a few servings a month What are my food choices? Mediterranean diet Recommended Grains: Whole-grain pasta. Brown rice. Bulgar wheat. Polenta. Couscous. Whole-wheat bread. Orpah Cobb. Vegetables: Artichokes. Beets. Broccoli. Cabbage. Carrots. Eggplant. Green beans. Chard. Kale. Spinach. Onions. Leeks. Peas. Squash. Tomatoes. Peppers. Radishes. Fruits: Apples. Apricots. Avocado. Berries. Bananas. Cherries. Dates. Figs. Grapes. Lemons. Melon. Oranges. Peaches. Plums. Pomegranate. Meats and other protein foods: Beans. Almonds. Sunflower seeds. Pine nuts. Peanuts. Cod. Salmon. Scallops. Shrimp. Tuna. Tilapia. Clams. Oysters. Eggs. Dairy: Low-fat milk. Cheese. Greek yogurt. Beverages: Water. Red wine. Herbal tea. Fats and oils: Extra virgin olive oil. Avocado oil. Grape seed oil. Sweets and desserts: [place] yogurt with honey. Baked apples. Poached pears. Trail mix. Seasoning and other foods: Basil. Cilantro. Coriander. Cumin. Mint. Parsley. Sage. Rosemary. Tarragon. Garlic. Oregano. Thyme. Pepper. Balsalmic vinegar. Tahini. Hummus. Tomato sauce. Olives. Mushrooms. Limit these Grains: Prepackaged pasta or rice dishes. Prepackaged cereal with added sugar. Vegetables: Deep fried potatoes (french fries). Fruits: Fruit canned in syrup. Meats and other protein foods: Beef. Pork. Lamb. Poultry with skin. Hot dogs. Tomasa Blase. Dairy: Ice cream. Sour cream. Whole milk. Beverages: Juice. Sugar-sweetened soft  drinks. Beer. Liquor and spirits. Fats and oils: Butter. Canola oil. Vegetable oil. Beef fat (tallow). Lard. Sweets and desserts: Cookies. Cakes. Pies. Candy. Seasoning and other foods: Mayonnaise. Premade sauces and marinades. The items listed may not be a complete list. Talk with your dietitian about what dietary choices are right for you. Summary The Mediterranean diet includes both food and lifestyle choices. Eat a variety of fresh fruits and vegetables, beans, nuts, seeds, and whole grains. Limit the amount of red meat and sweets that you eat. Talk with your health care provider about whether it is safe for you to drink red wine in moderation. This means 1 glass a day for nonpregnant women and 2 glasses a day for men. A glass of wine equals 5 oz (150 mL). This information is not intended to replace advice given to you by your health care provider. Make sure you discuss any questions you have with your health care provider. Document Released: 06/08/2016 Document Revised: 07/11/2016 Document Reviewed: 06/08/2016 Elsevier Interactive Patient Education  2017 [employer].

## 2023-10-04 NOTE — Progress Notes (Signed)
Assessment/Plan:     Julia [name] is a delightful  87 y.o. 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  She had been evaluated at the ED on 08/24/2023 with increased confusion felt to be due to dementia medication, the time her sodium was 126 and she had a UTI as well. MRI brain 09/16/23 personally reviewed remarkable for chronic microvascular changes without acute findings and mild  atrophy . MoCA today is 18/30.  Of note, the patient takes multiple over-the-counter medications as well, it is unclear if this contributes to some of her acute memory issues.  Recommend going over all these supplements with PCP to determine if they are absolutely necessary to take. In addition, needs to address her anxiety with PCP, discussed psychotherapy for excessive worry.    Memory Impairment   Recommend good control of cardiovascular risk factors.  Continue baby aspirin daily. Patient informed of elevated BP Continue to control mood as per PCP Neuropsych testing for clarity of diagnosis and contribution of other causes including anxiety and depression  Recommend decreasing the amount of OTC meds, as the SE may be unknown. Will consider antidementia medication after discontinuation of some of her OTCs.  Recommend psychotherapy for anxiety and possibly underlying depression  Folllow up in 6 months   Subjective:    The patient is accompanied by her husband and son who supplements the history.    How long did patient have memory difficulties?  She is not sure when these started. Son reports that it may have been over the last year, may be worse over the last 2 months. Patient reports some difficulty remembering new information, recent conversations, names. She is very anxious and worry  about this issue. repeats oneself?  Endorsed, asking for what objects are such as the phone, and asking about appointments Disoriented when walking into a room?  Patient denies.    Leaving objects in unusual places?  Misplaces papers and files.   Wandering behavior? Denies.   Any personality changes, or depression, anxiety? Endorsed  "Lots of anxiety, I am scared to death"   She is concerned about what her Church friends, "what do I tell them, what do I have?".  Hallucinations or paranoia? denies   Seizures? Denies.    Any sleep changes? Sleeps well. Denies vivid dreams, REM behavior or sleepwalking   Sleep apnea? Denies.   Any hygiene concerns?  Denies.   Independent of bathing and dressing? Endorsed  Does the patient need help with medications?  Patient is in charge. She may be missing doses.    Who is in charge of the finances?  Husband is in charge . Difficulty comprehending financial issues that she had done before without issues.  Any changes in appetite?  Appetite is decreased, has dropped significant amount of weight. "Just not hungry". Does not drink enough water.     Patient have trouble swallowing?  Denies.   Does the patient cook? No  Any headaches?  Denies.   Chronic pain?  She has chronic back pain followed by orthopedic. She had been taking injections, placed them on hold for a while because concerns for side effects .  Ambulates with difficulty? Denies   Recent falls or head injuries? Denies.     Vision changes?  Denies any new issues  Any strokelike symptoms? Denies.  She has chronic bilateral paresthesias, with most recent EMG /NCS on September 2024 yielding normal results, negative for neuropathy Any tremors? Denies.   Any anosmia? Denies.   Any incontinence of urine? Stress incontinence, uses pads  Any bowel dysfunction? Chronic constipation       Patient lives with her husband.  History of heavy alcohol intake? Denies.   History of heavy tobacco use? Denies.    Family history of dementia?   Mother had dementia in the early 67s, PGM AD  Does patient drive? Yes, denies getting lost   Most recent labs sodium 134, B12 1384, TSH 1.43 Retired [employer] pilot    Allergies  Allergen Reactions   Pravastatin Other (See Comments)    It made me "looney tunes"   Trazodone Hcl Other (See Comments)    Per patient she just doesn't feel right     Pravastatin Sodium     Other Reaction(s): not feel right   Rosuvastatin Other (See Comments)    Current Outpatient Medications  Medication Instructions   acetaminophen (TYLENOL) 500 mg, Oral, Every 6 hours PRN   aspirin 81 mg, Oral, Daily   carboxymethylcellulose (REFRESH PLUS) 0.5 % SOLN 1 drop, Both Eyes, 3 times daily PRN   COLLAGEN PO 5 mLs, Oral, Daily   ezetimibe (ZETIA) 10 MG tablet TAKE 1 TABLET(10 MG) BY MOUTH DAILY   famotidine (PEPCID) 10 mg, Oral, 2 times daily   latanoprost (XALATAN) 0.005 % ophthalmic solution Ophthalmic   Multiple Vitamin (MULTIVITAMIN) tablet 1 tablet, Oral, Daily,     nitroGLYCERIN (NITROSTAT) 0.4 mg, Sublingual, Every 5 min PRN   PREVIDENT 5000 BOOSTER PLUS 1.1 % PSTE 1 application , dental, Daily at bedtime   Probiotic Product (PROBIOTIC PO) 1 capsule, Oral, Daily   Romosozumab-aqqg (EVENITY) 105 MG/1. SOSY injection    Sodium Chloride-Xylitol (XLEAR SINUS CARE SPRAY NA) 1 spray, Nasal, Daily PRN   THEANINE PO 25-100 mg, Oral, Daily PRN   Vitamin D3 2,000 Units, Oral, Daily     VITALS:   Vitals:   10/04/23 0922 10/04/23 1133  BP: (!) 148/60 (!) 159/95  Pulse:  86  Resp: 20 20  SpO2:  96%  Height: 5\' 6"  (1.676 m)       PHYSICAL EXAM   HEENT:  Normocephalic, atraumatic.  The superficial temporal arteries are without ropiness or tenderness. Cardiovascular: Regular rate and rhythm. Lungs: Clear to auscultation bilaterally. Neck: There are no carotid bruits noted bilaterally.  NEUROLOGICAL:    10/04/2023    9:00 AM  Montreal Cognitive Assessment   Visuospatial/  Executive (0/5) 1  Naming (0/3) 3  Attention: Read list of digits (0/2) 2  Attention: Read list of letters (0/1) 1  Attention: Serial 7 subtraction starting at 100 (0/3) 1  Language: Repeat phrase (0/2) 2  Language : Fluency (0/1) 1  Abstraction (0/2) 1  Delayed Recall (0/5) 2  Orientation (0/6) 4  Total 18  Adjusted Score (based on education) 18        No data to display           Orientation:  Alert and oriented to person, place and time. No aphasia or dysarthria. Fund of knowledge is appropriate. Recent and remote memory impaired.  Attention and concentration are reduced.  Able to name objects and repeat phrases Delayed recall  2/5 Cranial nerves: There is good facial symmetry Very anxious appearing. Extraocular muscles are intact and visual fields are full to  confrontational testing. Speech is fluent and clear. No tongue deviation. Hearing is intact to conversational tone.  Tone: Tone is good throughout.  Sensation: Sensation is intact to light touch.  Vibration is intact at the bilateral big toe.  Coordination: The patient has no difficulty with RAM's or FNF bilaterally. Normal finger to nose  Motor: Strength is 5/5 in the bilateral upper and lower extremities. There is no pronator drift. There are no fasciculations noted. DTR's: Deep tendon reflexes are 2/4 bilaterally. Gait and Station: The patient is able to ambulate without difficulty. Gait is cautious and narrow. Stride length is normal        Thank you for allowing [place] the opportunity to participate in the care of this nice patient. Please do not hesitate to contact us for any questions or concerns.   Total time spent on today's visit was 111 minutes dedicated to this patient today, preparing to see patient, examining the patient, ordering tests and/or medications and counseling the patient, documenting clinical information in the EHR or other health record, independently interpreting results and communicating results to  the patient/family, discussing treatment and goals, answering patient's questions and coordinating care.  Cc:  Thana Ates, MD  Marlowe Kays 10/04/2023 12:15 PM

## 2023-10-05 DIAGNOSIS — E46 Unspecified protein-calorie malnutrition: Secondary | ICD-10-CM | POA: Diagnosis not present

## 2023-10-05 DIAGNOSIS — R42 Dizziness and giddiness: Secondary | ICD-10-CM | POA: Diagnosis not present

## 2023-10-05 DIAGNOSIS — Z7982 Long term (current) use of aspirin: Secondary | ICD-10-CM | POA: Diagnosis not present

## 2023-10-05 DIAGNOSIS — Z85828 Personal history of other malignant neoplasm of skin: Secondary | ICD-10-CM | POA: Diagnosis not present

## 2023-10-05 DIAGNOSIS — E871 Hypo-osmolality and hyponatremia: Secondary | ICD-10-CM | POA: Diagnosis not present

## 2023-10-05 DIAGNOSIS — M810 Age-related osteoporosis without current pathological fracture: Secondary | ICD-10-CM | POA: Diagnosis not present

## 2023-10-07 DIAGNOSIS — Z7982 Long term (current) use of aspirin: Secondary | ICD-10-CM | POA: Diagnosis not present

## 2023-10-07 DIAGNOSIS — M810 Age-related osteoporosis without current pathological fracture: Secondary | ICD-10-CM | POA: Diagnosis not present

## 2023-10-07 DIAGNOSIS — E871 Hypo-osmolality and hyponatremia: Secondary | ICD-10-CM | POA: Diagnosis not present

## 2023-10-07 DIAGNOSIS — R42 Dizziness and giddiness: Secondary | ICD-10-CM | POA: Diagnosis not present

## 2023-10-07 DIAGNOSIS — E46 Unspecified protein-calorie malnutrition: Secondary | ICD-10-CM | POA: Diagnosis not present

## 2023-10-07 DIAGNOSIS — Z85828 Personal history of other malignant neoplasm of skin: Secondary | ICD-10-CM | POA: Diagnosis not present

## 2023-10-09 DIAGNOSIS — M810 Age-related osteoporosis without current pathological fracture: Secondary | ICD-10-CM | POA: Diagnosis not present

## 2023-10-09 DIAGNOSIS — Z85828 Personal history of other malignant neoplasm of skin: Secondary | ICD-10-CM | POA: Diagnosis not present

## 2023-10-09 DIAGNOSIS — E46 Unspecified protein-calorie malnutrition: Secondary | ICD-10-CM | POA: Diagnosis not present

## 2023-10-09 DIAGNOSIS — E871 Hypo-osmolality and hyponatremia: Secondary | ICD-10-CM | POA: Diagnosis not present

## 2023-10-09 DIAGNOSIS — Z7982 Long term (current) use of aspirin: Secondary | ICD-10-CM | POA: Diagnosis not present

## 2023-10-09 DIAGNOSIS — R42 Dizziness and giddiness: Secondary | ICD-10-CM | POA: Diagnosis not present

## 2023-10-16 DIAGNOSIS — E46 Unspecified protein-calorie malnutrition: Secondary | ICD-10-CM | POA: Diagnosis not present

## 2023-10-16 DIAGNOSIS — Z7982 Long term (current) use of aspirin: Secondary | ICD-10-CM | POA: Diagnosis not present

## 2023-10-16 DIAGNOSIS — M810 Age-related osteoporosis without current pathological fracture: Secondary | ICD-10-CM | POA: Diagnosis not present

## 2023-10-16 DIAGNOSIS — Z85828 Personal history of other malignant neoplasm of skin: Secondary | ICD-10-CM | POA: Diagnosis not present

## 2023-10-16 DIAGNOSIS — E871 Hypo-osmolality and hyponatremia: Secondary | ICD-10-CM | POA: Diagnosis not present

## 2023-10-16 DIAGNOSIS — R42 Dizziness and giddiness: Secondary | ICD-10-CM | POA: Diagnosis not present

## 2023-10-17 ENCOUNTER — Ambulatory Visit (INDEPENDENT_AMBULATORY_CARE_PROVIDER_SITE_OTHER): Payer: Medicare Other | Admitting: Otolaryngology

## 2023-10-17 ENCOUNTER — Encounter (INDEPENDENT_AMBULATORY_CARE_PROVIDER_SITE_OTHER): Payer: Self-pay

## 2023-10-17 VITALS — Ht 64.0 in | Wt 103.0 lb

## 2023-10-17 DIAGNOSIS — H6123 Impacted cerumen, bilateral: Secondary | ICD-10-CM | POA: Diagnosis not present

## 2023-10-17 NOTE — Progress Notes (Signed)
Patient ID: Julia [name], female   DOB: 1935/12/28, 87 y.o.   MRN: [id number]  Procedure: Bilateral cerumen disimpaction.   Indication: Cerumen impaction, resulting in ear discomfort and conductive hearing loss.   Description: The patient is placed supine on the operating table. Under the operating microscope, the right ear canal is examined and is noted to be impacted with cerumen. The cerumen is carefully removed with a combination of suction catheters, cerumen curette, and alligator forceps. After the cerumen removal, the ear canal and tympanic membrane are noted to be normal. No middle ear effusion is noted. The same procedure is then repeated on the left side without exception. The patient tolerated the procedure well.  Follow-up care:  The patient is instructed not to use Q-tips to clean the ear canals. The patient will follow up in 4 months.

## 2023-10-22 DIAGNOSIS — E871 Hypo-osmolality and hyponatremia: Secondary | ICD-10-CM | POA: Diagnosis not present

## 2023-10-22 DIAGNOSIS — Z7982 Long term (current) use of aspirin: Secondary | ICD-10-CM | POA: Diagnosis not present

## 2023-10-22 DIAGNOSIS — M810 Age-related osteoporosis without current pathological fracture: Secondary | ICD-10-CM | POA: Diagnosis not present

## 2023-10-22 DIAGNOSIS — Z85828 Personal history of other malignant neoplasm of skin: Secondary | ICD-10-CM | POA: Diagnosis not present

## 2023-10-22 DIAGNOSIS — E46 Unspecified protein-calorie malnutrition: Secondary | ICD-10-CM | POA: Diagnosis not present

## 2023-10-22 DIAGNOSIS — R42 Dizziness and giddiness: Secondary | ICD-10-CM | POA: Diagnosis not present

## 2023-10-29 ENCOUNTER — Ambulatory Visit
Admission: RE | Admit: 2023-10-29 | Discharge: 2023-10-29 | Disposition: A | Discharge status: Home / Self Care | Payer: Medicare Other | Source: Ambulatory Visit | Attending: Internal Medicine | Admitting: Internal Medicine

## 2023-10-29 DIAGNOSIS — Z1231 Encounter for screening mammogram for malignant neoplasm of breast: Secondary | ICD-10-CM | POA: Diagnosis not present

## 2023-11-03 DIAGNOSIS — E46 Unspecified protein-calorie malnutrition: Secondary | ICD-10-CM | POA: Diagnosis not present

## 2023-11-03 DIAGNOSIS — Z7982 Long term (current) use of aspirin: Secondary | ICD-10-CM | POA: Diagnosis not present

## 2023-11-03 DIAGNOSIS — E871 Hypo-osmolality and hyponatremia: Secondary | ICD-10-CM | POA: Diagnosis not present

## 2023-11-03 DIAGNOSIS — M810 Age-related osteoporosis without current pathological fracture: Secondary | ICD-10-CM | POA: Diagnosis not present

## 2023-11-03 DIAGNOSIS — Z85828 Personal history of other malignant neoplasm of skin: Secondary | ICD-10-CM | POA: Diagnosis not present

## 2023-11-03 DIAGNOSIS — R42 Dizziness and giddiness: Secondary | ICD-10-CM | POA: Diagnosis not present

## 2023-11-05 NOTE — Progress Notes (Signed)
 Cardiology Office Note:   Date:  11/08/2023  ID:  Julia [name], DOB [birth year], MRN [id number] PCP:  Dwight Trula SQUIBB, MD  [facility] Providers Cardiologist:  Wendel Haws, MD Referring MD: Dwight Trula SQUIBB, MD  Chief Complaint/Reason for Referral: Cardiology follow-up ASSESSMENT:    1. Coronary artery disease involving native coronary artery of native heart without angina pectoris   2. Aortic atherosclerosis (HCC)   3. Hyperlipidemia LDL goal <70   4. Primary hypertension   5. SVT (supraventricular tachycardia) (HCC)   6. LBBB (left bundle branch block)   7. Diastolic dysfunction   8. Memory impairment     PLAN:   In order of problems listed above: Coronary artery disease: Mild on coronary CTA.  Continue Zetia  and aspirin. Atherosclerosis: Continue Zetia  and aspirin; patient unwilling to take statins. Hyperlipidemia: Will check lipid panel today.  Patient intolerant of statins.  Defer LP(a) testing. Hypertension: Blood pressure is well-controlled today. SVT: Been an issue.  Monitor for now. Left bundle branch block: Continue to monitor. Diastolic dysfunction: Patient is euvolemic on exam today.  Monitor.            Dispo:  Return in about 1 year (around 11/07/2024).      Medication Adjustments/Labs and Tests Ordered: Current medicines are reviewed at length with the patient today.  Concerns regarding medicines are outlined above.  The following changes have been made:  no change   Labs/tests ordered: Orders Placed This Encounter  Procedures   Lipid panel   Iron   Vitamin B6   B12    Medication Changes: No orders of the defined types were placed in this encounter.   Current medicines are reviewed at length with the patient today.  The patient does not have concerns regarding medicines.  I spent 31 minutes reviewing all clinical data during and prior to this visit including all relevant imaging studies, laboratories, clinical information from other health  systems and prior notes from both Cardiology and other specialties, interviewing the patient, conducting a complete physical examination, and coordinating care in order to formulate a comprehensive and personalized evaluation and treatment plan.   History of Present Illness:      FOCUSED PROBLEM LIST:   CAD Mild, coronary CTA 2023 Dilated ascending aorta CT 10/2021: 4.1 cm  Left Bundle Branch Block Diastolic dysfunction Grade 1 DD, EF 50 to 55% TTE 2023 Hyperlipidemia  Intolerant to pravastatin and rosuvastatin  Aortic atherosclerosis  Coronary CTA 2023 Hypertension Possibly whitecoat hypertension SVT Monitor 2021 Uncharacterized memory loss Followed by neurology  1/25: The patient returns for routine follow-up.  She was last seen 6 months ago and was doing well without angina or signs or symptoms of heart failure.  Notably the patient was seen by neurology due to ongoing memory issues.  No formal diagnosis has been made.  She continues to do well from a cardiovascular standpoint.  She denies any chest pain, significant shortness of breath, peripheral edema, paroxysmal nocturnal dyspnea.  She has had no signs or symptoms of stroke.  She occasionally gets lightheaded when she goes from sitting to standing quickly but has had no frank syncope.  She was seen in the emergency department in October due to memory issues that ended up leading to a neurologic evaluation.  As detailed above, a formal diagnosis of her memory issues has not yet been made.  She does check her blood pressures on a regular basis at home and they are typically in the 130s systolic.  Other  than chronic back pain and ongoing memory issues she is without significant complaints today.          Current Medications: Current Meds  Medication Sig   acetaminophen  (TYLENOL ) 500 MG tablet Take 500 mg by mouth every 6 (six) hours as needed for moderate pain or headache.   aspirin 81 MG chewable tablet Chew 81 mg by mouth  daily.   carboxymethylcellulose (REFRESH PLUS) 0.5 % SOLN Place 1 drop into both eyes 3 (three) times daily as needed (dry eyes).   Cholecalciferol (VITAMIN D3) 2000 UNITS capsule Take 2,000 Units by mouth daily.   citalopram (CELEXA) 10 MG tablet Take 10 mg by mouth daily.   COLLAGEN PO Take 5 mLs by mouth daily.   docusate sodium (COLACE) 100 MG capsule Take 100 mg by mouth 2 (two) times daily.   ezetimibe  (ZETIA ) 10 MG tablet TAKE 1 TABLET(10 MG) BY MOUTH DAILY   famotidine (PEPCID) 10 MG tablet Take 10 mg by mouth 2 (two) times daily.   latanoprost (XALATAN) 0.005 % ophthalmic solution Apply to eye.   nitroGLYCERIN  (NITROSTAT ) 0.4 MG SL tablet Place 0.4 mg under the tongue every 5 (five) minutes as needed for chest pain.   Polyethyl Glycol-Propyl Glycol (SYSTANE) 0.4-0.3 % SOLN as directed Ophthalmic   PREVIDENT 5000 BOOSTER PLUS 1.1 % PSTE Place 1 application onto teeth at bedtime.   Probiotic Product (PROBIOTIC PO) Take 1 capsule by mouth daily.   pyridoxine (B-6) 100 MG tablet Take 100 mg by mouth daily.   Sodium Chloride -Xylitol (XLEAR SINUS CARE SPRAY NA) Place 1 spray into the nose daily as needed (sinus congestion).   THEANINE PO Take 25-100 mg by mouth daily as needed (sleep & stress).     Review of Systems:   Please see the history of present illness.    All other systems reviewed and are negative.     EKGs/Labs/Other Test Reviewed:   EKG: EKG from 2024 that I personally reviewed demonstrates sinus rhythm and IVCD  EKG Interpretation Date/Time:    Ventricular Rate:    PR Interval:    QRS Duration:    QT Interval:    QTC Calculation:   R Axis:      Text Interpretation:           Risk Assessment/Calculations:          Physical Exam:   VS:  BP 136/80   Pulse 85   Ht 5' 5.5 (1.664 m)   Wt 101 lb (45.8 kg)   SpO2 97%   BMI 16.55 kg/m        Wt Readings from Last 3 Encounters:  11/08/23 101 lb (45.8 kg)  10/17/23 103 lb (46.7 kg)  06/25/23 105 lb  (47.6 kg)      GENERAL:  No apparent distress, AOx3 HEENT:  No carotid bruits, +2 carotid impulses, no scleral icterus CAR: RRR no murmurs, gallops, rubs, or thrills RES:  Clear to auscultation bilaterally ABD:  Soft, nontender, nondistended, positive bowel sounds x 4 VASC:  +2 radial pulses, +2 carotid pulses NEURO:  CN 2-12 grossly intact; motor and sensory grossly intact PSYCH:  No active depression or anxiety EXT:  No edema, ecchymosis, or cyanosis  Signed, Draco Malczewski K Mirabelle Cyphers, MD  11/08/2023 10:50 AM    [facility] [address] [place], [place], [place]  [address] Phone: [phone]; Fax: [phone]   Note:  This document was prepared using Dragon voice recognition software and may include unintentional dictation  errors.

## 2023-11-08 ENCOUNTER — Ambulatory Visit: Payer: Medicare Other | Attending: Internal Medicine | Admitting: Internal Medicine

## 2023-11-08 ENCOUNTER — Encounter: Payer: Self-pay | Admitting: Internal Medicine

## 2023-11-08 VITALS — BP 136/80 | HR 85 | Ht 65.5 in | Wt 101.0 lb

## 2023-11-08 DIAGNOSIS — I471 Supraventricular tachycardia, unspecified: Secondary | ICD-10-CM | POA: Insufficient documentation

## 2023-11-08 DIAGNOSIS — I10 Essential (primary) hypertension: Secondary | ICD-10-CM | POA: Insufficient documentation

## 2023-11-08 DIAGNOSIS — I2510 Atherosclerotic heart disease of native coronary artery without angina pectoris: Secondary | ICD-10-CM | POA: Diagnosis not present

## 2023-11-08 DIAGNOSIS — E785 Hyperlipidemia, unspecified: Secondary | ICD-10-CM | POA: Insufficient documentation

## 2023-11-08 DIAGNOSIS — I447 Left bundle-branch block, unspecified: Secondary | ICD-10-CM | POA: Insufficient documentation

## 2023-11-08 DIAGNOSIS — R413 Other amnesia: Secondary | ICD-10-CM | POA: Insufficient documentation

## 2023-11-08 DIAGNOSIS — I700 Atherosclerosis of aorta: Secondary | ICD-10-CM | POA: Insufficient documentation

## 2023-11-08 DIAGNOSIS — I5189 Other ill-defined heart diseases: Secondary | ICD-10-CM | POA: Diagnosis not present

## 2023-11-08 NOTE — Patient Instructions (Signed)
 Medication Instructions:  No changes *If you need a refill on your cardiac medications before your next appointment, please call your pharmacy*   Lab Work: Go to the [facility] on first floor for blood work: Iron, b6, b12 and lipid panel  If you have labs (blood work) drawn today and your tests are completely normal, you will receive your results only by: MyChart Message (if you have MyChart) OR A paper copy in the mail If you have any lab test that is abnormal or we need to change your treatment, we will call you to review the results.   Testing/Procedures: none   Follow-Up: At [facility], you and your health needs are our priority.  As part of our continuing mission to provide you with exceptional heart care, we have created designated Provider Care Teams.  These Care Teams include your primary Cardiologist (physician) and Advanced Practice Providers (APPs -  Physician Assistants and Nurse Practitioners) who all work together to provide you with the care you need, when you need it.   Your next appointment:   12 month(s)  Provider:   Orren Fabry, PA-C, Dayna Dunn, PA-C, Jackee Alberts, NP, Olivia Pavy, PA-C, Rosaline Bane, NP, Glendia Ferrier, PA-C, or Artist Pouch, PA-C

## 2023-11-13 LAB — LIPID PANEL
Chol/HDL Ratio: 2.2 {ratio} (ref 0.0–4.4)
Cholesterol, Total: 207 mg/dL — ABNORMAL HIGH (ref 100–199)
HDL: 95 mg/dL (ref >39)
LDL Chol Calc (NIH): 101 mg/dL — ABNORMAL HIGH (ref 0–99)
Triglycerides: 63 mg/dL (ref 0–149)
VLDL Cholesterol Cal: 11 mg/dL (ref 5–40)

## 2023-11-13 LAB — VITAMIN B6: Vitamin B6: 159 ug/L — ABNORMAL HIGH (ref 3.4–65.2)

## 2023-11-13 LAB — VITAMIN B12: Vitamin B-12: 1247 pg/mL — ABNORMAL HIGH (ref 232–1245)

## 2023-11-13 LAB — IRON: Iron: 56 ug/dL (ref 27–139)

## 2023-11-29 DIAGNOSIS — E46 Unspecified protein-calorie malnutrition: Secondary | ICD-10-CM | POA: Diagnosis not present

## 2023-11-29 DIAGNOSIS — Z0000 Encounter for general adult medical examination without abnormal findings: Secondary | ICD-10-CM | POA: Diagnosis not present

## 2023-11-29 DIAGNOSIS — M545 Low back pain, unspecified: Secondary | ICD-10-CM | POA: Diagnosis not present

## 2023-11-29 DIAGNOSIS — M810 Age-related osteoporosis without current pathological fracture: Secondary | ICD-10-CM | POA: Diagnosis not present

## 2023-11-29 DIAGNOSIS — I2510 Atherosclerotic heart disease of native coronary artery without angina pectoris: Secondary | ICD-10-CM | POA: Diagnosis not present

## 2023-11-29 DIAGNOSIS — R4189 Other symptoms and signs involving cognitive functions and awareness: Secondary | ICD-10-CM | POA: Diagnosis not present

## 2023-11-29 DIAGNOSIS — Z8781 Personal history of (healed) traumatic fracture: Secondary | ICD-10-CM | POA: Diagnosis not present

## 2023-11-29 DIAGNOSIS — Z23 Encounter for immunization: Secondary | ICD-10-CM | POA: Diagnosis not present

## 2023-11-29 DIAGNOSIS — F419 Anxiety disorder, unspecified: Secondary | ICD-10-CM | POA: Diagnosis not present

## 2023-11-29 DIAGNOSIS — Z1331 Encounter for screening for depression: Secondary | ICD-10-CM | POA: Diagnosis not present

## 2023-11-29 DIAGNOSIS — E871 Hypo-osmolality and hyponatremia: Secondary | ICD-10-CM | POA: Diagnosis not present

## 2023-11-29 DIAGNOSIS — R1310 Dysphagia, unspecified: Secondary | ICD-10-CM | POA: Diagnosis not present

## 2023-11-29 DIAGNOSIS — Z79899 Other long term (current) drug therapy: Secondary | ICD-10-CM | POA: Diagnosis not present

## 2023-12-17 DIAGNOSIS — E871 Hypo-osmolality and hyponatremia: Secondary | ICD-10-CM | POA: Diagnosis not present

## 2023-12-27 DIAGNOSIS — H4051X3 Glaucoma secondary to other eye disorders, right eye, severe stage: Secondary | ICD-10-CM | POA: Diagnosis not present

## 2024-01-08 NOTE — Telephone Encounter (Signed)
 Per patient:  Julia [name] "Julia [name]" to [facility] (supporting Rodolph Bong, MD)      08/26/23  4:19 PM I was in [facility] on Friday, Oct. 25,  to be evaluated for possible stroke due to memory loss issues I have been experiencing. It was determined I had not had a stroke and should be further evaluated by my PCP and a neurologist. Due to this situation, may we postpone the appointment? Thank you, Julia [name]

## 2024-01-08 NOTE — Telephone Encounter (Signed)
Pt archived in [email or web address]. ° °Please advise if patient and/or provider wish to proceed with [employer] therpay. ° °

## 2024-02-29 ENCOUNTER — Ambulatory Visit (INDEPENDENT_AMBULATORY_CARE_PROVIDER_SITE_OTHER): Payer: Medicare Other | Admitting: Otolaryngology

## 2024-02-29 VITALS — Ht 64.5 in | Wt 100.0 lb

## 2024-02-29 DIAGNOSIS — H6123 Impacted cerumen, bilateral: Secondary | ICD-10-CM

## 2024-02-29 NOTE — Progress Notes (Signed)
 Patient ID: Julia [name], female   DOB: 1935/12/28, 88 y.o.   MRN: [id number]  Procedure: Bilateral cerumen disimpaction.   Indication: Cerumen impaction, resulting in ear discomfort and conductive hearing loss.   Description: The patient is placed supine on the operating table. Under the operating microscope, the right ear canal is examined and is noted to be impacted with cerumen. The cerumen is carefully removed with a combination of suction catheters, cerumen curette, and alligator forceps. After the cerumen removal, the ear canal and tympanic membrane are noted to be normal. No middle ear effusion is noted. The same procedure is then repeated on the left side without exception. The patient tolerated the procedure well.  Follow-up care:  The patient is instructed not to use Q-tips to clean the ear canals. The patient will follow up in 4 months.

## 2024-03-27 DIAGNOSIS — F419 Anxiety disorder, unspecified: Secondary | ICD-10-CM | POA: Diagnosis not present

## 2024-03-27 DIAGNOSIS — M545 Low back pain, unspecified: Secondary | ICD-10-CM | POA: Diagnosis not present

## 2024-03-27 DIAGNOSIS — Z8781 Personal history of (healed) traumatic fracture: Secondary | ICD-10-CM | POA: Diagnosis not present

## 2024-03-27 DIAGNOSIS — R4189 Other symptoms and signs involving cognitive functions and awareness: Secondary | ICD-10-CM | POA: Diagnosis not present

## 2024-03-27 DIAGNOSIS — E46 Unspecified protein-calorie malnutrition: Secondary | ICD-10-CM | POA: Diagnosis not present

## 2024-04-03 ENCOUNTER — Ambulatory Visit (INDEPENDENT_AMBULATORY_CARE_PROVIDER_SITE_OTHER): Payer: Medicare Other | Admitting: Physician Assistant

## 2024-04-03 ENCOUNTER — Encounter: Payer: Self-pay | Admitting: Physician Assistant

## 2024-04-03 VITALS — BP 136/78 | HR 82 | Resp 20 | Wt 109.0 lb

## 2024-04-03 DIAGNOSIS — R413 Other amnesia: Secondary | ICD-10-CM | POA: Diagnosis not present

## 2024-04-03 NOTE — Progress Notes (Signed)
 Assessment/Plan:    Memory impairment of unclear etiology, likely multifactorial  Julia [name] is a delightful 88 y.o. 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 Most recent MRI brain showed chronic microvascular changes without acute findings,  and mild atrophy, mild cerebral volume loss.  Etiology still unclear, likely multifactorial, with the component of high anxiety.  Recently she was placed on Zoloft, we discussed the role of CBT, which could be very beneficial to her.  She has neuropsych evaluation in the near future, for diagnostic clarity.  If indicated, will start antidementia medication.  Recommendations:   Follow up in 6  months. Patient is scheduled for neuropsych evaluation in August 2025 for diagnostic clarity and to determine other causes of memory loss including anxiety and depression.  Will consider antidementia medication if indicated by neuropsych evaluation. Recommend good control of cardiovascular risk factors, continue baby aspirin daily. Continue to control mood as per PCP, recommend psychiatry and psychotherapy for underlying depression and anxiety   Subjective:   This patient is accompanied in the office by her husband and son who supplements the history. Previous records as well as any outside records available were reviewed prior to todays visit.   Patient was last seen on 10/04/2023 with a MoCA of 18/30.    Any changes in memory since last visit? "We are paying more attention but she is often confused"-son says.  She continues to have issues with short-term memory, with difficulty remembering information, recent conversations and names, worse with increased anxiety.  "She writes a note and forgets where she leaves the note.  ". repeats  oneself?  Endorsed, especially with appointments.  She can change to multiple conversations within a few minutes. Disoriented when walking into a room?  Patient denies    Misplacing objects?  She misplaces papers and files. She checks her purse 30 times to make sure things are okay.   Wandering behavior?   Denies. Any personality changes since last visit?  As before, she has constant worry, "lots of anxiety ".  She is afraid that she has to live in memory care (she is moving to independent living within the next few weeks) Any worsening depression?: denies.   Hallucinations or paranoia?  Denies.   Seizures?   Denies.    Any sleep changes? Sleeps well . Denies vivid dreams, REM behavior or sleepwalking   Sleep apnea?   denies    Any hygiene concerns?   Denies.   Independent of bathing and dressing?  Endorsed  Does the patient needs help with medications? Patient is in charge  Who is in charge of the finances?  Husband is in charge as she has difficulty comprehending financial issues that prior she has done well.     Any changes in appetite?  Decreased, she has not been drinking enough water.    Patient have trouble swallowing?  Denies.   Does the patient cook?  Any kitchen accidents such as leaving the stove on?   Denies.   Any headaches?    Denies.  She has chronic dizziness and a prior history of benign positional vertigo (normal cerebellum, no stroke per MRI of the brain) Vision changes? Has glaucoma. Chronic pain?  She has chronic back pain followed by orthopedics.  She had been doing injections in  the past but then became concerned about side effects so she placed it on hold. Ambulates with difficulty?    Denies.    Recent falls or head injuries?  Denies.      Unilateral weakness, numbness or tingling?.  Chronic bilateral paresthesias, with normal EMG-NCS September 2024, negative for neuropathy. Any tremors?  Denies.   Any anosmia?    Denies.   Any incontinence of urine?  Stress  incontinence, uses pads Any bowel dysfunction?  Denies.      Patient lives with her husband. Moving to [place].  She is closing tomorrow. Does the patient drive?  Yes, she denies getting lost.   Initial visit 10/04/2023    How long did patient have memory difficulties?  She is not sure when these started. Son reports that it may have been over the last year, may be worse over the last 2 months. Patient reports some difficulty remembering new information, recent conversations, names. She is very anxious and worry about this issue. repeats oneself?  Endorsed, asking for what objects are such as the phone, and asking about appointments Disoriented when walking into a room?  Patient denies.    Leaving objects in unusual places?  Misplaces papers and files.   Wandering behavior? Denies.   Any personality changes, or depression, anxiety? Endorsed  "Lots of anxiety, I am scared to death"   She is concerned about what her [employer] friends, "what do I tell them, what do I have?".  Hallucinations or paranoia? denies   Seizures? Denies.    Any sleep changes? Sleeps well. Denies vivid dreams, REM behavior or sleepwalking   Sleep apnea? Denies.   Any hygiene concerns?  Denies.   Independent of bathing and dressing? Endorsed  Does the patient need help with medications?  Patient is in charge. She may be missing doses.    Who is in charge of the finances?  Husband is in charge . Difficulty comprehending financial issues that she had done before without issues.  Any changes in appetite?  Appetite is decreased, has dropped significant amount of weight. "Just not hungry". Does not drink enough water.     Patient have trouble swallowing?  Denies.   Does the patient cook? No  Any headaches?  Denies.   Chronic pain?  She has chronic back pain followed by orthopedic. She had been taking injections, placed them on hold for a while because concerns for side effects .  Ambulates with difficulty? Denies    Recent falls or head injuries? Denies.     Vision changes?  Denies any new issues  Any strokelike symptoms? Denies.  She has chronic bilateral paresthesias, with most recent EMG /NCS on September 2024 yielding normal results, negative for neuropathy Any tremors? Denies.   Any anosmia? Denies.   Any incontinence of urine? Stress incontinence, uses pads  Any bowel dysfunction? Chronic constipation       Patient lives with her husband.  History of heavy alcohol intake? Denies.   History of heavy tobacco use? Denies.   Family history of dementia?   Mother had dementia in the early 64s, PGM AD  Does patient drive? Yes, denies getting lost   MRI brain 09/16/23 personally reviewed remarkable for chronic microvascular changes without acute findings and mild atrophy, mild cerebral volume loss.   Past Medical History:  Diagnosis Date   Aortic insufficiency    a. mild by echo 2016. // Echo 06/2022: EF 50-55, no RWMA, septal movement consistent with LBBB, GR 1  DD, normal RVSF, small pericardial effusion, mild MR, mild AI, AV sclerosis without stenosis, RA pressure 3   Chest pain    Nuclear March, 2011, normal, ejection fraction 64%   Coronary artery disease involving native coronary artery of native heart without angina pectoris 07/11/2022   CCTA 9/23: CAC score 158 (49th percentile); non-obstructive CAD (mLAD 25-49, pOM1 25-49, pRCA 1-24, PLV 1-24); small pericardial effusion   Dyslipidemia    Elevated blood pressure reading    GERD (gastroesophageal reflux disease)    Patient sometimes has slight right lower jaw discomfort at the time of her reflux   LBBB (left bundle branch block)    Pericardial effusion    Echo December, 2009, small, incidental   Shoulder pain    Left scapular pain, appeared to be radicular     Past Surgical History:  Procedure Laterality Date   25 GAUGE PARS PLANA VITRECTOMY WITH 20 GAUGE MVR PORT FOR MACULAR HOLE  2000   BREAST BIOPSY  1983   benign    CHOLECYSTECTOMY  1984   IR KYPHO LUMBAR INC FX REDUCE BONE BX UNI/BIL CANNULATION INC/IMAGING  12/21/2022   IR RADIOLOGIST EVAL & MGMT  12/19/2022   IR RADIOLOGIST EVAL & MGMT  01/04/2023   MELANOMA EXCISION  2012, 2013   NASAL SEPTUM SURGERY  1993   ROTATOR CUFF REPAIR  2002     PREVIOUS MEDICATIONS:   CURRENT MEDICATIONS:  Outpatient Encounter Medications as of 04/03/2024  Medication Sig   acetaminophen  (TYLENOL ) 500 MG tablet Take 500 mg by mouth every 6 (six) hours as needed for moderate pain or headache.   aspirin 81 MG chewable tablet Chew 81 mg by mouth daily.   carboxymethylcellulose (REFRESH PLUS) 0.5 % SOLN Place 1 drop into both eyes 3 (three) times daily as needed (dry eyes).   Cholecalciferol (VITAMIN D3) 2000 UNITS capsule Take 2,000 Units by mouth daily.   docusate sodium (COLACE) 100 MG capsule Take 100 mg by mouth 2 (two) times daily.   ezetimibe  (ZETIA ) 10 MG tablet TAKE 1 TABLET(10 MG) BY MOUTH DAILY   famotidine (PEPCID) 10 MG tablet Take 10 mg by mouth 2 (two) times daily.   latanoprost (XALATAN) 0.005 % ophthalmic solution Apply to eye.   Polyethyl Glycol-Propyl Glycol (SYSTANE) 0.4-0.3 % SOLN as directed Ophthalmic   PREVIDENT 5000 BOOSTER PLUS 1.1 % PSTE Place 1 application onto teeth at bedtime.   Sodium Chloride -Xylitol (XLEAR SINUS CARE SPRAY NA) Place 1 spray into the nose daily as needed (sinus congestion).   THEANINE PO Take 25-100 mg by mouth daily as needed (sleep & stress).   [DISCONTINUED] citalopram (CELEXA) 10 MG tablet Take 10 mg by mouth daily.   [DISCONTINUED] COLLAGEN PO Take 5 mLs by mouth daily.   [DISCONTINUED] nitroGLYCERIN  (NITROSTAT ) 0.4 MG SL tablet Place 0.4 mg under the tongue every 5 (five) minutes as needed for chest pain.   [DISCONTINUED] Probiotic Product (PROBIOTIC PO) Take 1 capsule by mouth daily.   [DISCONTINUED] pyridoxine (B-6) 100 MG tablet Take 100 mg by mouth daily.   [DISCONTINUED] Romosozumab -aqqg (EVENITY ) 105 MG/1. SOSY  injection  (Patient not taking: Reported on 02/29/2024)   No facility-administered encounter medications on file as of 04/03/2024.     Objective:     PHYSICAL EXAMINATION:    VITALS:   Vitals:   04/03/24 1105  BP: 136/78  Pulse: 82  Resp: 20  SpO2: 96%  Weight: 109 lb (49.4 kg)    GEN:  The patient appears stated  age and is in NAD. HEENT:  Normocephalic, atraumatic.   Neurological examination:  General: NAD, well-groomed, appears stated age. Orientation: The patient is alert. Oriented to person, place and not to date.  Cranial nerves: There is good facial symmetry. Highly anxious the speech is fluent and clear. No aphasia or dysarthria. Fund of knowledge is appropriate. Recent memory impaired and remote memory is normal.  Attention and concentration are normal.  Able to name objects and repeat phrases.  Hearing is intact to conversational tone   Sensation: Sensation is intact to light touch throughout Motor: Strength is at least antigravity x4. DTR's 2/4 in UE/LE      10/10/2023    9:00 AM 10/04/2023    9:00 AM  Montreal Cognitive Assessment   Visuospatial/ Executive (0/5) 2 1  Naming (0/3) 3 3  Attention: Read list of digits (0/2) 2 2  Attention: Read list of letters (0/1) 1 1  Attention: Serial 7 subtraction starting at 100 (0/3) 1 1  Language: Repeat phrase (0/2) 2 2  Language : Fluency (0/1) 1 1  Abstraction (0/2) 1 1  Delayed Recall (0/5) 2 2  Orientation (0/6) 4 4  Total 19 18  Adjusted Score (based on education) 19 18        No data to display             Movement examination: Tone: There is normal tone in the UE/LE Abnormal movements:  no tremor.  No myoclonus.  No asterixis.   Coordination:  There is no decremation with RAM's. Normal finger to nose  Gait and Station: The patient has no difficulty arising out of a deep-seated chair without the use of the hands. The patient's stride length is good.  Gait is cautious and narrow.   Thank you for  allowing us  the opportunity to participate in the care of this nice patient. Please do not hesitate to contact us  for any questions or concerns.   Total time spent on today's visit was 45 minutes dedicated to this patient today, preparing to see patient, examining the patient, ordering tests and/or medications and counseling the patient, documenting clinical information in the EHR or other health record, independently interpreting results and communicating results to the patient/family, discussing treatment and goals, answering patient's questions and coordinating care.  Cc:  Tena Feeling, MD  Tex Filbert 04/03/2024 12:36 PM

## 2024-04-03 NOTE — Patient Instructions (Addendum)
 It was a pleasure to see you today at our office.   Recommendations:  Neurocognitive evaluation at our office   Follow up in  Jan 6,  11:30 am  Please address anxiety with your doctor  Please go over with your doctor about the OTC    For psychiatric meds, mood meds: Please have your primary care physician manage these medications.  If you have any severe symptoms of a stroke, or other severe issues such as confusion,severe chills or fever, etc call 911 or go to the ER as you may need to be evaluated further    For assessment of decision of mental capacity and competency:  Call Dr. Laverne Potter, geriatric psychiatrist at [phone]  Counseling regarding caregiver distress, including caregiver depression, anxiety and issues regarding community resources, adult day care programs, adult living facilities, or memory care questions:  please contact your  Primary Doctor's Social Worker   Whom to call: Memory  decline, memory medications: Call our office [phone]    [email or web address]   RECOMMENDATIONS FOR ALL PATIENTS WITH MEMORY PROBLEMS: 1. Continue to exercise (Recommend 30 minutes of walking everyday, or 3 hours every week) 2. Increase social interactions - continue going to [place] and enjoy social gatherings with friends and family 3. Eat healthy, avoid fried foods and eat more fruits and vegetables 4. Maintain adequate blood pressure, blood sugar, and blood cholesterol level. Reducing the risk of stroke and cardiovascular disease also helps promoting better memory. 5. Avoid stressful situations. Live a simple life and avoid aggravations. Organize your time and prepare for the next day in anticipation. 6. Sleep well, avoid any interruptions of sleep and avoid any distractions in the bedroom that may interfere with adequate sleep quality 7. Avoid sugar, avoid sweets as there is a strong link between excessive sugar intake,  diabetes, and cognitive impairment We discussed the Mediterranean diet, which has been shown to help patients reduce the risk of progressive memory disorders and reduces cardiovascular risk. This includes eating fish, eat fruits and green leafy vegetables, nuts like almonds and hazelnuts, walnuts, and also use olive oil. Avoid fast foods and fried foods as much as possible. Avoid sweets and sugar as sugar use has been linked to worsening of memory function.  There is always a concern of gradual progression of memory problems. If this is the case, then we may need to adjust level of care according to patient needs. Support, both to the patient and caregiver, should then be put into place.      DRIVING: Regarding driving, in patients with progressive memory problems, driving will be impaired. We advise to have someone else do the driving if trouble finding directions or if minor accidents are reported. Independent driving assessment is available to determine safety of driving.   If you are interested in the driving assessment, you can contact the following:  [facility] [employer] [facility] in [place] [phone]  [facility] [phone]  [facility] [phone]  [facility] [phone] or [phone]   FALL PRECAUTIONS: Be cautious when walking. Scan the area for obstacles that may increase the risk of trips and falls. When getting up in the mornings, sit up at the edge of the bed for a few minutes before getting out of bed. Consider elevating the bed at the head end to avoid drop of blood pressure when getting up. Walk always in a well-lit room (use night lights in the walls). Avoid area rugs or power cords from appliances in the middle of  the walkways. Use a walker or a cane if necessary and consider physical therapy for balance exercise. Get your eyesight checked regularly.  FINANCIAL OVERSIGHT: Supervision, especially oversight when making financial  decisions or transactions is also recommended.  HOME SAFETY: Consider the safety of the kitchen when operating appliances like stoves, microwave oven, and blender. Consider having supervision and share cooking responsibilities until no longer able to participate in those. Accidents with firearms and other hazards in the house should be identified and addressed as well.   ABILITY TO BE LEFT ALONE: If patient is unable to contact 911 operator, consider using LifeLine, or when the need is there, arrange for someone to stay with patients. Smoking is a fire hazard, consider supervision or cessation. Risk of wandering should be assessed by caregiver and if detected at any point, supervision and safe proof recommendations should be instituted.  MEDICATION SUPERVISION: Inability to self-administer medication needs to be constantly addressed. Implement a mechanism to ensure safe administration of the medications.      Mediterranean Diet A Mediterranean diet refers to food and lifestyle choices that are based on the traditions of countries located on the [employer]. This way of eating has been shown to help prevent certain conditions and improve outcomes for people who have chronic diseases, like kidney disease and heart disease. What are tips for following this plan? Lifestyle  Cook and eat meals together with your family, when possible. Drink enough fluid to keep your urine clear or pale yellow. Be physically active every day. This includes: Aerobic exercise like running or swimming. Leisure activities like gardening, walking, or housework. Get 7-8 hours of sleep each night. If recommended by your health care provider, drink red wine in moderation. This means 1 glass a day for nonpregnant women and 2 glasses a day for men. A glass of wine equals 5 oz (150 mL). Reading food labels  Check the serving size of packaged foods. For foods such as rice and pasta, the serving size refers to the amount  of cooked product, not dry. Check the total fat in packaged foods. Avoid foods that have saturated fat or trans fats. Check the ingredients list for added sugars, such as corn syrup. Shopping  At the grocery store, buy most of your food from the areas near the walls of the store. This includes: Fresh fruits and vegetables (produce). Grains, beans, nuts, and seeds. Some of these may be available in unpackaged forms or large amounts (in bulk). Fresh seafood. Poultry and eggs. Low-fat dairy products. Buy whole ingredients instead of prepackaged foods. Buy fresh fruits and vegetables in-season from local farmers markets. Buy frozen fruits and vegetables in resealable bags. If you do not have access to quality fresh seafood, buy precooked frozen shrimp or canned fish, such as tuna, salmon, or sardines. Buy small amounts of raw or cooked vegetables, salads, or olives from the deli or salad bar at your store. Stock your pantry so you always have certain foods on hand, such as olive oil, canned tuna, canned tomatoes, rice, pasta, and beans. Cooking  Cook foods with extra-virgin olive oil instead of using butter or other vegetable oils. Have meat as a side dish, and have vegetables or grains as your main dish. This means having meat in small portions or adding small amounts of meat to foods like pasta or stew. Use beans or vegetables instead of meat in common dishes like chili or lasagna. Experiment with different cooking methods. Try roasting or broiling vegetables instead of  steaming or sauteing them. Add frozen vegetables to soups, stews, pasta, or rice. Add nuts or seeds for added healthy fat at each meal. You can add these to yogurt, salads, or vegetable dishes. Marinate fish or vegetables using olive oil, lemon juice, garlic, and fresh herbs. Meal planning  Plan to eat 1 vegetarian meal one day each week. Try to work up to 2 vegetarian meals, if possible. Eat seafood 2 or more times a  week. Have healthy snacks readily available, such as: Vegetable sticks with hummus. Greek yogurt. Fruit and nut trail mix. Eat balanced meals throughout the week. This includes: Fruit: 2-3 servings a day Vegetables: 4-5 servings a day Low-fat dairy: 2 servings a day Fish, poultry, or lean meat: 1 serving a day Beans and legumes: 2 or more servings a week Nuts and seeds: 1-2 servings a day Whole grains: 6-8 servings a day Extra-virgin olive oil: 3-4 servings a day Limit red meat and sweets to only a few servings a month What are my food choices? Mediterranean diet Recommended Grains: Whole-grain pasta. Brown rice. Bulgar wheat. Polenta. Couscous. Whole-wheat bread. Dwyane Glad. Vegetables: Artichokes. Beets. Broccoli. Cabbage. Carrots. Eggplant. Green beans. Chard. Kale. Spinach. Onions. Leeks. Peas. Squash. Tomatoes. Peppers. Radishes. Fruits: Apples. Apricots. Avocado. Berries. Bananas. Cherries. Dates. Figs. Grapes. Lemons. Melon. Oranges. Peaches. Plums. Pomegranate. Meats and other protein foods: Beans. Almonds. Sunflower seeds. Pine nuts. Peanuts. Cod. Salmon. Scallops. Shrimp. Tuna. Tilapia. Clams. Oysters. Eggs. Dairy: Low-fat milk. Cheese. Greek yogurt. Beverages: Water. Red wine. Herbal tea. Fats and oils: Extra virgin olive oil. Avocado oil. Grape seed oil. Sweets and desserts: [place] yogurt with honey. Baked apples. Poached pears. Trail mix. Seasoning and other foods: Basil. Cilantro. Coriander. Cumin. Mint. Parsley. Sage. Rosemary. Tarragon. Garlic. Oregano. Thyme. Pepper. Balsalmic vinegar. Tahini. Hummus. Tomato sauce. Olives. Mushrooms. Limit these Grains: Prepackaged pasta or rice dishes. Prepackaged cereal with added sugar. Vegetables: Deep fried potatoes (french fries). Fruits: Fruit canned in syrup. Meats and other protein foods: Beef. Pork. Lamb. Poultry with skin. Hot dogs. Helene Loader. Dairy: Ice cream. Sour cream. Whole milk. Beverages: Juice. Sugar-sweetened soft  drinks. Beer. Liquor and spirits. Fats and oils: Butter. Canola oil. Vegetable oil. Beef fat (tallow). Lard. Sweets and desserts: Cookies. Cakes. Pies. Candy. Seasoning and other foods: Mayonnaise. Premade sauces and marinades. The items listed may not be a complete list. Talk with your dietitian about what dietary choices are right for you. Summary The Mediterranean diet includes both food and lifestyle choices. Eat a variety of fresh fruits and vegetables, beans, nuts, seeds, and whole grains. Limit the amount of red meat and sweets that you eat. Talk with your health care provider about whether it is safe for you to drink red wine in moderation. This means 1 glass a day for nonpregnant women and 2 glasses a day for men. A glass of wine equals 5 oz (150 mL). This information is not intended to replace advice given to you by your health care provider. Make sure you discuss any questions you have with your health care provider. Document Released: 06/08/2016 Document Revised: 07/11/2016 Document Reviewed: 06/08/2016 Elsevier Interactive Patient Education  2017 [employer].

## 2024-04-14 DIAGNOSIS — Z7189 Other specified counseling: Secondary | ICD-10-CM | POA: Diagnosis not present

## 2024-04-15 ENCOUNTER — Encounter: Payer: Self-pay | Admitting: Psychology

## 2024-04-24 DIAGNOSIS — H4051X3 Glaucoma secondary to other eye disorders, right eye, severe stage: Secondary | ICD-10-CM | POA: Diagnosis not present

## 2024-04-27 ENCOUNTER — Other Ambulatory Visit: Payer: Self-pay | Admitting: Physician Assistant

## 2024-05-01 DIAGNOSIS — H524 Presbyopia: Secondary | ICD-10-CM | POA: Diagnosis not present

## 2024-05-01 DIAGNOSIS — H52223 Regular astigmatism, bilateral: Secondary | ICD-10-CM | POA: Diagnosis not present

## 2024-05-09 DIAGNOSIS — Z713 Dietary counseling and surveillance: Secondary | ICD-10-CM | POA: Diagnosis not present

## 2024-05-09 DIAGNOSIS — M810 Age-related osteoporosis without current pathological fracture: Secondary | ICD-10-CM | POA: Diagnosis not present

## 2024-05-09 DIAGNOSIS — E46 Unspecified protein-calorie malnutrition: Secondary | ICD-10-CM | POA: Diagnosis not present

## 2024-06-20 ENCOUNTER — Ambulatory Visit: Payer: Medicare Other

## 2024-06-20 ENCOUNTER — Ambulatory Visit: Payer: Self-pay

## 2024-06-20 ENCOUNTER — Institutional Professional Consult (permissible substitution): Payer: Medicare Other | Admitting: Psychology

## 2024-06-23 ENCOUNTER — Ambulatory Visit: Payer: Medicare Other

## 2024-06-23 ENCOUNTER — Institutional Professional Consult (permissible substitution): Payer: Medicare Other | Admitting: Psychology

## 2024-06-27 ENCOUNTER — Encounter: Payer: Medicare Other | Admitting: Psychology

## 2024-06-27 DIAGNOSIS — E871 Hypo-osmolality and hyponatremia: Secondary | ICD-10-CM | POA: Diagnosis not present

## 2024-06-27 DIAGNOSIS — R4189 Other symptoms and signs involving cognitive functions and awareness: Secondary | ICD-10-CM | POA: Diagnosis not present

## 2024-06-27 DIAGNOSIS — E46 Unspecified protein-calorie malnutrition: Secondary | ICD-10-CM | POA: Diagnosis not present

## 2024-06-27 DIAGNOSIS — F411 Generalized anxiety disorder: Secondary | ICD-10-CM | POA: Diagnosis not present

## 2024-06-27 DIAGNOSIS — R42 Dizziness and giddiness: Secondary | ICD-10-CM | POA: Diagnosis not present

## 2024-07-01 ENCOUNTER — Encounter: Payer: Medicare Other | Admitting: Psychology

## 2024-07-01 ENCOUNTER — Ambulatory Visit (INDEPENDENT_AMBULATORY_CARE_PROVIDER_SITE_OTHER): Admitting: Psychology

## 2024-07-01 ENCOUNTER — Ambulatory Visit: Admitting: Psychology

## 2024-07-01 DIAGNOSIS — R4189 Other symptoms and signs involving cognitive functions and awareness: Secondary | ICD-10-CM

## 2024-07-01 DIAGNOSIS — G3184 Mild cognitive impairment, so stated: Secondary | ICD-10-CM

## 2024-07-01 NOTE — Progress Notes (Unsigned)
 NEUROPSYCHOLOGICAL EVALUATION [facility] [place]  [place] Department of Neurology  Date of Evaluation: 07/01/2024  REASON FOR REFERRAL   Barbarajean Kinzler is an 88 year old, right-handed, White female with 16 years of formal education. She was referred for neuropsychological evaluation by Camie Sevin, PA-C, to assess current neurocognitive functioning, document potential cognitive deficits, and assist with treatment planning. This is her first neuropsychological evaluation.  SUMMARY OF RESULTS   Premorbid cognitive abilities are estimated to be in the high average range based on word reading and sociodemographic factors. Relative to this baseline estimate, performance today was variable in multiple domains.  Specifically, her performance was slow on timed tasks involving visual scanning, decoding, and visual attention/discrimination. She had difficulty understanding the instructions for a task of alternating attention, which was ultimately discontinued. Other executive function measures, such as verbal abstract reasoning and phonemic fluency, were intact. Semantic fluency and confrontation naming tasks were below expectations. While visuoconstruction tasks were generally intact, performance was low on a visuoperception task. Measures of attention/working memory were intact.  Regarding learning/memory, her performance was similarly variable. She demonstrated poor encoding, recall, and recognition on a word list task. However, she encoded short stories relatively well, with recall and recognition being preserved. While she copied a complex figure adequately, she was unable to recall any details after a delay, and her recognition of those details was poor.  On self-report questionnaires, she endorsed severe symptoms of anxiety and moderate symptoms of depression.  DIAGNOSTIC IMPRESSION   Results of the current evaluation revealed widespread variability, with deficits noted across most  cognitive domains, including learning/memory, language, processing speed, and executive functioning. Although several test scores were low, her continued functional independence supports a diagnosis of mild cognitive impairment, likely at an advanced stage. The overall clinical picture--characterized by anosognosia, insidious onset, gradual progression of symptoms, poor learning/memory, and decreased access to semantic knowledge--raises concern for an underlying Alzheimer's disease process. Additional contributing factors may include mild-to-moderate cerebrovascular disease and anxiety.  Serial assessment will be beneficial in clarifying the underlying etiology, monitoring her course, and adapting the treatment plan over time.  ICD-10 Codes: G31.84 Mild cognitive impairment with memory loss  RECOMMENDATIONS   A repeat neuropsychological evaluation in 12-18 months (or sooner if functional decline is noted) is recommended.  Discuss with your neurologist the risks and benefits of starting a medication that can help slow memory decline.  A trusted family member is encouraged to provide informal oversight of medications, finances, and medical appointments. As long as the patient manages these areas without significant errors, continued independence is appropriate. Should concerns arise, increased family involvement may be warranted to ensure safety and accuracy.  Findings from this evaluation raise concern about the patient's ability to safely drive. If the patient wishes to continue driving, she is strongly advised to pursue a formalized driving evaluation. Driving evaluations can be arranged through various organizations, including:  [employer] in [place]: [phone] [facility]: [phone] [facility]: [phone] [facility]: [phone] or [phone]  Continue managing vascular risk factors through a heart healthy diet (e.g., MIND,  Mediterranean), physician-approved physical activity, and medication adherence.   Aim to participate in activities that you find enjoyable and fulfilling, whether that be hobbies, socializing with loved ones, or being outdoors. This can improve mood, increase motivation, and offer cognitive stimulation.  Consider implementing compensatory strategies to maximize independence and maintain daily functioning. Examples include:  Adhere to routine. Compensatory strategies work best when they are used consistently. Use  a planner, calendar, or white board that has the schedule and important events for the day clearly listed to reference and cross off when tasks are complete.  Ask for written information, especially if it is new or unfamiliar (e.g., information provided at a doctor's appointment).  Create an organized environment. Keep items that can be easily misplaced in a sensible location and get into the habit of always returning the items to those places. Pay attention and reduce distractions. Make a point of focusing attention on information you want to remember. One-on-one interaction is more likely to facilitate attention and minimize distraction. Make eye contact and repeat the information out loud after you hear it. Reduce interruptions or distractions especially when attempting to learn new information.  Create associations. When learning something new, think about and understand the information. Explain it in your own words or try to associate it with something you already know. Take notes to help remember important details. Evaluate goals and plan accordingly. When confronted by many different tasks, begin by making a list that prioritizes each task and estimates the time it will take to complete. Break down complicated tasks into smaller, more manageable steps. Focus on one task at a time and complete each task before starting another. Avoid multitasking.  DISPOSITION   Patient will follow up  with the referring provider, Ms. Wertman. She should return for repeat neuropsychological testing in 12-18 months to monitor her course and assist with diagnosis and treatment planning. She and her husband will be provided verbal feedback in approximately one week regarding the findings and impression during this visit.  The remainder of the report includes the details of the patient's background and a table of results from the current evaluation, which support the summary and recommendations described above.  BACKGROUND   History of Presenting Illness: The following information was obtained from a review of medical records and an interview with the patient and her husband, Norleen. Briefly, the patient presented to the emergency department in October 2024 at her stepson's request due to uncharacteristic memory loss. At the time, he reported that she was repetitively asking questions and forgetting recent events. Head CT was unremarkable. Labs showed mild hyponatremia, not considered sufficient to explain her cognitive symptoms. She later established care with Camie Sevin, PA-C, at [facility] on 10/04/2023--again at her stepson's request--due to ongoing concerns about memory impairment. She has developed anxiety lately, which seems to worsen her cognitive symptoms. Her most recent neurology visit was in June, at which time her MoCA score was 18/30. She was referred for neuropsychological evaluation accordingly.  Cognitive Functioning: During today's appointment, the patient and her husband reported observing cognitive changes over the past year. Patient stated that these changes are not drastic but noted occasional forgetfulness, such as with recipes. She endorsed difficulties with word finding and some attention issues but denied significant problems with processing speed, visuospatial skills, or executive functioning (e.g., planning, organizing, problem-solving). Her husband corroborated her report,  adding that he also believes her memory is not significantly impaired and that she does not tend to repeat herself excessively.  Physical Functioning: Patient denied difficulties with sleep initiation and maintenance. Appetite is stable. No changes to sense of taste or smell were reported. 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 She also reported reduced hearing in one ear and has an appointment scheduled this week for a hearing evaluation. She denied balance problems, falls, and  tremors.  Emotional Functioning: Patient described her recent mood as neutral and bored. She denied suicidal ideation. She recently moved to a retirement community and has started participating in activities offered there, such as watching movies and attending lectures.  Imaging: MRI of the brain (09/16/2023) documented mild-to-moderate chronic small vessel ischemia and mild cerebral volume loss.  Other Relevant Medical History: Remarkable for coronary artery disease, aortic atherosclerosis, left bundle branch block, dyslipidemia, chronic low back pain, osteoporosis, and gastroesophageal reflux disease. Please refer to the medical record for a more comprehensive problem list. No history of stroke, CNS infection, head injury, or seizure was reported.  Current Medications: Per record, acetaminophen , aspirin, docusate sodium, ezetimibe , famotidine, latanoprost, Refresh Plus eye drops, sertraline, Systane, theanine, and vitamin D3.  Functional Status: Patient independently performs all basic activities of daily living without difficulty. She continues to drive locally and denied accidents, traffic violations, and navigational difficulties. She independently manages her medications without reported errors. Her husband manages most of the finances, but she oversees her own checking account. She can prepare meals but usually eats at the caf where they reside,  except for breakfast, which they typically make at home.  Family Neurological History: Remarkable for dementia (mother) and Alzheimer's disease (paternal grandmother).  Psychiatric History: Remarkable for recent onset of anxiety symptoms, for which she has been prescribed sertraline. History of depression, anxiety, prior mental health treatment, suicidal ideation, hallucinations, and psychiatric hospitalizations was not reported.  Substance Use History: Patient denied current use of alcohol, nicotine, marijuana, and illicit substances.  Social and Developmental History: Patient was born in [place], [place]. History of perinatal complications and developmental delays was not reported. She is currently married (previously divorced x 1). She has two stepchildren from her second marriage. She lives with her husband; they recently moved to [facility] (independent living) within the past few weeks.  Educational and Occupational History: No history of childhood learning disability, special education services, or grade retention was reported. 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 She earned a bachelor's degree in religion. Prior to her retirement, she was employed in [employer].  BEHAVIORAL OBSERVATIONS   Patient arrived on time and was accompanied by her husband, Norleen. She ambulated independently and without gait disturbance. She was alert and oriented to date of birth, place, month, and year but not to age or date. She was appropriately groomed and dressed for the setting. No significant sensory or motor abnormalities were observed. Vision (with glasses) and hearing were adequate for testing purposes. Speech was of normal rate, prosody, and volume. No conversational word-finding difficulties, paraphasic errors, or dysarthria were observed. Comprehension was conversationally intact. Thought processes were linear, logical, and coherent. Thought content was organized and devoid of  delusions. Insight appeared reduced. Affect was congruent with anxious mood. She was cooperative during testing. While one embedded measure of performance validity was just below the cutoff, it was on a memory task and, given her clinical presentation and overall pattern of scores, likely reflects true memory impairment. Therefore, while some caution is warranted, the results are thought to accurately reflect her cognitive functioning at this time.  NEUROPSYCHOLOGICAL TESTING RESULTS   Tests Administered: Animal Naming Test; [place]  Verbal Learning Test Third Edition (CVLT3) - Brief Form; Controlled Oral Word Association Test (COWAT): FAS; Geriatric Anxiety Scale-10 Item (GAS-10); Geriatric Depression Scale Short Form (GDS-SF); Neuropsychological Assessment Battery (NAB) - Subtest(s): Naming Form 1; Repeatable Battery for the Assessment of Neuropsychological Status Update (RBANS Update) Form A - Subtest(s):  Figure Copy, Figure Recall, Figure Recognition; Test of Premorbid Functioning (TOPF); Trail Making Test (TMT); Wechsler Adult Intelligence Scale Fifth Edition (WAIS-5) - Subtest(s): Similarities, Clinical cytogeneticist, Digits Forward, Digit Sequencing, Coding, Symbol Search, Digits Backward; and Wechsler Memory Scale Fourth Edition (WMS-IV) - Subtest(s): Logical Memory (LM).  Test results are provided in the table below. Whenever possible, the patient's scores were compared against age-, sex-, and education-corrected normative samples. Interpretive descriptions are based on the AACN consensus conference statement on uniform labeling (Guilmette et al., 2020).  PREMORBID FUNCTIONING RAW  RANGE  TOPF 52 StdS=111 High Average  ATTENTION & WORKING MEMORY RAW  RANGE  WAIS-5 Digits Forward -- ss=13 High Average  WAIS-5 Digits Backward -- ss=10 Average  WAIS-5 Digit Sequencing -- ss=7 Low Average  PROCESSING SPEED RAW  RANGE  Trails A 110''2e T=20 Exceptionally Low  WAIS-5 Coding  -- ss=4 Below Average  WAIS-5  Symbol Search -- ss=4 Below Average  EXECUTIVE FUNCTION RAW  RANGE  Trails B D/C -- --  WAIS-5 Similarities -- ss=10 Average  COWAT Letter Fluency 14+13+12 T=48 Average  LANGUAGE RAW  RANGE  COWAT Letter Fluency 14+13+12 T=48 Average  Animal Naming Test 7 T=18 Exceptionally Low  NAB Naming Test 25/31  +1 w/PC T=34 BNL  VISUOSPATIAL RAW  RANGE  RBANS Figure Copy 19/20 ss=12 High Average  RBANS Line Orientation -- 3-9%ile Below Average to Low Average  WAIS-5 Block Design -- ss=10 Average  VERBAL LEARNING & MEMORY RAW  RANGE  CVLT3 Total 1-4 2,3,3,3 StdS=58 Exceptionally Low  CVLT3 SDFR  1/9 ss=1 Exceptionally Low  CVLT3 LDFR  0/9 ss=1 Exceptionally Low  CVLT3 LDCR  1/9 ss=1 Exceptionally Low  CVLT3 Recognition Hits 8 ss=10 Average  CVLT3 Recognition False+ 8 ss=1 Exceptionally Low  CVLT3 Discriminability -- ss=1 Exceptionally Low  CVLT3 Intrusions 1 ss=10 Average  CVLT3 Repetitions 0 ss=12 High Average  CVLT3 Forced Choice 8/9 -- BNL  WMS-IV LM-I  (4+7+1)/53 ss=6 Low Average  WMS-IV LM-II  (3+1)/39 ss=7 Low Average  WMS-IV LM Recognition  (6+11)/23 26-50%ile Average  VISUAL LEARNING & MEMORY RAW  RANGE  RBANS Figure Copy 19/20 ss=12 High Average  RBANS Figure Recall 0/20 ss=1 Exceptionally Low  RBANS Figure Recognition 2/8 1-5%ile Below Average to Exceptionally Low  QUESTIONNAIRES RAW  RANGE  GDS-SF 9 -- Moderate  GAS-10 12 -- Severe  *Note: ss = scaled score; StdS = standard score; T = t-score; C/S = corrected raw score; WNL = within normal limits; BNL= below normal limits; D/C = discontinued. Scores from skewed distributions are typically interpreted as WNL (>=16th %ile) or BNL (<16th %ile).   INFORMED CONSENT   Patient was provided with a verbal description of the nature and purpose of the neuropsychological evaluation. Also reviewed were the foreseeable risks and/or discomforts and benefits of the procedure, limits of confidentiality, and mandatory reporting requirements of  this provider. Patient was given the opportunity to have their questions answered. Oral consent to participate was provided by the patient.   This report was prepared as part of a clinical evaluation and is not intended for forensic use.  SERVICE   This evaluation was conducted by Renda Beckwith, Psy.D. In addition to time spent directly with the patient, total professional time (180 minutes) includes record review, integration of relevant medical history, test selection, interpretation of findings, and report preparation. A technician, Lonell Jude, B.S., provided testing and scoring assistance (100 minutes).  Psychiatric Diagnostic Evaluation Services (Professional): [phone] x 1 Neuropsychological Testing Evaluation Services (Professional):  [phone] x 1 Neuropsychological Testing Evaluation Services (Professional): [phone] x 2 Neuropsychological Test Administration and Scoring (Technician): [phone] x 1 Neuropsychological Test Administration and Scoring (Technician): [phone] x 2  This report was generated using voice recognition software. While this document has been carefully reviewed, transcription errors may be present. I apologize in advance for any inconvenience. Please contact me if further clarification is needed.            Renda Beckwith, Psy.D.             Neuropsychologist

## 2024-07-01 NOTE — Progress Notes (Signed)
   Psychometrician Note   Cognitive testing was administered to Julia [name] by Lonell Jude, B.S. (psychometrist) under the supervision of Dr. Renda Beckwith, Psy.D., licensed psychologist on 07/01/2024. Julia [name] did not appear overtly distressed by the testing session per behavioral observation or responses across self-report questionnaires. Rest breaks were offered.    The battery of tests administered was selected by Dr. Renda Beckwith, Psy.D. with consideration to Julia [name]'s current level of functioning, the nature of her symptoms, emotional and behavioral responses during interview, level of literacy, observed level of motivation/effort, and the nature of the referral question. This battery was communicated to the psychometrist. Communication between Dr. Renda Beckwith, Psy.D. and the psychometrist was ongoing throughout the evaluation and Dr. Renda Beckwith, Psy.D. was immediately accessible at all times. Dr. Renda Beckwith, Psy.D. provided supervision to the psychometrist on the date of this service to the extent necessary to assure the quality of all services provided.    Julia [name] will return within approximately 1-2 weeks for an interactive feedback session with Dr. Beckwith at which time her test performances, clinical impressions, and treatment recommendations will be reviewed in detail. Julia [name] understands she can contact our office should she require our assistance before this time.  A total of 100 minutes of billable time were spent face-to-face with Julia [name] by the psychometrist. This includes both test administration and scoring time. Billing for these services is reflected in the clinical report generated by Dr. Renda Beckwith, Psy.D.  This note reflects time spent with the psychometrician and does not include test scores or any clinical interpretations made by Dr. Beckwith. The full report will follow in a separate note.

## 2024-07-04 ENCOUNTER — Ambulatory Visit (INDEPENDENT_AMBULATORY_CARE_PROVIDER_SITE_OTHER): Admitting: Otolaryngology

## 2024-07-04 ENCOUNTER — Encounter (INDEPENDENT_AMBULATORY_CARE_PROVIDER_SITE_OTHER): Payer: Self-pay | Admitting: Otolaryngology

## 2024-07-04 VITALS — BP 124/77 | HR 81

## 2024-07-04 DIAGNOSIS — H903 Sensorineural hearing loss, bilateral: Secondary | ICD-10-CM | POA: Diagnosis not present

## 2024-07-04 DIAGNOSIS — H6123 Impacted cerumen, bilateral: Secondary | ICD-10-CM | POA: Diagnosis not present

## 2024-07-04 DIAGNOSIS — R42 Dizziness and giddiness: Secondary | ICD-10-CM

## 2024-07-06 DIAGNOSIS — R42 Dizziness and giddiness: Secondary | ICD-10-CM | POA: Insufficient documentation

## 2024-07-06 DIAGNOSIS — H903 Sensorineural hearing loss, bilateral: Secondary | ICD-10-CM | POA: Insufficient documentation

## 2024-07-06 NOTE — Progress Notes (Signed)
 Patient ID: Julia [name], female   DOB: 03-Feb-1936, 88 y.o.   MRN: [id number]  Follow-up: Hearing loss, recurrent cerumen impaction. New complaint: Recurrent dizziness  HPI: The patient is an 88 year old female who returns today for her follow-up evaluation.  The patient has a history of progressive hearing loss over many years.  She was noted to have bilateral high-frequency sensorineural hearing loss, secondary to presbycusis.  She also has a history of recurrent cerumen impaction.  The patient presents today with a new complaint of recurrent dizziness.  She has been symptomatic for several months.  She describes her dizziness as a lightheaded and off-balance sensation.  She denies any true spinning vertigo.  She was treated by a physical therapist approximately 2 months ago.  Currently she denies any otalgia, otorrhea, or vertigo.  Exam: General: Communicates without difficulty, well nourished, no acute distress. Head: Normocephalic, no evidence injury, no tenderness, facial buttresses intact without stepoff. Face/sinus: No tenderness to palpation and percussion. Facial movement is normal and symmetric. Eyes: PERRL, EOMI. No scleral icterus, conjunctivae clear. Neuro: CN II exam reveals vision grossly intact.  No nystagmus at any point of gaze. Ears: Auricles well formed without lesions.  Bilateral cerumen impaction.  Nose: External evaluation reveals normal support and skin without lesions.  Dorsum is intact.  Anterior rhinoscopy reveals normal mucosa over anterior aspect of inferior turbinates and intact septum.  No purulence noted. Oral:  Oral cavity and oropharynx are intact, symmetric, without erythema or edema.  Mucosa is moist without lesions. Neck: Full range of motion without pain.  There is no significant lymphadenopathy.  No masses palpable.  Thyroid  bed within normal limits to palpation.  Parotid glands and submandibular glands equal bilaterally without mass.  Trachea is midline. Neuro:  CN  2-12 grossly intact. Vestibular: No nystagmus at any point of gaze. Dix Hallpike negative. Vestibular: There is no nystagmus with pneumatic pressure on either tympanic membrane or Valsalva. The cerebellar examination is unremarkable.   Procedure: Bilateral cerumen disimpaction Anesthesia: None Description: Under the operating microscope, the cerumen is carefully removed with a combination of cerumen currette, alligator forceps, and suction catheters.  After the cerumen is removed, the TMs are noted to be normal.  No mass, erythema, or lesions. The patient tolerated the procedure well.    Assessment: 1.  Bilateral cerumen impaction.  After the disimpaction procedure, both tympanic membranes and middle ear spaces are noted to be normal. 2.  Bilateral high-frequency sensorineural hearing loss, likely secondary to presbycusis. 3.  Recurrent dizziness of unknown etiology. The possible differential diagnoses include transient BPPV, vestibular migraine, Meniere's disease, peripheral vestibular dysfunction, or other central/systemic causes.    Plan: 1.  Otomicroscopy with bilateral cerumen disimpaction. 2.  The physical exam findings are reviewed with the patient. 3.  The patient is a candidate for hearing amplification.  Hearing aid options are discussed. 4.  The pathophysiology of vestibular dysfunction and dizziness are discussed extensively with the patient. The possible differential diagnoses are reviewed. Questions are invited and answered.   5.  Continue with balance exercises at home. 6.  The patient will return for reevaluation in 4 months.

## 2024-07-14 ENCOUNTER — Ambulatory Visit (INDEPENDENT_AMBULATORY_CARE_PROVIDER_SITE_OTHER): Admitting: Psychology

## 2024-07-14 DIAGNOSIS — G3184 Mild cognitive impairment, so stated: Secondary | ICD-10-CM | POA: Diagnosis not present

## 2024-07-14 NOTE — Progress Notes (Signed)
   NEUROPSYCHOLOGY FEEDBACK SESSION [place]. [facility]  [place] Department of Neurology  Date of Feedback Session: 07/14/2024  REASON FOR REFERRAL   Julia [name] is an 88 year old, right-handed, White female with 16 years of formal education. She was referred for neuropsychological evaluation by Camie Sevin, PA-C, to assess current neurocognitive functioning, document potential cognitive deficits, and assist with treatment planning. This is her first neuropsychological evaluation.  FEEDBACK   Patient completed a comprehensive neuropsychological evaluation on 07/01/2024. Please refer to that encounter for the full report and recommendations. Briefly, results indicated widespread variability, with deficits noted across most cognitive domains, including learning/memory, language, processing speed, and executive functioning. Although several test scores were low, her continued functional independence supports a diagnosis of mild cognitive impairment, likely at an advanced stage. The overall clinical picture--characterized by anosognosia, insidious onset, gradual progression of symptoms, poor learning/memory, and decreased access to semantic knowledge--raises concern for an underlying Alzheimer's disease process. Additional contributing factors may include mild-to-moderate cerebrovascular disease and anxiety.  Today, the patient was accompanied by her husband and stepson. They were provided verbal feedback regarding the findings and impression during this visit, and their questions were answered. A copy of the report was provided at the conclusion of the visit.  DISPOSITION   Patient will follow up with the referring provider, Ms. Wertman. She should return for repeat neuropsychological testing in 12-18 months to monitor her course and assist with diagnosis and treatment planning.  SERVICE   This feedback session was conducted by Renda Beckwith, Psy.D. One unit of 03867 (40 minutes)  was billed for Dr. Beckwith' time spent in preparing, conducting, and documenting the current feedback session.  This report was generated using voice recognition software. While this document has been carefully reviewed, transcription errors may be present. I apologize in advance for any inconvenience. Please contact me if further clarification is needed.

## 2024-07-17 DIAGNOSIS — M545 Low back pain, unspecified: Secondary | ICD-10-CM | POA: Diagnosis not present

## 2024-07-17 DIAGNOSIS — M6281 Muscle weakness (generalized): Secondary | ICD-10-CM | POA: Diagnosis not present

## 2024-07-17 DIAGNOSIS — R2689 Other abnormalities of gait and mobility: Secondary | ICD-10-CM | POA: Diagnosis not present

## 2024-07-23 DIAGNOSIS — M545 Low back pain, unspecified: Secondary | ICD-10-CM | POA: Diagnosis not present

## 2024-07-23 DIAGNOSIS — M6281 Muscle weakness (generalized): Secondary | ICD-10-CM | POA: Diagnosis not present

## 2024-07-23 DIAGNOSIS — R2689 Other abnormalities of gait and mobility: Secondary | ICD-10-CM | POA: Diagnosis not present

## 2024-07-30 DIAGNOSIS — M6281 Muscle weakness (generalized): Secondary | ICD-10-CM | POA: Diagnosis not present

## 2024-07-30 DIAGNOSIS — M545 Low back pain, unspecified: Secondary | ICD-10-CM | POA: Diagnosis not present

## 2024-07-30 DIAGNOSIS — R2689 Other abnormalities of gait and mobility: Secondary | ICD-10-CM | POA: Diagnosis not present

## 2024-08-05 DIAGNOSIS — M6281 Muscle weakness (generalized): Secondary | ICD-10-CM | POA: Diagnosis not present

## 2024-08-05 DIAGNOSIS — M545 Low back pain, unspecified: Secondary | ICD-10-CM | POA: Diagnosis not present

## 2024-08-05 DIAGNOSIS — R2689 Other abnormalities of gait and mobility: Secondary | ICD-10-CM | POA: Diagnosis not present

## 2024-08-13 DIAGNOSIS — M545 Low back pain, unspecified: Secondary | ICD-10-CM | POA: Diagnosis not present

## 2024-08-13 DIAGNOSIS — M6281 Muscle weakness (generalized): Secondary | ICD-10-CM | POA: Diagnosis not present

## 2024-08-13 DIAGNOSIS — R2689 Other abnormalities of gait and mobility: Secondary | ICD-10-CM | POA: Diagnosis not present

## 2024-08-19 DIAGNOSIS — M545 Low back pain, unspecified: Secondary | ICD-10-CM | POA: Diagnosis not present

## 2024-08-19 DIAGNOSIS — R2689 Other abnormalities of gait and mobility: Secondary | ICD-10-CM | POA: Diagnosis not present

## 2024-08-19 DIAGNOSIS — M6281 Muscle weakness (generalized): Secondary | ICD-10-CM | POA: Diagnosis not present

## 2024-08-27 DIAGNOSIS — M545 Low back pain, unspecified: Secondary | ICD-10-CM | POA: Diagnosis not present

## 2024-08-27 DIAGNOSIS — R2689 Other abnormalities of gait and mobility: Secondary | ICD-10-CM | POA: Diagnosis not present

## 2024-08-27 DIAGNOSIS — M6281 Muscle weakness (generalized): Secondary | ICD-10-CM | POA: Diagnosis not present

## 2024-09-02 DIAGNOSIS — M6281 Muscle weakness (generalized): Secondary | ICD-10-CM | POA: Diagnosis not present

## 2024-09-02 DIAGNOSIS — R2689 Other abnormalities of gait and mobility: Secondary | ICD-10-CM | POA: Diagnosis not present

## 2024-09-02 DIAGNOSIS — M545 Low back pain, unspecified: Secondary | ICD-10-CM | POA: Diagnosis not present

## 2024-09-05 DIAGNOSIS — H4051X3 Glaucoma secondary to other eye disorders, right eye, severe stage: Secondary | ICD-10-CM | POA: Diagnosis not present

## 2024-09-09 DIAGNOSIS — R2689 Other abnormalities of gait and mobility: Secondary | ICD-10-CM | POA: Diagnosis not present

## 2024-09-09 DIAGNOSIS — M6281 Muscle weakness (generalized): Secondary | ICD-10-CM | POA: Diagnosis not present

## 2024-09-09 DIAGNOSIS — M545 Low back pain, unspecified: Secondary | ICD-10-CM | POA: Diagnosis not present

## 2024-09-16 DIAGNOSIS — M545 Low back pain, unspecified: Secondary | ICD-10-CM | POA: Diagnosis not present

## 2024-09-16 DIAGNOSIS — M6281 Muscle weakness (generalized): Secondary | ICD-10-CM | POA: Diagnosis not present

## 2024-09-16 DIAGNOSIS — R2689 Other abnormalities of gait and mobility: Secondary | ICD-10-CM | POA: Diagnosis not present

## 2024-09-23 DIAGNOSIS — R2689 Other abnormalities of gait and mobility: Secondary | ICD-10-CM | POA: Diagnosis not present

## 2024-09-23 DIAGNOSIS — M6281 Muscle weakness (generalized): Secondary | ICD-10-CM | POA: Diagnosis not present

## 2024-09-23 DIAGNOSIS — M545 Low back pain, unspecified: Secondary | ICD-10-CM | POA: Diagnosis not present

## 2024-09-24 DIAGNOSIS — M6281 Muscle weakness (generalized): Secondary | ICD-10-CM | POA: Diagnosis not present

## 2024-09-24 DIAGNOSIS — M545 Low back pain, unspecified: Secondary | ICD-10-CM | POA: Diagnosis not present

## 2024-09-24 DIAGNOSIS — R2689 Other abnormalities of gait and mobility: Secondary | ICD-10-CM | POA: Diagnosis not present

## 2024-10-10 ENCOUNTER — Other Ambulatory Visit: Payer: Self-pay | Admitting: Internal Medicine

## 2024-10-10 DIAGNOSIS — R42 Dizziness and giddiness: Secondary | ICD-10-CM | POA: Diagnosis not present

## 2024-10-10 DIAGNOSIS — Z23 Encounter for immunization: Secondary | ICD-10-CM | POA: Diagnosis not present

## 2024-10-10 DIAGNOSIS — R4189 Other symptoms and signs involving cognitive functions and awareness: Secondary | ICD-10-CM | POA: Diagnosis not present

## 2024-10-10 DIAGNOSIS — F419 Anxiety disorder, unspecified: Secondary | ICD-10-CM | POA: Diagnosis not present

## 2024-10-10 DIAGNOSIS — Z1231 Encounter for screening mammogram for malignant neoplasm of breast: Secondary | ICD-10-CM

## 2024-10-31 ENCOUNTER — Encounter (INDEPENDENT_AMBULATORY_CARE_PROVIDER_SITE_OTHER): Payer: Self-pay | Admitting: Otolaryngology

## 2024-10-31 ENCOUNTER — Ambulatory Visit (INDEPENDENT_AMBULATORY_CARE_PROVIDER_SITE_OTHER): Admitting: Otolaryngology

## 2024-10-31 VITALS — BP 135/81 | HR 83 | Ht 62.0 in | Wt 113.0 lb

## 2024-10-31 DIAGNOSIS — H6123 Impacted cerumen, bilateral: Secondary | ICD-10-CM | POA: Diagnosis not present

## 2024-10-31 DIAGNOSIS — H903 Sensorineural hearing loss, bilateral: Secondary | ICD-10-CM

## 2024-10-31 DIAGNOSIS — R42 Dizziness and giddiness: Secondary | ICD-10-CM | POA: Diagnosis not present

## 2024-10-31 NOTE — Progress Notes (Signed)
 Patient ID: Julia [name], female   DOB: 01/31/1936, 89 y.o.   MRN: [id number]  Follow up: Bilateral hearing loss, recurrent dizziness  History of Present Illness Julia [name] is an 89 year old female with bilateral sensorineural hearing loss who presents for evaluation of persistent hearing difficulties and dizziness.  Hearing loss is chronic. She continues to have difficulty understanding speech and frequently requests repetition. She has not used hearing aids. Despite prior manual cerumen removal, she perceives persistent bilateral aural fullness. She denies use of cotton swabs or other objects for ear cleaning.  She experiences intermittent episodes of lightheadedness, occasionally accompanied by dizziness, though she primarily describes a sensation of lightheadedness rather than vertigo. She is uncertain if dehydration contributes, and her caregiver notes suboptimal oral intake.  Currently she denies any otalgia, otorrhea, or vertigo.   Exam: General: Communicates without difficulty, well nourished, no acute distress. Head: Normocephalic, no evidence injury, no tenderness, facial buttresses intact without stepoff. Face/sinus: No tenderness to palpation and percussion. Facial movement is normal and symmetric. Eyes: PERRL, EOMI. No scleral icterus, conjunctivae clear. Neuro: CN II exam reveals vision grossly intact.  No nystagmus at any point of gaze. Ears: Auricles well formed without lesions.  Bilateral cerumen impaction.  Nose: External evaluation reveals normal support and skin without lesions.  Dorsum is intact.  Anterior rhinoscopy reveals congested mucosa over anterior aspect of inferior turbinates and intact septum.  No purulence noted. Oral:  Oral cavity and oropharynx are intact, symmetric, without erythema or edema.  Mucosa is moist without lesions. Neck: Full range of motion without pain.  There is no significant lymphadenopathy.  No masses palpable.  Thyroid  bed within normal  limits to palpation.  Parotid glands and submandibular glands equal bilaterally without mass.  Trachea is midline. Neuro:  CN 2-12 grossly intact.  Vestibular: No nystagmus at any point of gaze. Dix Hallpike negative. Vestibular: There is no nystagmus with pneumatic pressure on either tympanic membrane or Valsalva. The cerebellar examination is unremarkable.    Procedure: Bilateral cerumen disimpaction Anesthesia: None Description: Under the operating microscope, the cerumen is carefully removed with a combination of cerumen currette, alligator forceps, and suction catheters.  After the cerumen is removed, the TMs are noted to be normal.  No mass, erythema, or lesions. The patient tolerated the procedure well.   Assessment & Plan Bilateral cerumen impaction Recurrent cerumen accumulation in both ears requiring periodic manual removal. At this visit, significant cerumen was present and removed, with ear canals now clear and tympanic membranes visible.  - Otomicroscopy with bilateral cerumen disimpaction. - Advised avoidance of Q-tips or insertion of objects into the ear canal.  Bilateral high-frequency sensorineural hearing loss secondary to presbycusis Chronic bilateral high-frequency sensorineural hearing loss consistent with presbycusis. No recent worsening.  - Discussed potential benefit of hearing aids if hearing loss becomes more problematic. - Advised monitoring for worsening hearing and to consider hearing aids if communication difficulties persist.  Recurrent dizziness of unknown etiology. The possible differential diagnoses include transient BPPV, vestibular migraine, Meniere's disease, peripheral vestibular dysfunction, or other central/systemic causes.  - Continue with balance exercises at home.

## 2024-10-31 NOTE — Progress Notes (Signed)
 "   Mild cognitive impairment likely due to Alzheimer's disease   Julia [name] is a delightful 89 y.o. 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 This patient is accompanied in the office by her husband who supplements the history.  Previous records as well as any outside records available were reviewed prior to todays visit. Patient was last seen on 04/03/2024. Memory is stable.  Patient is able to participate on ADLs and continues to drive without difficulties. Mood is well-controlled.  Start donepezil  10 mg daily as directed, side effects discussed Repeat neuropsych in 9-15 months for diagnostic clarity and disease trajectory Recommend good control of cardiovascular risk factors.   Continue to control mood as per PCP.    Discussed the use of AI scribe software for clinical note transcription with the patient, who gave verbal consent to proceed.  History of Present Illness Julia [name] is an 89 year old female with mild cognitive impairment who presents for follow-up regarding memory concerns. She is accompanied by her husband, Julia [name].  She has been experiencing short-term memory issues, including difficulty recalling new information, recent conversations, and names, which causes anxiety. She manages this by writing notes but sometimes forgets her location. She reports that she does not have problems recognizing family members and does not forget their names, though she describes herself as slightly forgetful. She occasionally misplaces items, possibly exacerbated by a recent move to a new apartment in [place] in August 2025. She is still  adjusting to the new environment. Her husband, Julia [name], manages her medications and finances, with assistance from her son-in-law.  Her anxiety levels have decreased since moving, and her Zoloft dosage was increased to 10 mg several months ago. No worsening of depression, hallucinations, paranoia, or seizures. She has not experienced any falls or head injuries and is currently undergoing physical therapy twice a week to help with stability and chronic dizziness, related to a history of benign positional vertigo.  She has gained approximately five pounds since moving to [place], where meals are provided, and she reports a good appetite.   She experiences chronic back pain and is under orthopedic care, no longer on injections.  She experiences stress incontinence, using pads for management, and has constipation for which she takes Colace.  She is not currently driving, although she wants to test her driving capabilities.   Neuropsych evaluation 07/01/2024 Dr.Kdeiss  Briefly, results indicated widespread variability, with deficits noted across most cognitive domains, including learning/memory, language, processing speed, and executive functioning. Although several test scores were low, her continued functional independence supports a diagnosis of mild cognitive impairment, likely at an advanced stage. The overall clinical picture--characterized by anosognosia, insidious onset, gradual progression of symptoms, poor learning/memory, and decreased access to semantic knowledge--raises concern for an underlying Alzheimers disease process. Additional contributing factors may include mild-to-moderate cerebrovascular disease and anxiety.       Initial visit 10/04/2023    How long did patient have memory difficulties?  She is not sure when these started. Son reports that it may have been over the last year, may be worse over the last 2 months. Patient reports some difficulty remembering new information, recent  conversations, names.  She is very anxious and worry about this issue. repeats oneself?  Endorsed, asking for what objects are such as the phone, and asking about appointments Disoriented when walking into a room?  Patient denies.    Leaving objects in unusual places?  Misplaces papers and files.   Wandering behavior? Denies.   Any personality changes, or depression, anxiety? Endorsed  Lots of anxiety, I am scared to death   She is concerned about what her [place]mi friends, what do I tell them, what do I have?.  Hallucinations or paranoia? denies   Seizures? Denies.    Any sleep changes? Sleeps well. Denies vivid dreams, REM behavior or sleepwalking   Sleep apnea? Denies.   Any hygiene concerns?  Denies.   Independent of bathing and dressing? Endorsed  Does the patient need help with medications?  Patient is in charge. She may be missing doses.    Who is in charge of the finances?  Husband is in charge . Difficulty comprehending financial issues that she had done before without issues.  Any changes in appetite?  Appetite is decreased, has dropped significant amount of weight. Just not hungry. Does not drink enough water.     Patient have trouble swallowing?  Denies.   Does the patient cook? No  Any headaches?  Denies.   Chronic pain?  She has chronic back pain followed by orthopedic. She had been taking injections, placed them on hold for a while because concerns for side effects .  Ambulates with difficulty? Denies   Recent falls or head injuries? Denies.     Vision changes?  Denies any new issues  Any strokelike symptoms? Denies.  She has chronic bilateral paresthesias, with most recent EMG /NCS on September 2024 yielding normal results, negative for neuropathy Any tremors? Denies.   Any anosmia? Denies.   Any incontinence of urine? Stress incontinence, uses pads  Any bowel dysfunction? Chronic constipation       Patient lives with her husband.  History of heavy alcohol intake?  Denies.   History of heavy tobacco use? Denies.   Family history of dementia?   Mother had dementia in the early 88s, PGM AD  Does patient drive? Yes, denies getting lost   Neuropsych evaluation 07/01/2024 r. Kdeiss briefly, results indicated widespread variability, with deficits noted across most cognitive domains, including learning/memory, language, processing speed, and executive functioning. Although several test scores were low, her continued functional independence supports a diagnosis of mild cognitive impairment, likely at an advanced stage. The overall clinical picture--characterized by anosognosia, insidious onset, gradual progression of symptoms, poor learning/memory, and decreased access to semantic knowledge--raises concern for an underlying Alzheimers disease process. Additional contributing factors may include mild-to-moderate cerebrovascular disease and anxiety.     MRI brain 09/16/23 personally reviewed remarkable for chronic microvascular changes without acute findings and mild atrophy, mild cerebral volume loss.       No data to display            10/10/2023    9:00 AM 10/04/2023    9:00 AM  Montreal Cognitive Assessment   Visuospatial/ Executive (0/5) 2 1  Naming (0/3) 3 3  Attention: Read list of digits (0/2) 2 2  Attention: Read list of letters (0/1) 1 1  Attention: Serial 7 subtraction starting at 100 (0/3) 1 1  Language: Repeat phrase (0/2) 2 2  Language : Fluency (0/1) 1 1  Abstraction (0/2) 1 1  Delayed Recall (0/5) 2 2  Orientation (0/6) 4 4  Total  19 18  Adjusted Score (based on education) 19 18      Objective:    Neurological Exam:    VITALS:   Vitals:   11/04/24 1042  BP: (!) 143/86  Pulse: 79  SpO2: 97%  Weight: 110 lb 3.2 oz (50 kg)    GEN:  The patient appears stated age and is in NAD. HEENT:  Normocephalic, atraumatic.   Neurological examination:  General: NAD, well-groomed, appears stated age. Orientation: The patient is alert.  Oriented to person, place and not to date Cranial nerves: There is good facial symmetry, anxious appearing.  The speech is fluent and clear. No aphasia or dysarthria. Fund of knowledge is appropriate. Recent and remote memory are impaired. Attention and concentration are reduced. Able to name objects and repeat phrases.  Hearing is intact to conversational tone.   Sensation: Sensation is intact to light touch throughout Motor: Strength is at least antigravity x4. DTR's 2/4 in UE/LE     Movement examination:  Tone: There is normal tone in the UE/LE Abnormal movements:  no tremor.  No myoclonus.  No asterixis.   Coordination:  There is no decremation with RAM's. Normal finger to nose  Gait and Station: The patient has no difficulty arising out of a deep-seated chair without the use of the hands. The patient's stride length is good.  Gait is cautious and narrow.    Thank you for allowing us  the opportunity to participate in the care of this nice patient. Please do not hesitate to contact us  for any questions or concerns.   Total time spent on today's visit was 33 minutes dedicated to this patient today, preparing to see patient, examining the patient, ordering tests and/or medications and counseling the patient, documenting clinical information in the EHR or other health record, independently interpreting results and communicating results to the patient/family, discussing treatment and goals, answering patient's questions and coordinating care.  Cc:  Dwight Trula SQUIBB, MD  Camie Sevin 11/04/2024 12:43 PM      "

## 2024-11-04 ENCOUNTER — Ambulatory Visit (INDEPENDENT_AMBULATORY_CARE_PROVIDER_SITE_OTHER): Admitting: Physician Assistant

## 2024-11-04 VITALS — BP 143/86 | HR 79 | Wt 110.2 lb

## 2024-11-04 DIAGNOSIS — G3184 Mild cognitive impairment, so stated: Secondary | ICD-10-CM

## 2024-11-04 MED ORDER — DONEPEZIL HCL 10 MG PO TABS: ORAL_TABLET | ORAL | 3 refills | Status: AC

## 2024-11-04 NOTE — Patient Instructions (Addendum)
 It was a pleasure to see you today at our office.   Recommendations:    Follow up in  July 23,  11:30 am  Start Donepezil  10 mg :Take half tablet (5 mg) daily for 2 weeks, then increase to the full tablet at 10 mg daily.    Continue anxiety medicine    For psychiatric meds, mood meds: Please have your primary care physician manage these medications.  If you have any severe symptoms of a stroke, or other severe issues such as confusion,severe chills or fever, etc call 911 or go to the ER as you may need to be evaluated further    For assessment of decision of mental capacity and competency:  Call Dr. Rosaline Nine, geriatric psychiatrist at [phone]  Counseling regarding caregiver distress, including caregiver depression, anxiety and issues regarding community resources, adult day care programs, adult living facilities, or memory care questions:  please contact your  Primary Doctor's Social Worker   Whom to call: Memory  decline, memory medications: Call our office [phone]    [email or web address]   RECOMMENDATIONS FOR ALL PATIENTS WITH MEMORY PROBLEMS: 1. Continue to exercise (Recommend 30 minutes of walking everyday, or 3 hours every week) 2. Increase social interactions - continue going to [place] and enjoy social gatherings with friends and family 3. Eat healthy, avoid fried foods and eat more fruits and vegetables 4. Maintain adequate blood pressure, blood sugar, and blood cholesterol level. Reducing the risk of stroke and cardiovascular disease also helps promoting better memory. 5. Avoid stressful situations. Live a simple life and avoid aggravations. Organize your time and prepare for the next day in anticipation. 6. Sleep well, avoid any interruptions of sleep and avoid any distractions in the bedroom that may interfere with adequate sleep quality 7. Avoid sugar, avoid sweets as there is a strong link between  excessive sugar intake, diabetes, and cognitive impairment We discussed the Mediterranean diet, which has been shown to help patients reduce the risk of progressive memory disorders and reduces cardiovascular risk. This includes eating fish, eat fruits and green leafy vegetables, nuts like almonds and hazelnuts, walnuts, and also use olive oil. Avoid fast foods and fried foods as much as possible. Avoid sweets and sugar as sugar use has been linked to worsening of memory function.  There is always a concern of gradual progression of memory problems. If this is the case, then we may need to adjust level of care according to patient needs. Support, both to the patient and caregiver, should then be put into place.      DRIVING: Regarding driving, in patients with progressive memory problems, driving will be impaired. We advise to have someone else do the driving if trouble finding directions or if minor accidents are reported. Independent driving assessment is available to determine safety of driving.   If you are interested in the driving assessment, you can contact the following:  [employer] in [place] at [place] [phone]  [facility] [phone]  [facility] [phone]  [facility] [phone] or [phone]   FALL PRECAUTIONS: Be cautious when walking. Scan the area for obstacles that may increase the risk of trips and falls. When getting up in the mornings, sit up at the edge of the bed for a few minutes before getting out of bed. Consider elevating the bed at the head end to avoid drop of blood pressure when getting up. Walk always in a well-lit room (use night lights in the walls). Avoid area rugs or  power cords from appliances in the middle of the walkways. Use a walker or a cane if necessary and consider physical therapy for balance exercise. Get your eyesight checked regularly.  FINANCIAL OVERSIGHT: Supervision, especially oversight when  making financial decisions or transactions is also recommended.  HOME SAFETY: Consider the safety of the kitchen when operating appliances like stoves, microwave oven, and blender. Consider having supervision and share cooking responsibilities until no longer able to participate in those. Accidents with firearms and other hazards in the house should be identified and addressed as well.   ABILITY TO BE LEFT ALONE: If patient is unable to contact 911 operator, consider using LifeLine, or when the need is there, arrange for someone to stay with patients. Smoking is a fire hazard, consider supervision or cessation. Risk of wandering should be assessed by caregiver and if detected at any point, supervision and safe proof recommendations should be instituted.  MEDICATION SUPERVISION: Inability to self-administer medication needs to be constantly addressed. Implement a mechanism to ensure safe administration of the medications.      Mediterranean Diet A Mediterranean diet refers to food and lifestyle choices that are based on the traditions of countries located on the [employer]. This way of eating has been shown to help prevent certain conditions and improve outcomes for people who have chronic diseases, like kidney disease and heart disease. What are tips for following this plan? Lifestyle  Cook and eat meals together with your family, when possible. Drink enough fluid to keep your urine clear or pale yellow. Be physically active every day. This includes: Aerobic exercise like running or swimming. Leisure activities like gardening, walking, or housework. Get 7-8 hours of sleep each night. If recommended by your health care provider, drink red wine in moderation. This means 1 glass a day for nonpregnant women and 2 glasses a day for men. A glass of wine equals 5 oz (150 mL). Reading food labels  Check the serving size of packaged foods. For foods such as rice and pasta, the serving size  refers to the amount of cooked product, not dry. Check the total fat in packaged foods. Avoid foods that have saturated fat or trans fats. Check the ingredients list for added sugars, such as corn syrup. Shopping  At the grocery store, buy most of your food from the areas near the walls of the store. This includes: Fresh fruits and vegetables (produce). Grains, beans, nuts, and seeds. Some of these may be available in unpackaged forms or large amounts (in bulk). Fresh seafood. Poultry and eggs. Low-fat dairy products. Buy whole ingredients instead of prepackaged foods. Buy fresh fruits and vegetables in-season from local farmers markets. Buy frozen fruits and vegetables in resealable bags. If you do not have access to quality fresh seafood, buy precooked frozen shrimp or canned fish, such as tuna, salmon, or sardines. Buy small amounts of raw or cooked vegetables, salads, or olives from the deli or salad bar at your store. Stock your pantry so you always have certain foods on hand, such as olive oil, canned tuna, canned tomatoes, rice, pasta, and beans. Cooking  Cook foods with extra-virgin olive oil instead of using butter or other vegetable oils. Have meat as a side dish, and have vegetables or grains as your main dish. This means having meat in small portions or adding small amounts of meat to foods like pasta or stew. Use beans or vegetables instead of meat in common dishes like chili or lasagna. Experiment with different cooking  methods. Try roasting or broiling vegetables instead of steaming or sauteing them. Add frozen vegetables to soups, stews, pasta, or rice. Add nuts or seeds for added healthy fat at each meal. You can add these to yogurt, salads, or vegetable dishes. Marinate fish or vegetables using olive oil, lemon juice, garlic, and fresh herbs. Meal planning  Plan to eat 1 vegetarian meal one day each week. Try to work up to 2 vegetarian meals, if possible. Eat seafood 2 or  more times a week. Have healthy snacks readily available, such as: Vegetable sticks with hummus. Greek yogurt. Fruit and nut trail mix. Eat balanced meals throughout the week. This includes: Fruit: 2-3 servings a day Vegetables: 4-5 servings a day Low-fat dairy: 2 servings a day Fish, poultry, or lean meat: 1 serving a day Beans and legumes: 2 or more servings a week Nuts and seeds: 1-2 servings a day Whole grains: 6-8 servings a day Extra-virgin olive oil: 3-4 servings a day Limit red meat and sweets to only a few servings a month What are my food choices? Mediterranean diet Recommended Grains: Whole-grain pasta. Brown rice. Bulgar wheat. Polenta. Couscous. Whole-wheat bread. [place] Made[place]. Vegetables: Artichokes. Beets. Broccoli. Cabbage. Carrots. Eggplant. Green beans. Chard. Kale. Spinach. Onions. Leeks. Peas. Squash. Tomatoes. Peppers. Radishes. Fruits: Apples. Apricots. Avocado. Berries. Bananas. Cherries. Dates. Figs. Grapes. Lemons. Melon. Oranges. Peaches. Plums. Pomegranate. Meats and other protein foods: Beans. Almonds. Sunflower seeds. Pine nuts. Peanuts. Cod. Salmon. Scallops. Shrimp. Tuna. Tilapia. Clams. Oysters. Eggs. Dairy: Low-fat milk. Cheese. Greek yogurt. Beverages: Water. Red wine. Herbal tea. Fats and oils: Extra virgin olive oil. Avocado oil. Grape seed oil. Sweets and desserts: Greek yogurt with honey. Baked apples. Poached pears. Trail mix. Seasoning and other foods: Basil. Cilantro. Coriander. Cumin. Mint. Parsley. Sage. Rosemary. Tarragon. Garlic. Oregano. Thyme. Pepper. Balsalmic vinegar. Tahini. Hummus. Tomato sauce. Olives. Mushrooms. Limit these Grains: Prepackaged pasta or rice dishes. Prepackaged cereal with added sugar. Vegetables: Deep fried potatoes (french fries). Fruits: Fruit canned in syrup. Meats and other protein foods: Beef. Pork. Lamb. Poultry with skin. Hot dogs. [place]. Dairy: Ice cream. Sour cream. Whole milk. Beverages: Juice.  Sugar-sweetened soft drinks. Beer. Liquor and spirits. Fats and oils: Butter. [place] oil. Vegetable oil. Beef fat (tallow). Lard. Sweets and desserts: Cookies. Cakes. Pies. Candy. Seasoning and other foods: Mayonnaise. Premade sauces and marinades. The items listed may not be a complete list. Talk with your dietitian about what dietary choices are right for you. Summary The Mediterranean diet includes both food and lifestyle choices. Eat a variety of fresh fruits and vegetables, beans, nuts, seeds, and whole grains. Limit the amount of red meat and sweets that you eat. Talk with your health care provider about whether it is safe for you to drink red wine in moderation. This means 1 glass a day for nonpregnant women and 2 glasses a day for men. A glass of wine equals 5 oz (150 mL). This information is not intended to replace advice given to you by your health care provider. Make sure you discuss any questions you have with your health care provider. Document Released: 06/08/2016 Document Revised: 07/11/2016 Document Reviewed: 06/08/2016 Elsevier Interactive Patient Education  2017 [employer].

## 2024-11-06 ENCOUNTER — Encounter: Payer: Self-pay | Admitting: Internal Medicine

## 2024-11-07 ENCOUNTER — Ambulatory Visit
Admission: RE | Admit: 2024-11-07 | Discharge: 2024-11-07 | Disposition: A | Source: Ambulatory Visit | Attending: Internal Medicine | Admitting: Internal Medicine

## 2024-11-07 DIAGNOSIS — Z1231 Encounter for screening mammogram for malignant neoplasm of breast: Secondary | ICD-10-CM

## 2024-12-04 ENCOUNTER — Telehealth: Payer: Self-pay | Admitting: Physician Assistant

## 2024-12-04 NOTE — Telephone Encounter (Signed)
 Pt's husband is stating that Pt has been having leg pain after going to whole pill, they stopped and went back to half pill did not change the pain. Should they continue hal;f or full pill of Rx Donepezil  HCl 10 MG will be seeing PC appt on Monday

## 2025-03-06 ENCOUNTER — Ambulatory Visit (INDEPENDENT_AMBULATORY_CARE_PROVIDER_SITE_OTHER): Admitting: Otolaryngology

## 2025-05-21 ENCOUNTER — Ambulatory Visit: Payer: Self-pay | Admitting: Physician Assistant

## 2025-08-05 ENCOUNTER — Institutional Professional Consult (permissible substitution): Admitting: Psychology

## 2025-08-05 ENCOUNTER — Ambulatory Visit: Payer: Self-pay

## 2025-08-12 ENCOUNTER — Encounter: Admitting: Psychology
